# Patient Record
Sex: Female | Born: 1950
Health system: Southern US, Community
[De-identification: ages and names within clinical notes are randomized; demographics above are authoritative.]

## PROBLEM LIST (undated history)

## (undated) DIAGNOSIS — D126 Benign neoplasm of colon, unspecified: Secondary | ICD-10-CM

## (undated) DIAGNOSIS — H6690 Otitis media, unspecified, unspecified ear: Secondary | ICD-10-CM

## (undated) DIAGNOSIS — H3530 Unspecified macular degeneration: Secondary | ICD-10-CM

## (undated) DIAGNOSIS — J45909 Unspecified asthma, uncomplicated: Secondary | ICD-10-CM

## (undated) DIAGNOSIS — G4730 Sleep apnea, unspecified: Secondary | ICD-10-CM

## (undated) DIAGNOSIS — R011 Cardiac murmur, unspecified: Secondary | ICD-10-CM

## (undated) DIAGNOSIS — M1990 Unspecified osteoarthritis, unspecified site: Secondary | ICD-10-CM

## (undated) DIAGNOSIS — K5190 Ulcerative colitis, unspecified, without complications: Secondary | ICD-10-CM

## (undated) DIAGNOSIS — K219 Gastro-esophageal reflux disease without esophagitis: Secondary | ICD-10-CM

## (undated) DIAGNOSIS — D649 Anemia, unspecified: Secondary | ICD-10-CM

## (undated) DIAGNOSIS — I10 Essential (primary) hypertension: Secondary | ICD-10-CM

## (undated) DIAGNOSIS — B9689 Other specified bacterial agents as the cause of diseases classified elsewhere: Secondary | ICD-10-CM

## (undated) DIAGNOSIS — C801 Malignant (primary) neoplasm, unspecified: Secondary | ICD-10-CM

## (undated) DIAGNOSIS — H269 Unspecified cataract: Secondary | ICD-10-CM

## (undated) DIAGNOSIS — R0789 Other chest pain: Secondary | ICD-10-CM

## (undated) DIAGNOSIS — E119 Type 2 diabetes mellitus without complications: Secondary | ICD-10-CM

## (undated) HISTORY — DX: Benign neoplasm of colon, unspecified: D12.6

## (undated) HISTORY — DX: Other chest pain: R07.89

## (undated) HISTORY — PX: TRIGGER FINGER RELEASE: SHX641

## (undated) HISTORY — DX: Unspecified cataract: H26.9

## (undated) HISTORY — DX: Essential (primary) hypertension: I10

## (undated) HISTORY — DX: Sleep apnea, unspecified: G47.30

## (undated) HISTORY — DX: Unspecified asthma, uncomplicated: J45.909

## (undated) HISTORY — PX: TONSILLECTOMY: SUR1361

## (undated) HISTORY — PX: CATARACT EXTRACTION W/ INTRAOCULAR LENS  IMPLANT, BILATERAL: SHX1307

## (undated) HISTORY — DX: Ulcerative colitis, unspecified, without complications: K51.90

## (undated) HISTORY — DX: Gastro-esophageal reflux disease without esophagitis: K21.9

---

## 1998-12-18 HISTORY — PX: CHOLECYSTECTOMY: SHX55

## 1999-04-11 ENCOUNTER — Emergency Department (HOSPITAL_COMMUNITY): Admission: EM | Admit: 1999-04-11 | Discharge: 1999-04-11 | Payer: Self-pay | Admitting: Emergency Medicine

## 1999-04-11 ENCOUNTER — Encounter: Payer: Self-pay | Admitting: Emergency Medicine

## 1999-05-19 ENCOUNTER — Observation Stay (HOSPITAL_COMMUNITY): Admission: RE | Admit: 1999-05-19 | Discharge: 1999-05-20 | Payer: Self-pay | Admitting: General Surgery

## 1999-06-03 ENCOUNTER — Ambulatory Visit (HOSPITAL_COMMUNITY): Admission: RE | Admit: 1999-06-03 | Discharge: 1999-06-03 | Payer: Self-pay | Admitting: General Surgery

## 1999-06-03 ENCOUNTER — Inpatient Hospital Stay (HOSPITAL_COMMUNITY): Admission: EM | Admit: 1999-06-03 | Discharge: 1999-06-07 | Payer: Self-pay

## 1999-06-04 ENCOUNTER — Encounter: Payer: Self-pay | Admitting: *Deleted

## 2004-10-10 ENCOUNTER — Ambulatory Visit (HOSPITAL_COMMUNITY): Admission: RE | Admit: 2004-10-10 | Discharge: 2004-10-10 | Payer: Self-pay | Admitting: Internal Medicine

## 2004-10-26 ENCOUNTER — Ambulatory Visit: Payer: Self-pay | Admitting: Gastroenterology

## 2004-11-08 ENCOUNTER — Ambulatory Visit: Payer: Self-pay | Admitting: Gastroenterology

## 2004-11-15 ENCOUNTER — Ambulatory Visit: Payer: Self-pay | Admitting: Gastroenterology

## 2004-12-07 ENCOUNTER — Ambulatory Visit: Payer: Self-pay | Admitting: Gastroenterology

## 2005-11-30 ENCOUNTER — Ambulatory Visit (HOSPITAL_COMMUNITY): Admission: RE | Admit: 2005-11-30 | Discharge: 2005-11-30 | Payer: Self-pay | Admitting: Orthopedic Surgery

## 2005-11-30 HISTORY — PX: DE QUERVAIN'S RELEASE: SHX1439

## 2007-01-16 ENCOUNTER — Ambulatory Visit: Payer: Self-pay | Admitting: Gastroenterology

## 2007-01-16 LAB — CONVERTED CEMR LAB
Basophils Absolute: 0.1 10*3/uL (ref 0.0–0.1)
Basophils Relative: 1 % (ref 0.0–1.0)
Eosinophils Absolute: 0.2 10*3/uL (ref 0.0–0.6)
Eosinophils Relative: 2.2 % (ref 0.0–5.0)
Folate: 7.9 ng/mL
HCT: 33.9 % — ABNORMAL LOW (ref 36.0–46.0)
Hemoglobin: 11.2 g/dL — ABNORMAL LOW (ref 12.0–15.0)
Iron: 26 ug/dL — ABNORMAL LOW (ref 42–145)
Lymphocytes Relative: 19.1 % (ref 12.0–46.0)
MCHC: 33.1 g/dL (ref 30.0–36.0)
MCV: 69.1 fL — ABNORMAL LOW (ref 78.0–100.0)
Monocytes Absolute: 0.6 10*3/uL (ref 0.2–0.7)
Monocytes Relative: 6.3 % (ref 3.0–11.0)
Neutro Abs: 6.3 10*3/uL (ref 1.4–7.7)
Neutrophils Relative %: 71.4 % (ref 43.0–77.0)
Platelets: 333 10*3/uL (ref 150–400)
RBC: 4.9 M/uL (ref 3.87–5.11)
RDW: 15.8 % — ABNORMAL HIGH (ref 11.5–14.6)
Saturation Ratios: 5.4 % — ABNORMAL LOW (ref 20.0–50.0)
Transferrin: 346.4 mg/dL (ref >=212.0)
Vitamin B-12: 376 pg/mL (ref 211–911)
WBC: 8.9 10*3/uL (ref 4.5–10.5)

## 2007-01-22 ENCOUNTER — Encounter (INDEPENDENT_AMBULATORY_CARE_PROVIDER_SITE_OTHER): Payer: Self-pay | Admitting: Gastroenterology

## 2007-01-28 ENCOUNTER — Ambulatory Visit: Payer: Self-pay | Admitting: Internal Medicine

## 2007-02-07 ENCOUNTER — Ambulatory Visit: Payer: Self-pay | Admitting: Internal Medicine

## 2007-03-18 ENCOUNTER — Ambulatory Visit: Payer: Self-pay | Admitting: Internal Medicine

## 2007-03-18 LAB — CONVERTED CEMR LAB
Basophils Absolute: 0.1 10*3/uL (ref 0.0–0.1)
Basophils Relative: 0.9 % (ref 0.0–1.0)
Eosinophils Absolute: 0.2 10*3/uL (ref 0.0–0.6)
Eosinophils Relative: 2.2 % (ref 0.0–5.0)
HCT: 39.9 % (ref 36.0–46.0)
Hemoglobin: 13.3 g/dL (ref 12.0–15.0)
Lymphocytes Relative: 22.2 % (ref 12.0–46.0)
MCHC: 33.4 g/dL (ref 30.0–36.0)
MCV: 74 fL — ABNORMAL LOW (ref 78.0–100.0)
Monocytes Absolute: 0.6 10*3/uL (ref 0.2–0.7)
Monocytes Relative: 6.9 % (ref 3.0–11.0)
Neutro Abs: 6.1 10*3/uL (ref 1.4–7.7)
Neutrophils Relative %: 67.8 % (ref 43.0–77.0)
Platelets: 291 10*3/uL (ref 150–400)
RBC: 5.38 M/uL — ABNORMAL HIGH (ref 3.87–5.11)
RDW: 20.5 % — ABNORMAL HIGH (ref 11.5–14.6)
WBC: 9 10*3/uL (ref 4.5–10.5)

## 2007-05-15 ENCOUNTER — Ambulatory Visit: Payer: Self-pay | Admitting: Internal Medicine

## 2007-05-15 LAB — CONVERTED CEMR LAB: Tissue Transglutaminase Ab, IgA: <3 units (ref <5)

## 2007-05-16 ENCOUNTER — Encounter: Payer: Self-pay | Admitting: Internal Medicine

## 2007-05-16 ENCOUNTER — Ambulatory Visit: Payer: Self-pay | Admitting: Internal Medicine

## 2007-05-16 DIAGNOSIS — K449 Diaphragmatic hernia without obstruction or gangrene: Secondary | ICD-10-CM | POA: Insufficient documentation

## 2007-05-24 ENCOUNTER — Ambulatory Visit (HOSPITAL_COMMUNITY): Admission: RE | Admit: 2007-05-24 | Discharge: 2007-05-24 | Payer: Self-pay | Admitting: Internal Medicine

## 2007-06-12 ENCOUNTER — Ambulatory Visit: Payer: Self-pay | Admitting: Internal Medicine

## 2007-06-12 LAB — CONVERTED CEMR LAB
Basophils Absolute: 0 10*3/uL (ref 0.0–0.1)
Basophils Relative: 0.5 % (ref 0.0–1.0)
Eosinophils Absolute: 0.1 10*3/uL (ref 0.0–0.6)
Eosinophils Relative: 1 % (ref 0.0–5.0)
Ferritin: 204.2 ng/mL (ref 10.0–291.0)
HCT: 43.1 % (ref 36.0–46.0)
Hemoglobin: 14.3 g/dL (ref 12.0–15.0)
Iron: 49 ug/dL (ref 42–145)
Lymphocytes Relative: 18 % (ref 12.0–46.0)
MCHC: 33.2 g/dL (ref 30.0–36.0)
MCV: 80 fL (ref 78.0–100.0)
Monocytes Absolute: 0.4 10*3/uL (ref 0.2–0.7)
Monocytes Relative: 5.7 % (ref 3.0–11.0)
Neutro Abs: 5.7 10*3/uL (ref 1.4–7.7)
Neutrophils Relative %: 74.8 % (ref 43.0–77.0)
Platelets: 234 10*3/uL (ref 150–400)
RBC: 5.4 M/uL — ABNORMAL HIGH (ref 3.87–5.11)
RDW: 17.1 % — ABNORMAL HIGH (ref 11.5–14.6)
Saturation Ratios: 14.7 % — ABNORMAL LOW (ref 20.0–50.0)
Transferrin: 238.7 mg/dL (ref >=212.0)
WBC: 7.6 10*3/uL (ref 4.5–10.5)

## 2008-04-10 DIAGNOSIS — Z862 Personal history of diseases of the blood and blood-forming organs and certain disorders involving the immune mechanism: Secondary | ICD-10-CM | POA: Insufficient documentation

## 2008-04-10 DIAGNOSIS — G4730 Sleep apnea, unspecified: Secondary | ICD-10-CM | POA: Insufficient documentation

## 2008-04-10 DIAGNOSIS — K5100 Ulcerative (chronic) pancolitis without complications: Secondary | ICD-10-CM | POA: Insufficient documentation

## 2008-12-10 ENCOUNTER — Emergency Department (HOSPITAL_COMMUNITY): Admission: EM | Admit: 2008-12-10 | Discharge: 2008-12-10 | Payer: Self-pay | Admitting: Family Medicine

## 2009-09-22 ENCOUNTER — Encounter (INDEPENDENT_AMBULATORY_CARE_PROVIDER_SITE_OTHER): Payer: Self-pay | Admitting: *Deleted

## 2009-12-16 ENCOUNTER — Ambulatory Visit (HOSPITAL_BASED_OUTPATIENT_CLINIC_OR_DEPARTMENT_OTHER): Admission: RE | Admit: 2009-12-16 | Discharge: 2009-12-16 | Payer: Self-pay | Admitting: Orthopedic Surgery

## 2009-12-16 HISTORY — PX: CARPAL TUNNEL RELEASE: SHX101

## 2010-04-18 ENCOUNTER — Telehealth: Payer: Self-pay | Admitting: Internal Medicine

## 2011-01-17 NOTE — Letter (Signed)
Summary: Colonoscopy Letter  [facility]  [address] [place], [place] [address]   Phone: [phone]  Fax: [phone]      September 22, 2009 MRN: [id number]   Shawna Gonzalez [address] [place], [place]  [address]   Dear Ms. Bourdeau,   According to your medical record, it is time for you to schedule a Colonoscopy. The American Cancer Society recommends this procedure as a method to detect early colon cancer. Patients with a family history of colon cancer, or a personal history of colon polyps or inflammatory bowel disease are at increased risk.  This letter has beeen generated based on the recommendations made at the time of your procedure. If you feel that in your particular situation this may no longer apply, please contact our office.  Please call our office at [phone] to schedule this appointment or to update your records at your earliest convenience.  Thank you for cooperating with [place] to provide you with the very best care possible.   Sincerely,  Hedwig Morton. Juanda Chance, M.D.  [facility] [phone]

## 2011-01-17 NOTE — Procedures (Signed)
Summary: Gastroenterology Egd  Gastroenterology Egd   Imported By: June McMurray CMA 04/16/2008 13:53:27  _____________________________________________________________________  External Attachment:    Type:   Image     Comment:   External Document

## 2011-01-17 NOTE — Procedures (Signed)
Summary: Gastroenterology Col  Gastroenterology Col   Imported By: June McMurray CMA 04/16/2008 13:52:38  _____________________________________________________________________  External Attachment:    Type:   Image     Comment:   External Document

## 2011-01-17 NOTE — Progress Notes (Signed)
Summary: Schedule colonoscopy  Phone Note Outgoing Call Call back at [facility] Phone [phone]   Call placed by: Harlow Mares CMA Duncan Dull),  Apr 18, 2010 3:38 PM Call placed to: Patient Summary of Call: pt due for colonoscopy, lm with a female she will give her a message to call back and schedule a colonoscopy.  Initial call taken by: Harlow Mares CMA Duncan Dull),  Apr 18, 2010 3:39 PM  Follow-up for Phone Call        left message with female to have patient call back. we will send patient another letter.  Follow-up by: Harlow Mares CMA Duncan Dull),  Apr 29, 2010 2:43 PM

## 2011-03-20 LAB — POCT HEMOGLOBIN-HEMACUE: Hemoglobin: 14.1 g/dL (ref 12.0–15.0)

## 2011-03-31 ENCOUNTER — Ambulatory Visit (HOSPITAL_COMMUNITY): Payer: PRIVATE HEALTH INSURANCE | Attending: Internal Medicine

## 2011-03-31 DIAGNOSIS — M810 Age-related osteoporosis without current pathological fracture: Secondary | ICD-10-CM | POA: Insufficient documentation

## 2011-05-02 NOTE — Assessment & Plan Note (Signed)
[facility]                         GASTROENTEROLOGY OFFICE NOTE   MATSUKO, KRETZ                   MRN:          [id number]  DATE:06/12/2007                            DOB:          10-15-1951    Ms. Shawna Gonzalez is a 60 year old white female with iron deficiency  anemia. We have recently done a work up on her which included upper  endoscopy and small bowel biopsy. There is no evidence of malabsorption.  No evidence of sprue. She had a small hiatal hernia. Patient has  received not only oral iron but also iron infusion on May 27, 2007. She  denies any rectal bleeding. There is a questionable history of  ulcerative colitis on colonoscopy by Dr. Corinda Gubler in November 2005.  Interestingly patient had a colonoscopy at that time because of acute  illness resulting in diarrhea. CT scan of the abdomen at that time  showed diffuse thickening of the colon suggestive of either infectious  colitis, or inflammatory bowel disease. On colonoscopy she had inflamed  colonic mucosa which Dr. Corinda Gubler labeled as ulcerative colitis but the  actual biopsies did not show any inflammation. She since then has not  had any problems with her lower GI tract. Her bowel habits are rather  frequent, having 3 to 4 formed bowel movements a day, sometimes loose  stools. She attributes this to cholecystectomy, post cholecystectomy  diarrhea. She denies any rectal bleeding.   PHYSICAL EXAMINATION:  Blood pressure 142/80, pulse 68, and weight 182  pounds. She was alert, oriented, no distress.  LUNGS: Clear to auscultation.  COR: Normal S1, S2.  ABDOMEN: Soft, obese, tender in left lower quadrant. Liver edge at  costal margin.  RECTAL EXAM: Reveals no stool in the vault. Mucus was Hemoccult  negative.   IMPRESSION:  A 60 year old white female with iron deficiency anemia  which was partly caused be being a blood donor. We are looking for other  factors causing her iron  deficiency. So far I have not found any. We  will rule out ulcerative colitis.   PLAN:  Repeat CBC today, as well as iron studies, and if her iron and  blood counts are normal today, no further evaluation is needed. If they  are still low I would probably consider doing colonoscopy and rebiopsy  her to make sure she does not have ulcerative colitis.     Hedwig Morton. Juanda Chance, MD  Electronically Signed    DMB/MedQ  DD: 06/12/2007  DT: 06/12/2007  Job #: [id number]   cc:   Loraine Leriche A. Perini, M.D.

## 2011-05-02 NOTE — Assessment & Plan Note (Signed)
[facility]                         GASTROENTEROLOGY OFFICE NOTE   Shawna Gonzalez, Shawna Gonzalez                   MRN:          [id number]  DATE:05/15/2007                            DOB:          [birth year]    Shawna Gonzalez is a very nice 60 year old patient of Dr. Waynard Edwards who has  iron deficiency.  We saw her in February 2008 and asked her to be a  blood donor to avoid further iron deficiency.  She continued Repliva  iron supplements daily, but her iron saturation rose only from 5% up to  9%.  Her Hemoccult cards were never done as we requested.  Her  hemoglobin has come up from 11 up to 13; it was 11.5 and hematocrit 36.4  with a low MCV of 71.3 earlier, but most recently on Apr 19, 2007  hemoglobin was up to 13.8, hematocrit 41.5 with MCV of up to 75.6.  The  patient has crampy abdominal pain and frequent stools.  Last endoscopic  exam and colonoscopy was done in November 2005 and showed small gastric  polyps and a question of a colitis of the left colon which did not  demonstrate any inflammatory changes on the biopsies.   MEDICATIONS:  1. Fosamax 70 mg weekly.  2. Travatan 1 p.o. daily.  3. Atenolol 1 p.o. daily.  4. Vitamin D 50,000 units 3 times a week.  5. Over the counter iron one a day.  6. Nexium 40 mg a day.  7. Aspirin 81 mg p.o. daily.   PHYSICAL EXAMINATION:  Blood pressure 140/84, pulse 72 and weight 182  pounds.  She has lost about 20 pounds since January of this year.  She  appeared pale, overweight, alert and oriented.  LUNGS:  Clear to auscultation.  COR:  With normal S1, normal S2.  ABDOMEN:  Protuberant but soft, post cholecystectomy scars, no  tenderness, liver edge at costal margin.  Lower abdomen was  unremarkable.  Minimal tenderness in left lower quadrant.  RECTAL:  Today shows soft Hemoccult negative stool.   IMPRESSION:  A 60 year old white female with persistent iron deficiency  but Hemoccult negative stools.  She at  one point was a blood donor and  we have attributed the severe iron deficiency to the phlebotomies, but  she really has not been able to bring up her iron saturation on iron  supplements because either she needs to take her iron for longer period  of time or she possibly has some absorption of the iron.   PLAN:  1. To rule our celiac sprue or some other villous atrophy of her small      intestine we would try to do a small bowel biopsy and also do a      tissue transglutaminase level.  2. Upper endoscopy with small bowel biopsy scheduled.  3. Depending on the small bowel biopsy, she will likely need an iron      infusion.  4. Depending on the upper endoscopy and small bowel biopsy consider      repeat colonoscopy and biopsies from the colon.     Hedwig Morton. Juanda Chance, MD  Electronically Signed    DMB/MedQ  DD: 05/15/2007  DT: 05/15/2007  Job #: [id number]   cc:   Loraine Leriche A. Perini, M.D.

## 2011-05-05 NOTE — Assessment & Plan Note (Signed)
[facility]                         GASTROENTEROLOGY OFFICE NOTE   Shawna Gonzalez, SPAN                   MRN:          [id number]  DATE:02/07/2007                            DOB:          Jan 07, 1951    Shawna Gonzalez is a 60 year old black female seen in the absence of Dr.  Corinda Gubler for evaluation of severe anemia, with microcytic indices.  Hemoglobin on January 28, 2007 was 11.2, hematocrit 33.9, with an MCV  of 69, iron saturation 5.4%, with normal B12 and folate levels.  Upper  abdominal ultrasound on January 28, 2007 showed post cholecystectomy  state, otherwise normal exam.  The patient has been followed by Dr.  Corinda Gubler for several years.  She has a history of acute colitis in 2005  which showed on CT scan of the abdomen on October 10, 2004 as defuse  wall thickening along the left colon.  Subsequent colonoscopy on  November 15, 2004 showed question of left-sided colitis.  Biopsies  however did not show any evidence of colitis.  They showed normal  mucosa.  The patient since then has not had any lower GI problems.  Her  level of energy has been slowing decreasing.  Upper endoscopy on  November 15, 2004 showed small hiatal hernia.  The patient gives blood  frequently.  Over past 5 to 6 years she has given about 9 to 10 units of  blood.  The last time she gave 1 pint of blood was first week of  November 2007, prior to that 6 months ago.  When she went to give blood  again in January she was turned down because of the anemia.  Her last  menstrual period was about 2 years ago.  She has never been anemic and  never took any iron pills until several weeks ago when Dr. Corinda Gubler asked  her to start taking Repliva 1 p.o. daily.  She has had some craving for  ice.  She denies dysphagia, odynophagia, abdominal pain.  Her bowel  habits are regular.  There is no family history of colon cancer.  Stool  for ONP several weeks ago was negative.   MEDICATIONS:  1. Aspirin 81 mg p.o. daily.  2. Nexium 40 mg p.o. daily.  3. Caltrate.   PHYSICAL EXAMINATION:  Blood pressure 102/68, pulse 72, weight 198  pounds.  She was alert and oriented, in no distress.  No kyllosis.  Tongue was papillated.  Sclerae nonicteric.  LUNGS:  Clear to auscultation.  CARDIAC:  Quiet precordium, normal S1 and S2.  ABDOMEN:  Soft, nontender, obese.  Liver edge at costal margin.  Normoactive bowel sounds.  RECTAL EXAM:  With normoactive tone, hemoccult negative stool.   IMPRESSION:  A 60 year old black female with severe microcytic anemia  but hemoccult negative stool.  I think her anemia is due to excessive  blood donation which she has done 9 to 10 times in the last several  years.  She was finally turned down because of low blood count.  She is  postmenopausal but might have had chronic iron deficiency prior to her  menopause.  There was a questionable  history of acute colitis which  however has resolved and was never confirmed on biopsies.   PLAN:  1. I have asked the patient not to be a blood donner  2. Continue Repliva 1 p.o. daily.  3. Repeat H&H in 6 weeks.  4. Hemoccult cards.  If these are positive she will need another      endoscopy and colonoscopy.  If they are negative I would just      continue to follow her H&H.     Hedwig Morton. Juanda Chance, MD  Electronically Signed    DMB/MedQ  DD: 02/07/2007  DT: 02/07/2007  Job #: [id number]   cc:   Kerry Kass, M.D. [facility] A. Perini, M.D.

## 2011-05-05 NOTE — Assessment & Plan Note (Signed)
[facility]                         GASTROENTEROLOGY OFFICE NOTE   Shawna Gonzalez, Shawna Gonzalez                   MRN:          [id number]  DATE:01/16/2007                            DOB:          1951/01/06    This patient of Dr. Laurey Morale was referred because of some recent  findings of anemia with a hemoglobin dropping from 14 to 11 over the  past year. It is interesting that she does not see any evidence of blood  and I worked her up quite extensively several years ago with an upper  endoscopy and a colonoscopic examination. The colonoscopy revealed some  questionable slight mild inflammatory process involving the left colon.  The biopsies were not consistent with any real significant pathology.  The upper endoscopy revealed a small hiatal hernia approximately 2 cm  with a questionable short segment Barrett's. The stomach revealed some  small benign antral polyps but otherwise was negative.   PHYSICAL EXAMINATION:  VITAL SIGNS: Today she weighed 207. Blood  pressure 120/70, pulse 78 and regular.  NECK: Carotid upstrokes are unremarkable.   IMPRESSION:  1. Anemia of questionable etiology.  2. Allergies.  3. History of sleep apnea.  4. Status post cholecystectomy.  5. Gastric polyps.  6. Questionable colitis.  7. GERD.  8. Moderate obesity.   RECOMMENDATIONS:  Get stools for screening. Get CBC, serum iron binding  capacity, B12 and folate levels. She is presently taking iron, we will  discontinue this 5 days prior to the Hemoccults. If there is evidence of  positive stools or significant iron deficiency, then I think we probably  will be expected to evaluate her endoscopically to see if we can find  any significant evidence for her anemia. She also might need a capsule  endoscopy to be complete if the other endoscopic studies are not  definitive.     Ulyess Mort, MD  Electronically Signed    SML/MedQ  DD: 01/16/2007  DT: 01/16/2007   Job #: [id number]   cc:   Loraine Leriche A. Perini, M.D.

## 2011-05-05 NOTE — Op Note (Signed)
NAMEM[facility]land Kitchen  Shawna Gonzalez, Shawna Gonzalez NO.:  [id number]   MEDICAL RECORD NO.:  [id number]          PATIENT TYPE:  AMB   LOCATION:  DAY                          FACILITY:  [facility]   PHYSICIAN:  Vania Rea. Supple, M.D.  DATE OF BIRTH:  Nov 18, 1951   DATE OF PROCEDURE:  11/30/2005  DATE OF DISCHARGE:                                 OPERATIVE REPORT   PREOPERATIVE DIAGNOSIS:  Right wrist De Quervain's stenosing tenosynovitis.   POSTOPERATIVE DIAGNOSIS:  Right wrist De Quervain's stenosing tenosynovitis.   PROCEDURE:  A right wrist dorsal compartment release.   SURGEON:  Vania Rea. Supple, M.D.   Threasa HeadsFrench Ana A. Shuford, P.A.-C.   ANESTHESIA:  An IV regional.   TOURNIQUET TIME:  Less than 30 minutes.   ESTIMATED BLOOD LOSS:  Minimal.   DRAINS:  None.   HISTORY:  [name] is a 59 year old female who has been followed for  right wrist pain secondary to a stenosing tenosynovitis of the first dorsal  compartment.  Her symptoms have been refractory to multiple and prolonged  attempts at conservative management.  Due to her ongoing pain and functional  limitations, she is brought to the operating room at this time for a planned  right wrist surgery, as described below.   I preoperatively counseled [name] on treatment options as well as  risks versus benefits thereof.  Possible surgical complications of bleeding,  infection, neurovascular injury, persistent pain, loss of motion, recurrence  of symptoms, and possible need for additional surgery are reviewed.  She  understands and accepts and agrees with our planned procedure.   PROCEDURE IN DETAIL:  After undergoing routing preoperative evaluation, the  patient received prophylactic antibiotics and was placed supine on the  operating room table where an IV regional anesthetic was established under  tourniquet control into the right upper extremity.  The arm was then  sterilely prepped and draped in a standard fashion.   The transverse 3 cm  incision was then made over the apex of the radial styloid.  Sharp  dissection carried down through the skin, and bipolar electrocautery was  then used for hemostasis.  A combination of sharp and blunt dissection was  then used to divide the subcutaneous tissue with care taken to protect the  neurovascular structures.  Dissection carried deeply down to the apex of the  styloid, where the sheath of the first dorsal compartment was then  identified.  This was then divided longitudinally with  a 15 blade with care  taken to confirm that the sheath had been properly released both proximally  and distally.  The contents of the first dorsal compartment were then  identified, and we confirmed that there were no accessory compartments and  that all of the tendons within the first dorsal compartment had been  appropriately decompressed.  The wound was then irrigated.  Hemostasis was  obtained.  The deep subcu layer was closed with two simple 3-0 Vicryl  sutures.  Intra-articular 3-0 Monocryl was used to close the  skin.  Then 0.25% plain Marcaine was then injected along the skin incision.  Steri-Strips applied.  A bulky dry dressing placed over the right wrist.  The tourniquet was let down, and the patient was returned to the recovery  room in stable condition.      Vania Rea. Supple, M.D.  Electronically Signed     KMS/MEDQ  D:  11/30/2005  T:  11/30/2005  Job:  [id number]

## 2011-06-07 ENCOUNTER — Other Ambulatory Visit: Payer: Self-pay | Admitting: Dermatology

## 2011-12-26 ENCOUNTER — Encounter: Payer: Self-pay | Admitting: Internal Medicine

## 2012-01-09 ENCOUNTER — Ambulatory Visit (AMBULATORY_SURGERY_CENTER): Payer: PRIVATE HEALTH INSURANCE | Admitting: *Deleted

## 2012-01-09 VITALS — Ht 59.0 in | Wt 206.0 lb

## 2012-01-09 DIAGNOSIS — Z1211 Encounter for screening for malignant neoplasm of colon: Secondary | ICD-10-CM

## 2012-01-09 DIAGNOSIS — K5100 Ulcerative (chronic) pancolitis without complications: Secondary | ICD-10-CM

## 2012-01-09 MED ORDER — PEG-KCL-NACL-NASULF-NA ASC-C 100 G PO SOLR: ORAL | Status: DC

## 2012-01-10 ENCOUNTER — Encounter: Payer: Self-pay | Admitting: Internal Medicine

## 2012-01-23 ENCOUNTER — Ambulatory Visit (AMBULATORY_SURGERY_CENTER): Payer: PRIVATE HEALTH INSURANCE | Admitting: Internal Medicine

## 2012-01-23 ENCOUNTER — Encounter: Payer: Self-pay | Admitting: Internal Medicine

## 2012-01-23 DIAGNOSIS — D126 Benign neoplasm of colon, unspecified: Secondary | ICD-10-CM

## 2012-01-23 DIAGNOSIS — K621 Rectal polyp: Secondary | ICD-10-CM

## 2012-01-23 DIAGNOSIS — K5100 Ulcerative (chronic) pancolitis without complications: Secondary | ICD-10-CM

## 2012-01-23 DIAGNOSIS — K620 Anal polyp: Secondary | ICD-10-CM

## 2012-01-23 DIAGNOSIS — Z1211 Encounter for screening for malignant neoplasm of colon: Secondary | ICD-10-CM

## 2012-01-23 HISTORY — PX: COLONOSCOPY WITH PROPOFOL: SHX5780

## 2012-01-23 MED ORDER — SODIUM CHLORIDE 0.9 % IV SOLN: 500.0000 mL | INTRAVENOUS | Status: DC

## 2012-01-23 NOTE — Op Note (Signed)
[facility] [address] [place], [place]  [address]  COLONOSCOPY PROCEDURE REPORT  PATIENT:  Shawna, Gonzalez  MR#:  [id number] BIRTHDATE:  23-Apr-1951, 61 yrs. old  GENDER:  female ENDOSCOPIST:  Hedwig Morton. Juanda Chance, MD REF. BY:  Rodrigo Ran, M.D. PROCEDURE DATE:  01/23/2012 PROCEDURE:  Colonoscopy with biopsy ASA CLASS:  Class II INDICATIONS:  last colon 10/2004 ? U.Colitis- biopsies showed normal mucosa MEDICATIONS:   MAC sedation, administered by CRNA, propofol (Diprivan) 300 mg  DESCRIPTION OF PROCEDURE:   After the risks and benefits and of the procedure were explained, informed consent was obtained. Digital rectal exam was performed and revealed no rectal masses. The LB 180AL [id number] endoscope was introduced through the anus and advanced to the cecum, which was identified by both the appendix and ileocecal valve.  The quality of the prep was good, using MoviPrep.  The instrument was then slowly withdrawn as the colon was fully examined. <<PROCEDUREIMAGES>>  FINDINGS:  Two polyps were found. at 35 cm and in the rectum 3-6 mm polyps The polyp was removed using cold biopsy forceps. Polyp was snared without cautery. Retrieval was successful (see image1, image6, image8, image7, and image9). snare polyp  Mild diverticulosis was found in the sigmoid colon (see image5).  This was otherwise a normal examination of the colon (see image2, image3, image4, and image10).   Retroflexed views in the rectum revealed no abnormalities.    The scope was then withdrawn from the patient and the procedure completed.  COMPLICATIONS:  None ENDOSCOPIC IMPRESSION: 1) Two polyps 2) Mild diverticulosis in the sigmoid colon 3) Otherwise normal examination no evidence of ulcerative colitis RECOMMENDATIONS: 1) Await pathology results 2) High fiber diet.  REPEAT EXAM:  In 5 - 7 year(s) for.  ______________________________ Hedwig Morton. Juanda Chance, MD  CC:  n. eSIGNED:   Hedwig Morton. Merrianne Mccumbers at  01/23/2012 12:24 PM  Stark Klein, [id number]

## 2012-01-23 NOTE — Patient Instructions (Signed)
FOLLOW DISCHARGE INSTRUCTIONS (BLUE & GREEN SHEETS).   Information on polyps, diverticulosis, & high fiber diet given to you.

## 2012-01-23 NOTE — Progress Notes (Signed)
Patient did not experience any of the following events: a burn prior to discharge; a fall within the facility; wrong site/side/patient/procedure/implant event; or a hospital transfer or hospital admission upon discharge from the facility. (G8907) Patient did not have preoperative order for IV antibiotic SSI prophylaxis. (G8918)  

## 2012-01-24 ENCOUNTER — Telehealth: Payer: Self-pay | Admitting: *Deleted

## 2012-01-24 NOTE — Telephone Encounter (Signed)
  Follow up Call-  Call back number 01/23/2012  Post procedure Call Back phone  # [phone]---  Permission to leave phone message Yes     Patient questions:  Do you have a fever, pain , or abdominal swelling? no Pain Score  0 *  Have you tolerated food without any problems? yes  Have you been able to return to your normal activities? yes  Do you have any questions about your discharge instructions: Diet   no Medications  no Follow up visit  no  Do you have questions or concerns about your Care? no  Actions: * If pain score is 4 or above: No action needed, pain <4.

## 2012-01-29 ENCOUNTER — Encounter: Payer: Self-pay | Admitting: Internal Medicine

## 2012-05-03 ENCOUNTER — Encounter (HOSPITAL_COMMUNITY): Admission: RE | Admit: 2012-05-03 | Payer: PRIVATE HEALTH INSURANCE | Source: Ambulatory Visit

## 2012-05-22 ENCOUNTER — Encounter: Payer: Self-pay | Admitting: Internal Medicine

## 2012-05-22 ENCOUNTER — Ambulatory Visit (INDEPENDENT_AMBULATORY_CARE_PROVIDER_SITE_OTHER): Payer: PRIVATE HEALTH INSURANCE | Admitting: Internal Medicine

## 2012-05-22 VITALS — BP 138/88 | HR 87 | Temp 98.0°F | Ht <= 58 in | Wt 222.0 lb

## 2012-05-22 DIAGNOSIS — R05 Cough: Secondary | ICD-10-CM | POA: Insufficient documentation

## 2012-05-22 DIAGNOSIS — R053 Chronic cough: Secondary | ICD-10-CM | POA: Insufficient documentation

## 2012-05-22 DIAGNOSIS — R059 Cough, unspecified: Secondary | ICD-10-CM

## 2012-05-22 MED ORDER — FLUTICASONE PROPIONATE 50 MCG/ACT NA SUSP: 2.0000 | Freq: Every day | NASAL | Status: DC

## 2012-05-22 NOTE — Assessment & Plan Note (Signed)
Cough is from sinus drainage, acid reflux, possible  asthma,  All of this is working together to cause cyclical cough  Otherwise called LPR cough - talking a lot makes this worse Follow all instructions 100% of the time  #Sinus drainage  - start/continue netti pot daily - continue allegra as you have always done - start nasal steroid generic fluticasone inhaler 2 squirts each nostril daily as advised (nurse will do script)  #Possible Acid Reflux  - continue nexium like you have always done   - take diet sheet from [place] - avoid colas, spices, cheeses, spirits, red meats, beer, chocolates, fried foods etc.,   - sleep with head end of bed elevated  - eat small frequent meals  - do not go to bed for 3 hours after last meal  #Possible Asthma   - stop symbicort for 2 weeks and then do methacholine challenge test (you cannot be pregnant or have heart disease for this test)  -  I will call you with results  #Cyclical cough  - please choose 2-3 days and observe complete voice rest - no talking or whispering  - at all times there  there is urge to cough, drink water or swallow or sip on throat lozenge  #Followup - I will see you in 4-6 weeks.  - any problems call or come sooner - Cough score at followup

## 2012-05-22 NOTE — Progress Notes (Signed)
Subjective:    Patient ID: Shawna Gonzalez, female    DOB: 10-Jul-1951, 61 y.o.   MRN: [id number]  HPI  61 year female. Body mass index is 46.40 kg/(m^2).  reports that she has never smoked. She has never used smokeless tobacco.  PCP is Ezequiel Kayser, MD   IOV 05/22/2012  Reports chronic cough. Says that every nov 2012 that she gets sinusitis and this turns to asthma and cough but always resolve in 6-8 weeks but since Nov 2012 episode cough not resolved.  Cough present 24/7 day and night per report. Constantly cough. Rates as moderate-sever cough. Cough triggered by nothing and can happen any time randomly. Cough improved by hydrocodone cough syrup and provides only partial relief during sleep.. Cough is of BARKING quality in office.   There is associated post nasal sinus drainage that is also new since nov 2012 but there is background hx of intermitent sinus issues. On chronic claritin.  Used to see someone in [facility] and at one point was recommended surgery for DNS.   There is associated hx of Ulcerative colitis (inactive x 11 years) hx of hiatal hernia (Dr Diamond Nickel) and GERD but she says it is a problem only when she eats late at night, or greasy food or spicy food. However, there was chest pains in Jan 2013 and this improved with PPI; recently dose cut down and chest pain is improved but still present a 2 times per week. She is on anti-hypertensive losartan currently but she was on lisinopril that was stopped feb 2013 but cough did not improve.   No definite asthma diagnosis but says clinically suspected x 6-7 years which she takes 50% of the time but does not help cough.   Hx of allergy skin test x 20 years ago and reportedly postiive for dust and mold. Curently denies spring allergy symptoms.   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[employer]  Hx of negative cardiac stress test feb 2013    Dr Gretta Cool Reflux Symptom Index (> 13-15 suggestive of LPR cough) 0 -> 5  =  none ->severe  problem  Hoarseness of problem with voice 3  Clearing  Of Throat 2  Excess throat mucus or feeling of post nasal drip 3  Difficulty swallowing food, liquid or tablets 1  Cough after eating or lying down 1  Breathing difficulties or choking episodes 5  Troublesome or annoying cough 0  Sensation of something sticking in throat or lump in throat 0  Heartburn, chest pain, indigestion, or stomach acid coming up   TOTAL 16     Kouffman Reflux v Neurogenic Cough Differentiator Reflux Neurogenic Comments  Do you awaken from a sound sleep coughing violently?                            With trouble breathing? Yes    Do you have choking episodes when you cannot  Get enough air, gasping for air ?                  Do you usually cough when you lie down into  The bed, or when you just lie down to rest ?                          Yes    Do you usually cough after meals or eating?  Do you cough when (or after) you bend over?        Do you more-or-less cough all day long?  Yes   Does change of temperature make you cough?  Yes   Does laughing or chuckling cause you to cough?  YEs   Do fumes (perfume, automobile fumes, burned  Toast, etc.,) cause you to cough ?       Yes   Does speaking, singing, or talking on the phone cause you to cough   ?                Yes   Total 2 5      Past Medical History  Diagnosis Date  . Osteoporosis   . Glaucoma   . GERD (gastroesophageal reflux disease)   . Ulcerative colitis, chronic   . Adenomatous colon polyp   . Sleep apnea   . Asthma      Family History  Problem Relation Age of Onset  . Colon polyps Mother   . Colon polyps Father   . Colon polyps Brother   . Heart disease Mother      History   Social History  . Marital Status: Single    Spouse Name: N/A    Number of Children: N/A  . Years of Education: N/A   Occupational History  . customer service [employer]   Social History Main Topics  . Smoking status: Never Smoker   .  Smokeless tobacco: Never Used  . Alcohol Use: No  . Drug Use: No  . Sexually Active: Not on file   Other Topics Concern  . Not on file   Social History Narrative  . No narrative on file     Allergies  Allergen Reactions  . Sulfa Antibiotics Rash     Outpatient Prescriptions Prior to Visit  Medication Sig Dispense Refill  . aspirin 81 MG chewable tablet Chew 81 mg by mouth daily.      . Pediatric Multivit-Minerals-C (MULTIVITAMIN/IRON) 60 MG CHEW Chew 1 tablet by mouth daily.      . timolol (BETIMOL) 0.5 % ophthalmic solution Place 1 drop into both eyes daily.      . Vitamin D, Ergocalciferol, (DRISDOL) 50000 UNITS CAPS Take 50,000 Units by mouth 2 (two) times a week.       . zoledronic acid (RECLAST) 5 MG/100ML SOLN Inject 5 mg into the vein once. Once a year            Review of Systems  Constitutional: Negative for fever and unexpected weight change.  HENT: Positive for sore throat and sneezing. Negative for ear pain, nosebleeds, congestion, rhinorrhea, trouble swallowing, dental problem, postnasal drip and sinus pressure.   Eyes: Negative for redness and itching.  Respiratory: Positive for cough and shortness of breath. Negative for chest tightness and wheezing.   Cardiovascular: Positive for leg swelling. Negative for palpitations.  Gastrointestinal: Negative for nausea and vomiting.  Genitourinary: Negative for dysuria.  Musculoskeletal: Negative for joint swelling.  Skin: Negative for rash.  Neurological: Negative for headaches.  Hematological: Does not bruise/bleed easily.  Psychiatric/Behavioral: Negative for dysphoric mood. The patient is not nervous/anxious.        Objective:   Physical Exam  Vitals reviewed. Constitutional: She is oriented to person, place, and time. She appears well-developed and well-nourished. No distress.       Body mass index is 46.40 kg/(m^2).   HENT:  Head: Normocephalic and atraumatic.  Right Ear: External ear normal.  Left  Ear: External ear normal.  Mouth/Throat: Oropharynx is clear and moist. No oropharyngeal exudate.       Some post nasal drip ?  Eyes: Conjunctivae and EOM are normal. Pupils are equal, round, and reactive to light. Right eye exhibits no discharge. Left eye exhibits no discharge. No scleral icterus.  Neck: Normal range of motion. Neck supple. No JVD present. No tracheal deviation present. No thyromegaly present.  Cardiovascular: Normal rate, regular rhythm, normal heart sounds and intact distal pulses.  Exam reveals no gallop and no friction rub.   No murmur heard. Pulmonary/Chest: Effort normal and breath sounds normal. No respiratory distress. She has no wheezes. She has no rales. She exhibits no tenderness.  Abdominal: Soft. Bowel sounds are normal. She exhibits no distension and no mass. There is no tenderness. There is no rebound and no guarding.  Musculoskeletal: Normal range of motion. She exhibits no edema and no tenderness.  Lymphadenopathy:    She has no cervical adenopathy.  Neurological: She is alert and oriented to person, place, and time. She has normal reflexes. No cranial nerve deficit. She exhibits normal muscle tone. Coordination normal.  Skin: Skin is warm and dry. No rash noted. She is not diaphoretic. No erythema. No pallor.  Psychiatric: She has a normal mood and affect. Her behavior is normal. Judgment and thought content normal.          Assessment & Plan:

## 2012-05-22 NOTE — Patient Instructions (Addendum)
Cough is from sinus drainage, acid reflux, possible  asthma,  All of this is working together to cause cyclical cough  Otherwise called LPR cough Follow all instructions 100% of the time  #Sinus drainage  - start/continue netti pot daily - continue allegra as you have always done - start nasal steroid generic fluticasone inhaler 2 squirts each nostril daily as advised (nurse will do script)  #Possible Acid Reflux  - continue nexium like you have always done   - take diet sheet from [place] - avoid colas, spices, cheeses, spirits, red meats, beer, chocolates, fried foods etc.,   - sleep with head end of bed elevated  - eat small frequent meals  - do not go to bed for 3 hours after last meal  #Possible Asthma   - stop symbicort for 2 weeks and then do methacholine challenge test (you cannot be pregnant or have heart disease for this test)  -  I will call you with results  #Cyclical cough  - please choose 2-3 days and observe complete voice rest - no talking or whispering  - at all times there  there is urge to cough, drink water or swallow or sip on throat lozenge  #Followup - I will see you in 4-6 weeks.  - any problems call or come sooner - Cough score at followup

## 2012-06-06 ENCOUNTER — Ambulatory Visit (HOSPITAL_COMMUNITY)
Admission: RE | Admit: 2012-06-06 | Discharge: 2012-06-06 | Disposition: A | Discharge status: Home / Self Care | Payer: PRIVATE HEALTH INSURANCE | Source: Ambulatory Visit | Attending: Internal Medicine | Admitting: Internal Medicine

## 2012-06-06 ENCOUNTER — Encounter (HOSPITAL_COMMUNITY): Payer: PRIVATE HEALTH INSURANCE

## 2012-06-06 DIAGNOSIS — R059 Cough, unspecified: Secondary | ICD-10-CM | POA: Insufficient documentation

## 2012-06-06 DIAGNOSIS — R05 Cough: Secondary | ICD-10-CM

## 2012-06-06 DIAGNOSIS — R053 Chronic cough: Secondary | ICD-10-CM

## 2012-06-06 LAB — PULMONARY FUNCTION TEST

## 2012-06-06 MED ORDER — METHACHOLINE 0.0625 MG/ML NEB SOLN
2.0000 mL | Freq: Once | RESPIRATORY_TRACT | Status: AC
Start: 2012-06-06 — End: 2012-06-06
  Administered 2012-06-06: 0.125 mg via RESPIRATORY_TRACT

## 2012-06-06 MED ORDER — METHACHOLINE 4 MG/ML NEB SOLN
2.0000 mL | Freq: Once | RESPIRATORY_TRACT | Status: AC
  Administered 2012-06-06: 8 mg via RESPIRATORY_TRACT

## 2012-06-06 MED ORDER — METHACHOLINE 1 MG/ML NEB SOLN
2.0000 mL | Freq: Once | RESPIRATORY_TRACT | Status: AC
Start: 2012-06-06 — End: 2012-06-06
  Administered 2012-06-06: 2 mg via RESPIRATORY_TRACT

## 2012-06-06 MED ORDER — SODIUM CHLORIDE 0.9 % IN NEBU
3.0000 mL | INHALATION_SOLUTION | Freq: Once | RESPIRATORY_TRACT | Status: AC
  Administered 2012-06-06: 3 mL via RESPIRATORY_TRACT

## 2012-06-06 MED ORDER — METHACHOLINE 0.25 MG/ML NEB SOLN
2.0000 mL | Freq: Once | RESPIRATORY_TRACT | Status: AC
Start: 2012-06-06 — End: 2012-06-06
  Administered 2012-06-06: 0.5 mg via RESPIRATORY_TRACT

## 2012-06-06 MED ORDER — METHACHOLINE 16 MG/ML NEB SOLN
2.0000 mL | Freq: Once | RESPIRATORY_TRACT | Status: AC
  Administered 2012-06-06: 32 mg via RESPIRATORY_TRACT

## 2012-06-06 MED ORDER — ALBUTEROL SULFATE (5 MG/ML) 0.5% IN NEBU
2.5000 mg | INHALATION_SOLUTION | Freq: Once | RESPIRATORY_TRACT | Status: AC
  Administered 2012-06-06: 2.5 mg via RESPIRATORY_TRACT

## 2012-06-24 ENCOUNTER — Ambulatory Visit (INDEPENDENT_AMBULATORY_CARE_PROVIDER_SITE_OTHER): Payer: PRIVATE HEALTH INSURANCE | Admitting: Internal Medicine

## 2012-06-24 VITALS — BP 110/60 | HR 84 | Temp 98.4°F | Ht <= 58 in | Wt 215.4 lb

## 2012-06-24 DIAGNOSIS — R053 Chronic cough: Secondary | ICD-10-CM

## 2012-06-24 DIAGNOSIS — R05 Cough: Secondary | ICD-10-CM

## 2012-06-24 DIAGNOSIS — R059 Cough, unspecified: Secondary | ICD-10-CM

## 2012-06-24 NOTE — Patient Instructions (Addendum)
Cough is from sinus drainage, acid reflux, and  asthma,  All of this is working together to cause cyclical cough  Otherwise called LPR cough Follow all instructions 100% of the time Unclear if cough unchnaged because you were off symbicort but followed sinus and gerd instructions  #Sinus drainage  - continue netti atleast 3 to 7 times per weeky - continue allegra as you have always done - continue nasal steroid generic fluticasone inhaler 2 squirts each nostril daily as advised - you need to improve your compliance with this  #Possible Acid Reflux  - continue nexium like you have always done   -continue to avoid colas, spices, cheeses, spirits, red meats, beer, chocolates, fried foods etc., \ - you can follow the low glycemic diet plan in the sheet given to you  - sleep with head end of bed elevated  - eat small frequent meals  - do not go to bed for 3 hours after last meal  # Asthma  - confirmed on methacholine challenge test  - restart symbicort for 2 weeks; you need to take this2 puff twice daily scheduled no matter how you feel - use albuterol as needed   #Cyclical cough  - at all times there  there is urge to cough, drink water or swallow or sip on throat lozenge  #Followup - I will see you in 8 weeks.  - any problems call or come sooner - Cough score at followup

## 2012-06-24 NOTE — Progress Notes (Signed)
Subjective:     Patient ID: Shawna Gonzalez, female   DOB: 1951/04/10, 61 y.o.   MRN: [id number]  HPI  IOV 05/22/2012  Reports chronic cough. Says that every nov 2012 that she gets sinusitis and this turns to asthma and cough but always resolve in 6-8 weeks but since Nov 2012 episode cough not resolved.  Cough present 24/7 day and night per report. Constantly cough. Rates as moderate-sever cough. Cough triggered by nothing and can happen any time randomly. Cough improved by hydrocodone cough syrup and provides only partial relief during sleep.. Cough is of BARKING quality in office.   There is associated post nasal sinus drainage that is also new since nov 2012 but there is background hx of intermitent sinus issues. On chronic claritin.  Used to see someone in [facility] and at one point was recommended surgery for DNS.   There is associated hx of Ulcerative colitis (inactive x 11 years) hx of hiatal hernia (Dr Diamond Nickel) and GERD but she says it is a problem only when she eats late at night, or greasy food or spicy food. However, there was chest pains in Jan 2013 and this improved with PPI; recently dose cut down and chest pain is improved but still present a 2 times per week. She is on anti-hypertensive losartan currently but she was on lisinopril that was stopped feb 2013 but cough did not improve.   No definite asthma diagnosis but says clinically suspected x 6-7 years which she takes 50% of the time but does not help cough.   Hx of allergy skin test x 20 years ago and reportedly postiive for dust and mold. Curently denies spring allergy symptoms.   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[employer]  Hx of negative cardiac stress test feb 2013   REC  Cough is from sinus drainage, acid reflux, possible  asthma,  All of this is working together to cause cyclical cough  Otherwise called LPR cough Follow all instructions 100% of the time  #Sinus drainage  - start/continue netti pot daily -  continue allegra as you have always done - start nasal steroid generic fluticasone inhaler 2 squirts each nostril daily as advised (nurse will do script)  #Possible Acid Reflux  - continue nexium like you have always done   - take diet sheet from [place] - avoid colas, spices, cheeses, spirits, red meats, beer, chocolates, fried foods etc.,   - sleep with head end of bed elevated  - eat small frequent meals  - do not go to bed for 3 hours after last meal  #Possible Asthma   - stop symbicort for 2 weeks and then do methacholine challenge test (you cannot be pregnant or have heart disease for this test)  -  I will call you with results  #Cyclical cough  - please choose 2-3 days and observe complete voice rest - no talking or whispering  - at all times there  there is urge to cough, drink water or swallow or sip on throat lozenge  #Followup - I will see you in 4-6 weeks.  - any problems call or come sooner - Cough score at followup  OV 06/24/2012   No better in terms of cough. RSI cough score is 19 and basically unchanged.  Using netti pot 5t per week. Taking allegra daily as before but taking nasal steroid 3-4 times per week. In terms of gerd Rx, taking nexium daily on empty stomach. In terms of  gerd diet, says she is compliant and has avoided soda. Did have methacholine test 06/06/12 off symbicort and result is positive for asthma (resul availabel only today) Currently taking symbicort only prn (3 tims per week as before). In terms of cyclical cough - did 2 days voice rest.   Dr Gretta Cool Reflux Symptom Index (> 13-15 suggestive of LPR cough) 0 -> 5  =  none ->severe problem 05/22/12 06/24/2012   Hoarseness or problem with voice 3 2  Clearing  Of Throat 2 3  Excess throat mucus or feeling of post nasal drip 3 3.5  Difficulty swallowing food, liquid or tablets 1 0  Cough after eating or lying down 1 4  Breathing difficulties or choking episodes 5 1  Troublesome or annoying cough 0 4    Sensation of something sticking in throat or lump in throat 0 0  Heartburn, chest pain, indigestion, or stomach acid coming up  1.5  TOTAL 16 19     Current outpatient prescriptions:aspirin 81 MG chewable tablet, Chew 81 mg by mouth daily., Disp: , Rfl: ;  esomeprazole (NEXIUM) 40 MG capsule, Take 40 mg by mouth daily before breakfast., Disp: , Rfl: ;  fluticasone (FLONASE) 50 MCG/ACT nasal spray, Place 2 sprays into the nose daily., Disp: 16 g, Rfl: 2;  furosemide (LASIX) 20 MG tablet, Take 20 mg by mouth daily as needed., Disp: , Rfl:  HYDROcodone-homatropine (HYCODAN) 5-1.5 MG/5ML syrup, Take by mouth every 6 (six) hours as needed., Disp: , Rfl: ;  losartan (COZAAR) 50 MG tablet, Take 50 mg by mouth daily., Disp: , Rfl: ;  Pediatric Multivit-Minerals-C (MULTIVITAMIN/IRON) 60 MG CHEW, Chew 1 tablet by mouth daily., Disp: , Rfl: ;  potassium chloride SA (K-DUR,KLOR-CON) 20 MEQ tablet, Take 20 mEq by mouth 3 (three) times a week., Disp: , Rfl:  timolol (BETIMOL) 0.5 % ophthalmic solution, Place 1 drop into both eyes daily., Disp: , Rfl: ;  Vitamin D, Ergocalciferol, (DRISDOL) 50000 UNITS CAPS, Take 50,000 Units by mouth 2 (two) times a week. , Disp: , Rfl: ;  zoledronic acid (RECLAST) 5 MG/100ML SOLN, Inject 5 mg into the vein once. Once a year, Disp: , Rfl:    Review of Systems  Constitutional: Negative for fever, chills, diaphoresis, activity change, appetite change, fatigue and unexpected weight change.  HENT: Positive for rhinorrhea and postnasal drip. Negative for hearing loss, ear pain, nosebleeds, congestion, sore throat, facial swelling, sneezing, mouth sores, trouble swallowing, neck pain, neck stiffness, dental problem, voice change, sinus pressure, tinnitus and ear discharge.   Eyes: Negative for photophobia, discharge, itching and visual disturbance.  Respiratory: Positive for cough. Negative for apnea, choking, chest tightness, shortness of breath, wheezing and stridor.    Cardiovascular: Positive for leg swelling. Negative for chest pain and palpitations.  Gastrointestinal: Negative for nausea, vomiting, abdominal pain, constipation, blood in stool and abdominal distention.  Genitourinary: Negative for dysuria, urgency, frequency, hematuria, flank pain, decreased urine volume and difficulty urinating.  Musculoskeletal: Negative for myalgias, back pain, joint swelling, arthralgias and gait problem.  Skin: Negative for color change, pallor and rash.  Neurological: Negative for dizziness, tremors, seizures, syncope, speech difficulty, weakness, light-headedness, numbness and headaches.  Hematological: Negative for adenopathy. Does not bruise/bleed easily.  Psychiatric/Behavioral: Negative for confusion, disturbed wake/sleep cycle and agitation. The patient is not nervous/anxious.        Objective:   Physical Exam  Vitals reviewed. Constitutional: She is oriented to person, place, and time. She appears well-developed and well-nourished. No distress.  Body mass index is 45.02 kg/(m^2).   HENT:  Head: Normocephalic and atraumatic.  Right Ear: External ear normal.  Left Ear: External ear normal.  Mouth/Throat: Oropharynx is clear and moist. No oropharyngeal exudate.  Eyes: Conjunctivae and EOM are normal. Pupils are equal, round, and reactive to light. Right eye exhibits no discharge. Left eye exhibits no discharge. No scleral icterus.  Neck: Normal range of motion. Neck supple. No JVD present. No tracheal deviation present. No thyromegaly present.  Cardiovascular: Normal rate, regular rhythm, normal heart sounds and intact distal pulses.  Exam reveals no gallop and no friction rub.   No murmur heard. Pulmonary/Chest: Effort normal and breath sounds normal. No respiratory distress. She has no wheezes. She has no rales. She exhibits no tenderness.  Abdominal: Soft. Bowel sounds are normal. She exhibits no distension and no mass. There is no tenderness. There  is no rebound and no guarding.  Musculoskeletal: Normal range of motion. She exhibits no edema and no tenderness.  Lymphadenopathy:    She has no cervical adenopathy.  Neurological: She is alert and oriented to person, place, and time. She has normal reflexes. No cranial nerve deficit. She exhibits normal muscle tone. Coordination normal.  Skin: Skin is warm and dry. No rash noted. She is not diaphoretic. No erythema. No pallor.  Psychiatric: She has a normal mood and affect. Her behavior is normal. Judgment and thought content normal.       Assessment:         Plan:

## 2012-06-28 ENCOUNTER — Encounter: Payer: Self-pay | Admitting: Internal Medicine

## 2012-06-28 NOTE — Assessment & Plan Note (Signed)
Cough is from sinus drainage, acid reflux, and  asthma,  All of this is working together to cause cyclical cough  Otherwise called LPR cough Follow all instructions 100% of the time  #Sinus drainage  - continue netti atleast 3 to 7 times per weeky - continue allegra as you have always done - continue nasal steroid generic fluticasone inhaler 2 squirts each nostril daily as advised - you need to improve your compliance with this  #Possible Acid Reflux  - continue nexium like you have always done   -continue to avoid colas, spices, cheeses, spirits, red meats, beer, chocolates, fried foods etc., \ - you can follow the low glycemic diet plan in the sheet given to you  - sleep with head end of bed elevated  - eat small frequent meals  - do not go to bed for 3 hours after last meal  # Asthma  - confirmed on methacholine challenge test  - restart symbicort for 2 weeks; you need to take this2 puff twice daily scheduled no matter how you feel - use albuterol as needed   #Cyclical cough  - at all times there  there is urge to cough, drink water or swallow or sip on throat lozenge  #Followup - I will see you in 8 weeks.  - any problems call or come sooner - Cough score at followup

## 2012-07-04 ENCOUNTER — Encounter (HOSPITAL_COMMUNITY)
Admission: RE | Admit: 2012-07-04 | Discharge: 2012-07-04 | Disposition: A | Discharge status: Home / Self Care | Payer: PRIVATE HEALTH INSURANCE | Source: Ambulatory Visit | Attending: Internal Medicine | Admitting: Internal Medicine

## 2012-07-04 ENCOUNTER — Encounter (HOSPITAL_COMMUNITY): Payer: Self-pay

## 2012-07-04 DIAGNOSIS — M810 Age-related osteoporosis without current pathological fracture: Secondary | ICD-10-CM | POA: Insufficient documentation

## 2012-07-04 MED ORDER — SODIUM CHLORIDE 0.9 % IV SOLN
INTRAVENOUS | Status: AC
  Administered 2012-07-04: 16:00:00 via INTRAVENOUS

## 2012-07-04 MED ORDER — ZOLEDRONIC ACID 5 MG/100ML IV SOLN
5.0000 mg | INTRAVENOUS | Status: AC
  Administered 2012-07-04: 5 mg via INTRAVENOUS
  Filled 2012-07-04: qty 100

## 2012-09-16 ENCOUNTER — Encounter: Payer: Self-pay | Admitting: Internal Medicine

## 2012-09-16 ENCOUNTER — Ambulatory Visit (INDEPENDENT_AMBULATORY_CARE_PROVIDER_SITE_OTHER): Payer: PRIVATE HEALTH INSURANCE | Admitting: Internal Medicine

## 2012-09-16 VITALS — BP 130/80 | HR 81 | Temp 98.5°F | Ht <= 58 in | Wt 199.6 lb

## 2012-09-16 DIAGNOSIS — R059 Cough, unspecified: Secondary | ICD-10-CM

## 2012-09-16 DIAGNOSIS — R05 Cough: Secondary | ICD-10-CM

## 2012-09-16 DIAGNOSIS — R053 Chronic cough: Secondary | ICD-10-CM

## 2012-09-16 DIAGNOSIS — K5190 Ulcerative colitis, unspecified, without complications: Secondary | ICD-10-CM

## 2012-09-16 DIAGNOSIS — J8410 Pulmonary fibrosis, unspecified: Secondary | ICD-10-CM

## 2012-09-16 DIAGNOSIS — J849 Interstitial pulmonary disease, unspecified: Secondary | ICD-10-CM

## 2012-09-16 NOTE — Patient Instructions (Addendum)
Glad you are better in terms of cough and weight Continue efforts at weight loss Have CT chest without contrast to look for ILD causes of cough I will call you with results to plan next step which might involve  Gabapentin and/or neuro-rehab speech therapy for irritable larynx syndrome

## 2012-09-16 NOTE — Progress Notes (Signed)
Subjective:    Patient ID: Shawna Gonzalez, female    DOB: 06-06-1951, 61 y.o.   MRN: [id number]  HPI IOV 05/22/2012  Reports chronic cough. Says that every nov 2012 that she gets sinusitis and this turns to asthma and cough but always resolve in 6-8 weeks but since Nov 2012 episode cough not resolved.  Cough present 24/7 day and night per report. Constantly cough. Rates as moderate-sever cough. Cough triggered by nothing and can happen any time randomly. Cough improved by hydrocodone cough syrup and provides only partial relief during sleep.. Cough is of BARKING quality in office.   There is associated post nasal sinus drainage that is also new since nov 2012 but there is background hx of intermitent sinus issues. On chronic claritin.  Used to see someone in [facility] and at one point was recommended surgery for DNS.   There is associated hx of Ulcerative colitis (inactive x 11 years) hx of hiatal hernia (Dr Diamond Nickel) and GERD but she says it is a problem only when she eats late at night, or greasy food or spicy food. However, there was chest pains in Jan 2013 and this improved with PPI; recently dose cut down and chest pain is improved but still present a 2 times per week. She is on anti-hypertensive losartan currently but she was on lisinopril that was stopped feb 2013 but cough did not improve.   No definite asthma diagnosis but says clinically suspected x 6-7 years which she takes 50% of the time but does not help cough.   Hx of allergy skin test x 20 years ago and reportedly postiive for dust and mold. Curently denies spring allergy symptoms.   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[employer]  Hx of negative cardiac stress test feb 2013   REC  Cough is from sinus drainage, acid reflux, possible  asthma,  All of this is working together to cause cyclical cough  Otherwise called LPR cough Follow all instructions 100% of the time  #Sinus drainage  - start/continue netti pot daily -  continue allegra as you have always done - start nasal steroid generic fluticasone inhaler 2 squirts each nostril daily as advised (nurse will do script)  #Possible Acid Reflux  - continue nexium like you have always done   - take diet sheet from [place] - avoid colas, spices, cheeses, spirits, red meats, beer, chocolates, fried foods etc.,   - sleep with head end of bed elevated  - eat small frequent meals  - do not go to bed for 3 hours after last meal  #Possible Asthma   - stop symbicort for 2 weeks and then do methacholine challenge test (you cannot be pregnant or have heart disease for this test)  -  I will call you with results  #Cyclical cough  - please choose 2-3 days and observe complete voice rest - no talking or whispering  - at all times there  there is urge to cough, drink water or swallow or sip on throat lozenge  #Followup - I will see you in 4-6 weeks.  - any problems call or come sooner - Cough score at followup  OV 06/24/2012 Followup cough  Using netti pot 5t per week. Taking allegra daily as before but taking nasal steroid 3-4 times per week. In terms of gerd Rx, taking nexium daily on empty stomach. In terms of gerd diet, says she is compliant and has avoided soda. Did have methacholine test 06/06/12  off symbicort and result is positive for asthma (resul availabel only today) Currently taking symbicort only prn (3 tims per week as before). In terms of cyclical cough - did 2 days voice rest.   With above: No better in terms of cough. RSI cough score is 19 and basically unchanged to mildly worse  REC Cough is from sinus drainage, acid reflux, and asthma,  All of this is working together to cause cyclical cough Otherwise called LPR cough  Follow all instructions 100% of the time  Unclear if cough unchnaged because you were off symbicort but followed sinus and gerd instructions  #Sinus drainage  - continue netti atleast 3 to 7 times per weeky  - continue allegra as you  have always done  - continue nasal steroid generic fluticasone inhaler 2 squirts each nostril daily as advised - you need to improve your compliance with this  #Possible Acid Reflux  - continue nexium like you have always done  -continue to avoid colas, spices, cheeses, spirits, red meats, beer, chocolates, fried foods etc., \  - you can follow the low glycemic diet plan in the sheet given to you  - sleep with head end of bed elevated  - eat small frequent meals  - do not go to bed for 3 hours after last meal  # Asthma  - confirmed on methacholine challenge test  - restart symbicort for 2 weeks; you need to take this2 puff twice daily scheduled no matter how you feel  - use albuterol as needed  #Cyclical cough  - at all times there there is urge to cough, drink water or swallow or sip on throat lozenge  #Followup  - I will see you in 8 weeks.  - any problems call or come sooner  - Cough score at followup   OV 09/16/2012  Followup cough. Cough is significantly better. RSI cough is down to 13 (from 19). In terms of sinus: doing netti pot 5 times per week, daily allegra, and daily nasal steroid. Despite this mucus drainage is around the same.Thre is no associatd anosmia. Denies blocked nose. In terms of GERD: doing low glycemic diet, weight watchers and avioding triggers. She has lost 20# since July 2013. Continues with nexium. Overall GERD better but still with occasional heart burn (cardiac stress test negative per hx feb 2013, has had GI eval DR Juanda Chance). In terms of asthma: not using symbicort regularly as advised. Just using it 3-4 times per week as needed becaues of feeling better (this is how she normally uses symbicort).  Exhaled FeNO today 09/16/2012: is 1 and LOW PROB for asthma. In terms of Cyclical cough: ocassionally tries to break cycle only. Never done speech therapy or neurontin.    Overall feels satisfied 100% better but feels there is some room to improve with residual  cough  Dr Gretta Cool Reflux Symptom Index (> 13-15 suggestive of LPR cough)  05/22/12 06/24/2012  09/16/2012   Hoarseness or problem with voice 3 2 2   Clearing  Of Throat 2 3 2   Excess throat mucus or feeling of post nasal drip 3 3.5 3  Difficulty swallowing food, liquid or tablets 1 0 0  Cough after eating or lying down 1 4 1   Breathing difficulties or choking episodes 5 1 1   Troublesome or annoying cough 0 4 1  Sensation of something sticking in throat or lump in throat 0 0 0  Heartburn, chest pain, indigestion, or stomach acid coming up  1.5 3  TOTAL 16 19 13       Kouffman Reflux v Neurogenic Cough Differentiator Reflux 09/16/2012   Do you awaken from a sound sleep coughing violently?                            With trouble breathing? Yes - not often  Do you have choking episodes when you cannot  Get enough air, gasping for air ?              n  Do you usually cough when you lie down into  The bed, or when you just lie down to rest ?                          n  Do you usually cough after meals or eating?         n  Do you cough when (or after) you bend over?    n  GERD SCORE  1  Kouffman Reflux v Neurogenic Cough Differentiator Neurogenic  Do you more-or-less cough all day long?   Does change of temperature make you cough? y  Does laughing or chuckling cause you to cough?   Do fumes (perfume, automobile fumes, burned  Toast, etc.,) cause you to cough ?      y  Does speaking, singing, or talking on the phone cause you to cough   ?               No  - not often  Neurogenic/Airway score 2.5      Review of Systems  Constitutional: Negative for fever and unexpected weight change.  HENT: Negative for ear pain, nosebleeds, congestion, sore throat, rhinorrhea, sneezing, trouble swallowing, dental problem, postnasal drip and sinus pressure.   Eyes: Negative for redness and itching.  Respiratory: Negative for cough, chest tightness, shortness of breath and wheezing.    Cardiovascular: Negative for palpitations and leg swelling.  Gastrointestinal: Negative for nausea and vomiting.  Genitourinary: Negative for dysuria.  Musculoskeletal: Negative for joint swelling.  Skin: Negative for rash.  Neurological: Negative for headaches.  Hematological: Does not bruise/bleed easily.  Psychiatric/Behavioral: Negative for dysphoric mood. The patient is not nervous/anxious.        Objective:   Physical Exam Vitals reviewed. Constitutional: She is oriented to person, place, and time. She appears well-developed and well-nourished. No distress.       Body mass index is 45.02 kg/(m^2).   HENT:  Head: Normocephalic and atraumatic.  Right Ear: External ear normal.  Left Ear: External ear normal.  Mouth/Throat: Oropharynx is clear and moist. No oropharyngeal exudate.  Eyes: Conjunctivae and EOM are normal. Pupils are equal, round, and reactive to light. Right eye exhibits no discharge. Left eye exhibits no discharge. No scleral icterus.  Neck: Normal range of motion. Neck supple. No JVD present. No tracheal deviation present. No thyromegaly present.  Cardiovascular: Normal rate, regular rhythm, normal heart sounds and intact distal pulses.  Exam reveals no gallop and no friction rub.   No murmur heard. Pulmonary/Chest: Effort normal and breath sounds normal. No respiratory distress. She has no wheezes. She has no rales. She exhibits no tenderness.  Abdominal: Soft. Bowel sounds are normal. She exhibits no distension and no mass. There is no tenderness. There is no rebound and no guarding.  Musculoskeletal: Normal range of motion. She exhibits no edema and no tenderness.  Lymphadenopathy:    She has no  cervical adenopathy.  Neurological: She is alert and oriented to person, place, and time. She has normal reflexes. No cranial nerve deficit. She exhibits normal muscle tone. Coordination normal.  Skin: Skin is warm and dry. No rash noted. She is not diaphoretic. No  erythema. No pallor.  Psychiatric: She has a normal mood and affect. Her behavior is normal. Judgment and thought content normal.          Assessment & Plan:

## 2012-09-17 NOTE — Assessment & Plan Note (Signed)
Cough significantly better but there is still residual cough Will get CT chest to ensure no fibrosis or bronchiectasis esp in setting of ulcerative colitis If CT isnegative, will refer to seepch +/- Rx gabapentin Encouraged to continue weight loss which should ultimately help GERD mechanim for cough  Of note, FeNO test < 19 and is VEry LOW PROB for asthma. This test has been done in setting of taking sporadic symbicort. Depending on CT results, I might have her dc symbicort

## 2012-09-18 ENCOUNTER — Ambulatory Visit (INDEPENDENT_AMBULATORY_CARE_PROVIDER_SITE_OTHER)
Admission: RE | Admit: 2012-09-18 | Discharge: 2012-09-18 | Disposition: A | Discharge status: Home / Self Care | Payer: PRIVATE HEALTH INSURANCE | Source: Ambulatory Visit | Attending: Internal Medicine | Admitting: Internal Medicine

## 2012-09-18 DIAGNOSIS — R059 Cough, unspecified: Secondary | ICD-10-CM

## 2012-09-18 DIAGNOSIS — J849 Interstitial pulmonary disease, unspecified: Secondary | ICD-10-CM

## 2012-09-18 DIAGNOSIS — J8410 Pulmonary fibrosis, unspecified: Secondary | ICD-10-CM

## 2012-09-18 DIAGNOSIS — R053 Chronic cough: Secondary | ICD-10-CM

## 2012-09-18 DIAGNOSIS — K5190 Ulcerative colitis, unspecified, without complications: Secondary | ICD-10-CM

## 2012-09-18 DIAGNOSIS — R05 Cough: Secondary | ICD-10-CM

## 2012-10-01 ENCOUNTER — Encounter: Payer: Self-pay | Admitting: Internal Medicine

## 2012-10-02 ENCOUNTER — Telehealth: Payer: Self-pay | Admitting: Internal Medicine

## 2012-10-02 DIAGNOSIS — R05 Cough: Secondary | ICD-10-CM

## 2012-10-02 DIAGNOSIS — J387 Other diseases of larynx: Secondary | ICD-10-CM

## 2012-10-02 DIAGNOSIS — R053 Chronic cough: Secondary | ICD-10-CM

## 2012-10-02 NOTE — Telephone Encounter (Signed)
CT chest 09/18/12 is normal. No findings to explain chronic cough  PLAN  - a) dc symbicort B) Take gabapentin 300mg  once daily x 3 days, then 300mg  twice daily x 3 days, then 300mg  three times daily to continue. If this makes her too sleepy or drowsy call us and we will cut her medication dosing down. Give 30 day with 1 refill C) refer speech therapy for Irritable larynx (done) D) ROV 4-6 weeks, Exhaled NO at fu

## 2012-10-04 MED ORDER — GABAPENTIN 300 MG PO CAPS: ORAL_CAPSULE | ORAL | Status: DC

## 2012-10-04 NOTE — Telephone Encounter (Signed)
Pt returned call. Please call pt back at [phone]. Shawna Gonzalez

## 2012-10-04 NOTE — Telephone Encounter (Signed)
[id number]. Med sent to pharmacy.

## 2012-10-04 NOTE — Telephone Encounter (Signed)
I spoke with pt and is aware of results. Pt also aware of plan. Referral was also placed. Appt scheduled for 11/01/12 at 4:30.  Nothing further was needed.

## 2012-10-04 NOTE — Telephone Encounter (Signed)
rx was printed, will send electronically.

## 2012-10-07 ENCOUNTER — Telehealth: Payer: Self-pay | Admitting: Internal Medicine

## 2012-10-07 NOTE — Telephone Encounter (Signed)
Pt wanted to double check that gabapentin was what she was supposed to get because her pharmacists told her they had never heard of it for cough. I advised it is for her cough. Pt states understanding.Carron Curie, CMA

## 2012-10-16 ENCOUNTER — Telehealth: Payer: Self-pay | Admitting: Internal Medicine

## 2012-10-16 ENCOUNTER — Ambulatory Visit: Payer: PRIVATE HEALTH INSURANCE | Attending: Internal Medicine

## 2012-10-16 DIAGNOSIS — R498 Other voice and resonance disorders: Secondary | ICD-10-CM | POA: Insufficient documentation

## 2012-10-16 DIAGNOSIS — IMO0001 Reserved for inherently not codable concepts without codable children: Secondary | ICD-10-CM | POA: Insufficient documentation

## 2012-10-16 NOTE — Telephone Encounter (Signed)
No need for message. °

## 2012-10-25 ENCOUNTER — Ambulatory Visit: Payer: PRIVATE HEALTH INSURANCE | Attending: Internal Medicine | Admitting: Speech Pathology

## 2012-10-25 DIAGNOSIS — R498 Other voice and resonance disorders: Secondary | ICD-10-CM | POA: Insufficient documentation

## 2012-10-25 DIAGNOSIS — IMO0001 Reserved for inherently not codable concepts without codable children: Secondary | ICD-10-CM | POA: Insufficient documentation

## 2012-10-30 ENCOUNTER — Ambulatory Visit: Payer: PRIVATE HEALTH INSURANCE | Admitting: Speech Pathology

## 2012-11-01 ENCOUNTER — Encounter: Payer: Self-pay | Admitting: Internal Medicine

## 2012-11-01 ENCOUNTER — Ambulatory Visit (INDEPENDENT_AMBULATORY_CARE_PROVIDER_SITE_OTHER): Payer: PRIVATE HEALTH INSURANCE | Admitting: Internal Medicine

## 2012-11-01 VITALS — BP 110/74 | HR 65 | Temp 97.6°F | Ht <= 58 in | Wt 192.0 lb

## 2012-11-01 DIAGNOSIS — R059 Cough, unspecified: Secondary | ICD-10-CM

## 2012-11-01 DIAGNOSIS — R05 Cough: Secondary | ICD-10-CM

## 2012-11-01 DIAGNOSIS — R053 Chronic cough: Secondary | ICD-10-CM

## 2012-11-01 NOTE — Patient Instructions (Addendum)
Glad cough is almost gone Continue sinus and acid reflux measures Continue neurontin through mid-December 2013 at current dose and then from dec 15th to dec 31st gradually taper off as we discussed Finish up speech therapy Return in 3 monthis with cough score at followup

## 2012-11-01 NOTE — Progress Notes (Signed)
Subjective:    Patient ID: Shawna Gonzalez, female    DOB: 06-28-1951, 61 y.o.   MRN: [id number]  HPI  OV 05/22/2012  Reports chronic cough. Says that every nov 2012 that she gets sinusitis and this turns to asthma and cough but always resolve in 6-8 weeks but since Nov 2012 episode cough not resolved.  Cough present 24/7 day and night per report. Constantly cough. Rates as moderate-sever cough. Cough triggered by nothing and can happen any time randomly. Cough improved by hydrocodone cough syrup and provides only partial relief during sleep.. Cough is of BARKING quality in office.   There is associated post nasal sinus drainage that is also new since nov 2012 but there is background hx of intermitent sinus issues. On chronic claritin.  Used to see someone in [place] [facility] and at one point was recommended surgery for DNS.   There is associated hx of Ulcerative colitis (inactive x 11 years) hx of hiatal hernia (Dr Diamond Nickel) and GERD but she says it is a problem only when she eats late at night, or greasy food or spicy food. However, there was chest pains in Jan 2013 and this improved with PPI; recently dose cut down and chest pain is improved but still present a 2 times per week. She is on anti-hypertensive losartan currently but she was on lisinopril that was stopped feb 2013 but cough did not improve.   No definite asthma diagnosis but says clinically suspected x 6-7 years. On symbicort which she takes 50% of the time but does not help cough.   Hx of allergy skin test x 20 years ago and reportedly postiive for dust and mold. Curently denies spring allergy symptoms.   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[employer]  Hx of negative cardiac stress test feb 2013   REC  Cough is from sinus drainage, acid reflux, possible  asthma,  All of this is working together to cause cyclical cough  Otherwise called LPR cough Follow all instructions 100% of the time  #Sinus drainage  - start/continue netti pot  daily - continue allegra as you have always done - start nasal steroid generic fluticasone inhaler 2 squirts each nostril daily as advised (nurse will do script)  #Possible Acid Reflux  - continue nexium like you have always done   - take diet sheet from [place] - avoid colas, spices, cheeses, spirits, red meats, beer, chocolates, fried foods etc.,   - sleep with head end of bed elevated  - eat small frequent meals  - do not go to bed for 3 hours after last meal  #Possible Asthma   - stop symbicort for 2 weeks and then do methacholine challenge test (you cannot be pregnant or have heart disease for this test)  -  I will call you with results  #Cyclical cough  - please choose 2-3 days and observe complete voice rest - no talking or whispering  - at all times there  there is urge to cough, drink water or swallow or sip on throat lozenge  #Followup - I will see you in 4-6 weeks.  - any problems call or come sooner - Cough score at followup  OV 06/24/2012 Followup cough  Using netti pot 5t per week. Taking allegra daily as before but taking nasal steroid 3-4 times per week. In terms of gerd Rx, taking nexium daily on empty stomach. In terms of gerd diet, says she is compliant and has avoided soda. Did have  methacholine test 06/06/12 off symbicort and result is positive for asthma (resul availabel only today) Currently taking symbicort only prn (3 tims per week as before). In terms of cyclical cough - did 2 days voice rest.   With above: No better in terms of cough. RSI cough score is 19 and basically unchanged to mildly worse  REC Cough is from sinus drainage, acid reflux, and asthma,  All of this is working together to cause cyclical cough Otherwise called LPR cough  Follow all instructions 100% of the time  Unclear if cough unchnaged because you were off symbicort but followed sinus and gerd instructions  #Sinus drainage  - continue netti atleast 3 to 7 times per weeky  - continue allegra  as you have always done  - continue nasal steroid generic fluticasone inhaler 2 squirts each nostril daily as advised - you need to improve your compliance with this  #Possible Acid Reflux  - continue nexium like you have always done  -continue to avoid colas, spices, cheeses, spirits, red meats, beer, chocolates, fried foods etc., \  - you can follow the low glycemic diet plan in the sheet given to you  - sleep with head end of bed elevated  - eat small frequent meals  - do not go to bed for 3 hours after last meal  # Asthma  - confirmed on methacholine challenge test  - restart symbicort for 2 weeks; you need to take this2 puff twice daily scheduled no matter how you feel  - use albuterol as needed  #Cyclical cough  - at all times there there is urge to cough, drink water or swallow or sip on throat lozenge  #Followup  - I will see you in 8 weeks.  - any problems call or come sooner  - Cough score at followup   OV 09/16/2012  Followup cough. Cough is significantly better. RSI cough is down to 13 (from 19). In terms of sinus: doing netti pot 5 times per week, daily allegra, and daily nasal steroid. Despite this mucus drainage is around the same.Thre is no associatd anosmia. Denies blocked nose. In terms of GERD: doing low glycemic diet, weight watchers and avioding triggers. She has lost 20# since July 2013. Continues with nexium. Overall GERD better but still with occasional heart burn (cardiac stress test negative per hx feb 2013, has had GI eval DR Juanda Chance). In terms of asthma: not using symbicort regularly as advised. Just using it 3-4 times per week as needed becaues of feeling better (this is how she normally uses symbicort).  Exhaled FeNO today 09/16/2012: is 1 and LOW PROB for asthma. In terms of Cyclical cough: ocassionally tries to break cycle only. Never done speech therapy or neurontin.    Overall feels satisfied 100% better but feels there is some room to improve with residual  cough    REC Glad you are better in terms of cough and weight Continue efforts at weight loss Have CT chest without contrast to look for ILD causes of cough I will call you with results to plan next step which might involve  Gabapentin and/or neuro-rehab speech therapy for irritable larynx syndrome  TELEPHONHE CALL 10/02/12  CT chest 09/18/12 is normal. No findings to explain chronic cough  PLAN  - a) dc symbicort B) Take gabapentin 300mg  once daily x 3 days, then 300mg  twice daily x 3 days, then 300mg  three times daily to continue. If this makes her too sleepy or drowsy call us  and we will cut her medication dosing down. Give 30 day with 1 refill C) refer speech therapy for Irritable larynx (done) D) ROV 4-6 weeks, Exhaled NO at fu   OV 11/01/2012  Cough now resolved. RSI score is a 3 and shows marked improvement (See below)  For irritable layrynx: he is taking neurontin since mid oct 2013 but 2- 3times a day and attending speech therapy - these have helped a lot. Not making her drowsy. For sinuses - not doing netti pot but taking allegra and nasal steroid only occ. For gerd: taking ppi regularaly. Efforts at weight loss  - plateau now due to vacation but now back on track. STill taking some foods that precipitate gerd. In terms of asthma: off symbicort. I was going to check Exhaled NO (off symbicort) in office but no kit today.    Dr Gretta Cool Reflux Symptom Index (> 13-15 suggestive of LPR cough)  05/22/12 06/24/2012  09/16/2012  11/01/2012   Hoarseness or problem with voice 3 2 2 1   Clearing  Of Throat 2 3 2 1   Excess throat mucus or feeling of post nasal drip 3 3.5 3 1   Difficulty swallowing food, liquid or tablets 1 0 0 0  Cough after eating or lying down 1 4 1  0  Breathing difficulties or choking episodes 5 1 1  0  Troublesome or annoying cough 0 4 1 0  Sensation of something sticking in throat or lump in throat 0 0 0 0  Heartburn, chest pain, indigestion, or stomach acid  coming up  1.5 3 0  TOTAL 16 19 13 3       Kouffman Reflux v Neurogenic Cough Differentiator Reflux 09/16/2012  11/01/2012   Do you awaken from a sound sleep coughing violently?                            With trouble breathing? Yes - not often n  Do you have choking episodes when you cannot  Get enough air, gasping for air ?              n n  Do you usually cough when you lie down into  The bed, or when you just lie down to rest ?                          n n  Do you usually cough after meals or eating?         n n  Do you cough when (or after) you bend over?    n n  GERD SCORE  1 0  Kouffman Reflux v Neurogenic Cough Differentiator Neurogenic   Do you more-or-less cough all day long?  n  Does change of temperature make you cough? y y  Does laughing or chuckling cause you to cough?  n  Do fumes (perfume, automobile fumes, burned  Toast, etc.,) cause you to cough ?      y y  Does speaking, singing, or talking on the phone cause you to cough   ?               No  - not often n  Neurogenic/Airway score 2.5 2     Review of Systems  Constitutional: Negative for fever and unexpected weight change.  HENT: Positive for postnasal drip and sinus pressure. Negative for ear pain, nosebleeds, congestion, sore throat, rhinorrhea, sneezing, trouble swallowing and dental  problem.   Eyes: Negative for redness and itching.  Respiratory: Negative for cough, chest tightness, shortness of breath and wheezing.   Cardiovascular: Negative for palpitations and leg swelling.  Gastrointestinal: Negative for nausea and vomiting.  Genitourinary: Negative for dysuria.  Musculoskeletal: Negative for joint swelling.  Skin: Negative for rash.  Neurological: Negative for headaches.  Hematological: Does not bruise/bleed easily.  Psychiatric/Behavioral: Negative for dysphoric mood. The patient is not nervous/anxious.    Past, Family, Social reviewed: no change since last visit     Objective:   Physical  Exam   Vitals reviewed. Constitutional: She is oriented to person, place, and time. She appears well-developed and well-nourished. No distress.       Body mass index is 45.02 kg/(m^2).   HENT:  Head: Normocephalic and atraumatic.  Right Ear: External ear normal.  Left Ear: External ear normal.  Mouth/Throat: Oropharynx is clear and moist. No oropharyngeal exudate.  Eyes: Conjunctivae and EOM are normal. Pupils are equal, round, and reactive to light. Right eye exhibits no discharge. Left eye exhibits no discharge. No scleral icterus.  Neck: Normal range of motion. Neck supple. No JVD present. No tracheal deviation present. No thyromegaly present.  Cardiovascular: Normal rate, regular rhythm, normal heart sounds and intact distal pulses.  Exam reveals no gallop and no friction rub.   No murmur heard. Pulmonary/Chest: Effort normal and breath sounds normal. No respiratory distress. She has no wheezes. She has no rales. She exhibits no tenderness.  Abdominal: Soft. Bowel sounds are normal. She exhibits no distension and no mass. There is no tenderness. There is no rebound and no guarding.  Musculoskeletal: Normal range of motion. She exhibits no edema and no tenderness.  Lymphadenopathy:    She has no cervical adenopathy.  Neurological: She is alert and oriented to person, place, and time. She has normal reflexes. No cranial nerve deficit. She exhibits normal muscle tone. Coordination normal.  Skin: Skin is warm and dry. No rash noted. She is not diaphoretic. No erythema. No pallor.  Psychiatric: She has a normal mood and affect. Her behavior is normal. Judgment and thought content normal.          Assessment & Plan:  I

## 2012-11-03 NOTE — Assessment & Plan Note (Signed)
Glad cough is almost gone Continue sinus and acid reflux measures Continue neurontin through mid-December 2013 at current dose and then from dec 15th to dec 31st gradually taper off as we discussed Finish up speech therapy Return in 3 monthis with cough score at followup 

## 2012-11-08 ENCOUNTER — Ambulatory Visit: Payer: PRIVATE HEALTH INSURANCE

## 2013-10-23 ENCOUNTER — Other Ambulatory Visit: Payer: Self-pay

## 2013-10-31 ENCOUNTER — Other Ambulatory Visit (HOSPITAL_COMMUNITY): Payer: Self-pay | Admitting: Internal Medicine

## 2013-11-07 ENCOUNTER — Encounter (HOSPITAL_COMMUNITY): Payer: Self-pay

## 2013-11-07 ENCOUNTER — Ambulatory Visit (HOSPITAL_COMMUNITY)
Admission: RE | Admit: 2013-11-07 | Discharge: 2013-11-07 | Disposition: A | Discharge status: Home / Self Care | Payer: PRIVATE HEALTH INSURANCE | Source: Ambulatory Visit | Attending: Internal Medicine | Admitting: Internal Medicine

## 2013-11-07 ENCOUNTER — Emergency Department (HOSPITAL_COMMUNITY)
Admission: EM | Admit: 2013-11-07 | Discharge: 2013-11-07 | Discharge status: Against Medical Advice | Payer: PRIVATE HEALTH INSURANCE

## 2013-11-07 DIAGNOSIS — M810 Age-related osteoporosis without current pathological fracture: Secondary | ICD-10-CM | POA: Insufficient documentation

## 2013-11-07 MED ORDER — ZOLEDRONIC ACID 5 MG/100ML IV SOLN
5.0000 mg | Freq: Once | INTRAVENOUS | Status: AC
  Administered 2013-11-07: 5 mg via INTRAVENOUS
  Filled 2013-11-07: qty 100

## 2013-11-07 MED ORDER — SODIUM CHLORIDE 0.9 % IV SOLN
INTRAVENOUS | Status: DC
  Administered 2013-11-07: 15:00:00 via INTRAVENOUS

## 2014-09-22 ENCOUNTER — Encounter: Payer: Self-pay | Admitting: Internal Medicine

## 2015-08-04 ENCOUNTER — Ambulatory Visit (INDEPENDENT_AMBULATORY_CARE_PROVIDER_SITE_OTHER): Payer: 59 | Admitting: Neurology

## 2015-08-04 ENCOUNTER — Encounter: Payer: Self-pay | Admitting: Neurology

## 2015-08-04 VITALS — BP 142/70 | HR 94 | Resp 16 | Ht <= 58 in | Wt 237.5 lb

## 2015-08-04 DIAGNOSIS — J4541 Moderate persistent asthma with (acute) exacerbation: Secondary | ICD-10-CM | POA: Diagnosis not present

## 2015-08-04 DIAGNOSIS — J45909 Unspecified asthma, uncomplicated: Secondary | ICD-10-CM | POA: Insufficient documentation

## 2015-08-04 DIAGNOSIS — G4733 Obstructive sleep apnea (adult) (pediatric): Secondary | ICD-10-CM | POA: Diagnosis not present

## 2015-08-04 DIAGNOSIS — J9600 Acute respiratory failure, unspecified whether with hypoxia or hypercapnia: Secondary | ICD-10-CM

## 2015-08-04 DIAGNOSIS — J9691 Respiratory failure, unspecified with hypoxia: Secondary | ICD-10-CM

## 2015-08-04 DIAGNOSIS — J9692 Respiratory failure, unspecified with hypercapnia: Secondary | ICD-10-CM

## 2015-08-04 NOTE — Patient Instructions (Signed)
I have scheduled a split-night polysomnography that is supposed to help [place] differentiate between obstructive sleep apnea, central sleep apnea or a mix of both. In addition this allows [place] to measure you CO2 levels and oxygen. Following the sleep study we will meet to see which one is the best therapy you have currently a muted response to oxygen supplementation at 2 L per nasal cannula it is possible that the oxygen does not gain sufficient access because you may be a shallow breather. I doubt that you were reluctant to like CPAP at all or consider it but there are options to desensitize and help to tolerate a smaller interface. A lot of things on CPAP therapy have changed since 7 years ago when you were last exposed .

## 2015-08-04 NOTE — Progress Notes (Signed)
[facility]   Provider:  Larey Seat, M D  Referring Provider: Crist Infante, MD Primary Care Physician:  Shawna Ly, MD  Chief Complaint  Patient presents with  . New Evaluation    HPI:  Shawna Gonzalez is a 64 y.o. female , seen here as a referral  from Dr. Joylene Gonzalez for sleep hypoxemia, question if sleep apnea is presented.   Chief complaint according to patient : I had a CPAP in the past but it didn't work well for me "I'm using oxygen at night but I have to wake up 4-5 times to go to the bathroom interrupting my sleep."  Sleep habits are as follows: Shawna Gonzalez reports that she often falls asleep in a chair in the living room rather than in her own bed, and that she feels drowsy or excessively daytime sleepy several times a day. She also reports a very fragmented sleep wake pattern. She often wakes up from sleep because of nocturia and her bathroom breaks continue in daytime. She may go to sleep inadvertently in the recliner at about 8 or 9 PM and then transferred hour later to the bedroom proper. She may there fall asleep for an hour to 2 hours , her sleep is fragmented and these little portions. Sometimes she is too tired to sleepy to transfer from the recliner to the bedroom when she will remain all night in the recliner. In the morning she tries to rise at about 6:30 AM she prepares breakfast for family, and by 7:15 she has to leave for her work. 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 She does not have a shift work history.  Shawna Gonzalez's medical history is as follows. She has some morbid obesity, diabetes mellitus type 2, osteoporosis, sleep related hypoxemia with partially treated obstructive sleep apnea or untreated obstructive sleep apnea. 7 years ago she was diagnosed with a melanoma. She had a vitamin D deficiency, allergic rhinitis, and congenital nystagmus. Her family history is positive for arthritis, diabetes,  hypertension stroke and CVA.   Sleep medical history and family sleep history: she was diagnosed with OSA in 2009, but could not tolerate CPAP.    Social history:  The patient lives with her mother she is single and has no children, she does not drink alcohol she does not smoke she does not use recreational drugs.  Review of Systems: Out of a complete 14 system review, the patient complains of only the following symptoms, and all other reviewed systems are negative. Weight gain, nocturia , urinary frequency on lasix.   Epworth score  13 , Fatigue severity score 45 , geriatric depression score 1 point    Social History   Social History  . Marital Status: Single    Spouse Name: N/A  . Number of Children: 0  . Years of Education: 16   Occupational History  . customer service [employer]   Social History Main Topics  . Smoking status: Never Smoker   . Smokeless tobacco: Never Used  . Alcohol Use: No  . Drug Use: No  . Sexual Activity: Not on file   Other Topics Concern  . Not on file   Social History Narrative   Lives at home with mother.   Caffeine use: Diet and caffeine free soda (8 drinks per day)    Family History  Problem Relation Age of Onset  . Colon polyps Mother   . Colon polyps Father   . Colon  polyps Brother   . Heart disease Mother     Past Medical History  Diagnosis Date  . Osteoporosis   . Glaucoma   . GERD (gastroesophageal reflux disease)   . Ulcerative colitis, chronic   . Adenomatous colon polyp   . Sleep apnea   . Asthma     Past Surgical History  Procedure Laterality Date  . Cataract extraction w/ intraocular lens  implant, bilateral  2008  . De quervain's release  2008    right wrist  . Carpal tunnel release  2010    right  . Cholecystectomy  2000  . Tonsillectomy      Current Outpatient Prescriptions  Medication Sig Dispense Refill  . Albiglutide (TANZEUM) 30 MG PEN Inject 30 mg into the skin once a week.    Marland Kitchen albuterol  (PROVENTIL HFA;VENTOLIN HFA) 108 (90 BASE) MCG/ACT inhaler Inhale 2 puffs into the lungs every 6 (six) hours as needed for wheezing or shortness of breath.    Marland Kitchen aspirin 81 MG chewable tablet Chew 81 mg by mouth daily.    . dorzolamide (TRUSOPT) 2 % ophthalmic solution Apply to eye.    . esomeprazole (NEXIUM) 40 MG capsule Take 40 mg by mouth daily before breakfast.    . fexofenadine (ALLEGRA) 180 MG tablet Take 180 mg by mouth daily.    . fluocinonide cream (LIDEX) 8.46 % Apply 1 application topically as needed.  2  . furosemide (LASIX) 40 MG tablet Take 40 mg by mouth daily.  4  . gabapentin (NEURONTIN) 300 MG capsule Take 1 tab a day x3 days, then increase to 1 tab twice daily x3 days, then increase to 1 tab three times daily and continue. 100 capsule 2  . losartan (COZAAR) 50 MG tablet Take 50 mg by mouth daily.    . NON FORMULARY Apply to eye. Trusopt eye drops    . Pediatric Multivit-Minerals-C (MULTIVITAMIN/IRON) 60 MG CHEW Chew 1 tablet by mouth daily.    . potassium chloride SA (K-DUR,KLOR-CON) 20 MEQ tablet Take 20 mEq by mouth 3 (three) times a week.    . timolol (BETIMOL) 0.5 % ophthalmic solution Place 1 drop into both eyes daily.    . Vitamin D, Ergocalciferol, (DRISDOL) 50000 UNITS CAPS Take 50,000 Units by mouth 2 (two) times a week.     . zoledronic acid (RECLAST) 5 MG/100ML SOLN Inject 5 mg into the vein once. Once a year     No current facility-administered medications for this visit.    Allergies as of 08/04/2015 - Review Complete 08/04/2015  Allergen Reaction Noted  . Amlodipine  08/03/2015  . Bydureon [exenatide]  08/03/2015  . Lisinopril  08/03/2015  . Lunesta [eszopiclone]  08/03/2015  . Sulfa antibiotics Rash 01/09/2012    Vitals: BP 142/70 mmHg  Pulse 94  Resp 16  Ht 4\' 10"  (1.473 m)  Wt 237 lb 8 oz (107.729 kg)  BMI 49.65 kg/m2  SpO2 96% Last Weight:  Wt Readings from Last 1 Encounters:  08/04/15 237 lb 8 oz (107.729 kg)   NGE:XBMW mass index is 49.65  kg/(m^2).     Last Height:   Ht Readings from Last 1 Encounters:  08/04/15 4\' 10"  (1.473 m)    Physical exam:  General: The patient is awake, alert and appears not in acute distress. The patient is well groomed. Head: Normocephalic, atraumatic. Neck is supple. Mallampati 4 ,  neck circumference: 18.5 . Nasal airflow restricted , TMJ is not  evident . Retrognathia is  seen.  Cardiovascular:  Regular rate and rhythm , without  murmurs or carotid bruit, and without distended neck veins. Respiratory: Lungs are clear to auscultation. Skin:  Without evidence of edema, or rash Trunk: BMI is 50 . The patient's abdomen and torso is most affected   Neurologic exam : The patient is awake and alert, oriented to place and time.   Memory subjective described as intact.  Attention span & concentration ability appears normal.  Speech is fluent,  without   dysarthria, dysphonia or aphasia. She is short of breath while speaking.  Mood and affect are appropriate.  Cranial nerves: Pupils are equal and briskly reactive to light. Funduscopic exam without evidence of pallor or edema. Extraocular movements : primary  Nystagmus !   Visual fields by finger perimetry are intact. Hearing to finger rub intact.   Facial sensation intact to fine touch.  Facial motor strength is symmetric and tongue and uvula move midline. Shoulder shrug was symmetrical.   Motor exam:  Normal tone, muscle bulk and symmetric strength in all extremities.  Sensory:  Fine touch, pinprick and vibration were tested in all extremities. Proprioception tested in the upper extremities was normal.  Coordination: Rapid alternating movements in the fingers/hands was normal. Finger-to-nose maneuver  normal without evidence of ataxia, dysmetria or tremor.  Gait and station: Patient walks without assistive device and is able unassisted to climb up to the exam table. Strength within normal limits.  Stance is stable and normal.  Toe and hell stand  were tested . Turns with 4  Steps. Romberg testing is  negative.  Deep tendon reflexes: in the  upper and lower extremities are attenuated ,  symmetric . Babinski maneuver response is  downgoing.  The patient was advised of the nature of the diagnosed sleep disorder , the treatment options and risks for general a health and wellness arising from not treating the condition.  I spent more than 40  minutes of face to face time with the patient. Greater than 50% of time was spent in counseling and coordination of care. We have discussed the diagnosis and differential and I answered the patient's questions.  Mrs. Karnes has undergone several pulse oximetries at home with and without oxygen but I don't have the original records here to review. Per her report the oxygen does make a difference is currently at 2 L nasal cannula. Our goal is today to verify how much hypoxemia is there and is the hypoxemia could be treated with a positive airway pressure or not versus if the current level of oxygen sufficient to treat her.   Assessment:  After physical and neurologic examination, review of laboratory studies,  Personal review of imaging studies, reports of other /same  Imaging studies ,  Results of polysomnography/ neurophysiology testing and pre-existing records as far as provided in visit., my assessment is    1) reported chronic sleep related hypoxemia, and then asthmatic and morbidly obese patient. Her obesity is truncal affecting her diaphragmatic strength and her chest wall movements. 2) It is very likely that the patient still has obstructive sleep apnea but I think she may have a complex form of sleep apnea.  3) her main risk factor is her body mass index, thus far she has not been on a medically supervised diet for weight loss program but she could definitely be a candidate for such. Given her current body mass index and the sacroiliac of the high BMI she could be a candidate for bariatric  surgery.   Plan:  Treatment plan and additional workup :  SPLIT study, AHI 15 and score at 4 %, reflecting  asthma comorbidity, morbid obesity,  Hypoxemia documented at night - the patient will need capnography/  I will follow in 3-5 weeks post sleep study. knowning her claustrophobic tendency, i will need a desensitization through DME for her to be a possible CPAP user.    Asencion Partridge Nowell Sites MD  08/04/2015   CC: Shawna Gonzalez, [place], [place] [id number]

## 2015-08-27 ENCOUNTER — Ambulatory Visit (INDEPENDENT_AMBULATORY_CARE_PROVIDER_SITE_OTHER): Payer: 59 | Admitting: Neurology

## 2015-08-27 DIAGNOSIS — J4541 Moderate persistent asthma with (acute) exacerbation: Secondary | ICD-10-CM | POA: Diagnosis not present

## 2015-08-27 DIAGNOSIS — J9691 Respiratory failure, unspecified with hypoxia: Secondary | ICD-10-CM

## 2015-08-27 DIAGNOSIS — G4733 Obstructive sleep apnea (adult) (pediatric): Secondary | ICD-10-CM

## 2015-08-27 DIAGNOSIS — J9692 Respiratory failure, unspecified with hypercapnia: Secondary | ICD-10-CM

## 2015-08-27 NOTE — Sleep Study (Signed)
Please see the scanned sleep study interpretation located in the procedure tab in the chart view section.  

## 2015-09-06 ENCOUNTER — Telehealth: Payer: Self-pay | Admitting: Neurology

## 2015-09-06 DIAGNOSIS — G4733 Obstructive sleep apnea (adult) (pediatric): Secondary | ICD-10-CM

## 2015-09-06 NOTE — Telephone Encounter (Signed)
Pt called and would like to know her MRI results. Please call and advise [phone]

## 2015-09-07 NOTE — Telephone Encounter (Signed)
Spoke to Shawna Gonzalez and advised her that her sleep study showed severe osa and that cpap is the only advised form of treatment. Dr. Brett Fairy recommended Shawna Gonzalez to proceed urgently with a titration study with an effective cpap desensitization therapy with an ENT evaluation. I advised Shawna Gonzalez that Dr. Brett Fairy recommended that it was essential that Shawna Gonzalez return for a cpap titration after ENT evaluation and therapy. Shawna Gonzalez wishes for both a ENT referral and for a cpap titration study. I advised her that I would have our sleep lab manager call her to discuss the cpap densensitization. Shawna Gonzalez verbalized understanding.

## 2015-09-09 DIAGNOSIS — H35352 Cystoid macular degeneration, left eye: Secondary | ICD-10-CM | POA: Insufficient documentation

## 2015-09-09 DIAGNOSIS — Q148 Other congenital malformations of posterior segment of eye: Secondary | ICD-10-CM | POA: Insufficient documentation

## 2015-09-10 ENCOUNTER — Other Ambulatory Visit: Payer: 59

## 2015-09-10 ENCOUNTER — Telehealth: Payer: Self-pay

## 2015-09-10 NOTE — Progress Notes (Unsigned)
Patient came this morning for a mask fitting and desensitizing. Showed patient different masks. Respironics Wisp Small was the best fit. Had 0 leak. Pt liked this mask. Spent time explaining the importance of wearing CPAP. Also gave her suggestions that could help her get used to cpap. Like wearing during day while she sits in her recliner, Putting mask on at different times, wearing while she takes nap. She would like to have maximum ramp time on machine.Gave her my business card and told her to call me during the first week of use. Maybe I can help her through this process. She tolerated 5cm pressure while on her back and both sides with 0 leak.

## 2015-09-10 NOTE — Progress Notes (Unsigned)
Subjective:    Patient ID: Shawna Gonzalez is a 64 y.o. female.  HPI {Common ambulatory SmartLinks:[id number]}  Review of Systems  Objective:  Neurologic Exam  Physical Exam  Assessment:   ***  Plan:   ***

## 2015-09-14 NOTE — Progress Notes (Unsigned)
Subjective:    Patient ID: Shawna Gonzalez is a 64 y.o. female.  HPI   Review of Systems  Objective:  Neurologic Exam  Physical Exam  Assessment:     Plan:   Robin Hyler, RPSG

## 2015-09-14 NOTE — Telephone Encounter (Signed)
This encounter was a mistake

## 2015-10-11 DIAGNOSIS — H353221 Exudative age-related macular degeneration, left eye, with active choroidal neovascularization: Secondary | ICD-10-CM | POA: Insufficient documentation

## 2015-10-18 ENCOUNTER — Ambulatory Visit (INDEPENDENT_AMBULATORY_CARE_PROVIDER_SITE_OTHER): Payer: 59 | Admitting: Neurology

## 2015-10-18 DIAGNOSIS — G4733 Obstructive sleep apnea (adult) (pediatric): Secondary | ICD-10-CM | POA: Diagnosis not present

## 2015-10-18 NOTE — Sleep Study (Signed)
Please see the scanned sleep study interpretation located in the Procedure tab within the Chart Review section. 

## 2015-11-02 ENCOUNTER — Telehealth: Payer: Self-pay

## 2015-11-02 DIAGNOSIS — G4733 Obstructive sleep apnea (adult) (pediatric): Secondary | ICD-10-CM

## 2015-11-02 NOTE — Telephone Encounter (Signed)
Spoke with pt regarding her sleep study results. I advised her that Dr. Brett Fairy reviewed her sleep study and advises her to start an auto cpap at 10-15 cm H2O with a prolonged RAMP period. Pt wishes her RAMP period to be at least 45 minutes. She is agreeable to starting cpap. I advised her that I would send her order to [facility]. Pt verbalized understanding. Appt made for insurance purposes on 01/17/15 at 3:45.

## 2015-12-21 ENCOUNTER — Telehealth: Payer: Self-pay

## 2015-12-21 NOTE — Telephone Encounter (Signed)
Spoke to pt and advised her that her ONO on CPAP came back normal and Dr. Brett Fairy does not advise any supplemental O2 while on CPAP. Pt verbalized understanding.

## 2015-12-28 ENCOUNTER — Ambulatory Visit (HOSPITAL_COMMUNITY): Admission: RE | Admit: 2015-12-28 | Payer: PRIVATE HEALTH INSURANCE | Source: Ambulatory Visit

## 2015-12-28 ENCOUNTER — Other Ambulatory Visit (HOSPITAL_COMMUNITY): Payer: Self-pay | Admitting: Internal Medicine

## 2015-12-29 ENCOUNTER — Ambulatory Visit (HOSPITAL_COMMUNITY): Admission: RE | Admit: 2015-12-29 | Payer: PRIVATE HEALTH INSURANCE | Source: Ambulatory Visit

## 2016-01-07 ENCOUNTER — Other Ambulatory Visit (HOSPITAL_COMMUNITY): Payer: Self-pay

## 2016-01-10 ENCOUNTER — Ambulatory Visit (HOSPITAL_COMMUNITY)
Admission: RE | Admit: 2016-01-10 | Discharge: 2016-01-10 | Disposition: A | Discharge status: Home / Self Care | Payer: 59 | Source: Ambulatory Visit | Attending: Internal Medicine | Admitting: Internal Medicine

## 2016-01-10 DIAGNOSIS — M810 Age-related osteoporosis without current pathological fracture: Secondary | ICD-10-CM | POA: Diagnosis present

## 2016-01-10 MED ORDER — SODIUM CHLORIDE 0.9 % IV SOLN: Freq: Once | INTRAVENOUS | Status: DC

## 2016-01-10 MED ORDER — ZOLEDRONIC ACID 5 MG/100ML IV SOLN
INTRAVENOUS | Status: AC
  Administered 2016-01-10: 5 mg via INTRAVENOUS
  Filled 2016-01-10: qty 100

## 2016-01-10 MED ORDER — ZOLEDRONIC ACID 5 MG/100ML IV SOLN
5.0000 mg | Freq: Once | INTRAVENOUS | Status: AC
  Administered 2016-01-10: 5 mg via INTRAVENOUS

## 2016-01-18 ENCOUNTER — Ambulatory Visit (INDEPENDENT_AMBULATORY_CARE_PROVIDER_SITE_OTHER): Payer: 59 | Admitting: Neurology

## 2016-01-18 ENCOUNTER — Encounter: Payer: Self-pay | Admitting: Neurology

## 2016-01-18 VITALS — BP 132/88 | HR 82 | Resp 20 | Ht <= 58 in | Wt 228.0 lb

## 2016-01-18 DIAGNOSIS — G4733 Obstructive sleep apnea (adult) (pediatric): Secondary | ICD-10-CM

## 2016-01-18 DIAGNOSIS — J4541 Moderate persistent asthma with (acute) exacerbation: Secondary | ICD-10-CM | POA: Diagnosis not present

## 2016-01-18 DIAGNOSIS — R0902 Hypoxemia: Secondary | ICD-10-CM | POA: Diagnosis not present

## 2016-01-18 DIAGNOSIS — J45901 Unspecified asthma with (acute) exacerbation: Secondary | ICD-10-CM | POA: Insufficient documentation

## 2016-01-18 DIAGNOSIS — Z9989 Dependence on other enabling machines and devices: Secondary | ICD-10-CM

## 2016-01-18 MED ORDER — DORZOLAMIDE HCL 2 % OP SOLN: 1.0000 [drp] | Freq: Three times a day (TID) | OPHTHALMIC | Status: DC

## 2016-01-18 NOTE — Progress Notes (Signed)
S[facility]EP MED[facility]INE CL[facility]IC   Provider:  Larey Seat, M D  Referring Provider: Crist Infante, MD Primary Care Physician:  Jerlyn Ly, MD  Chief Complaint  Patient presents with  . Follow-up    cpap is going well, rm 11, alone    HPI:  Shawna Gonzalez is a 65 y.o. female , seen here as a referral  from Dr. Joylene Draft for sleep hypoxemia, question if sleep apnea is presented.   Chief complaint according to patient : I had a CPAP in the past but it didn't work well for me "I'm using oxygen at night but I have to wake up 4-5 times to go to the bathroom interrupting my sleep."  Sleep habits are as follows: [name] reports that she often falls asleep in a chair in the living room rather than in her own bed, and that she feels drowsy or excessively daytime sleepy several times a day. She also reports a very fragmented sleep wake pattern. She often wakes up from sleep because of nocturia and her bathroom breaks continue in daytime. She may go to sleep inadvertently in the recliner at about 8 or 9 PM and then transferred hour later to the bedroom proper. She may there fall asleep for an hour to 2 hours , her sleep is fragmented and these little portions. Sometimes she is too tired to sleepy to transfer from the recliner to the bedroom when she will remain all night in the recliner. In the morning she tries to rise at about 6:30 AM she prepares breakfast for family, and by 7:15 she has to leave for her work. 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 She does not have a shift work history.  [name]'s medical history is as follows. She has some morbid obesity, diabetes mellitus type 2, osteoporosis, sleep related hypoxemia with partially treated obstructive sleep apnea or untreated obstructive sleep apnea. 7 years ago she was diagnosed with a melanoma. She had a vitamin D deficiency, allergic rhinitis, and congenital nystagmus. Her family history is  positive for arthritis, diabetes, hypertension stroke and CVA. Sleep medical history and family sleep history: she was diagnosed with OSA in 2009, but could not tolerate CPAP. Social history:   The patient lives with her mother she is single and has no children, she does not drink alcohol she does not smoke she does not use recreational drugs.   01-18-16 Mrs. Muthig was just last week treated with an IV infusion for a severe asthma attack. She has asthma related sleep hypoxemia she has obstructive sleep apnea and underwent CPAP titration on 10/18/2015. She has continued to use on CPAP between 10 and 15 cm water with 37 m EPR and was a prolonged ramp time. Her download data obtained in the office look excellent. 97% compliance 6 hours and 5 minutes of daily use and a residual AHI of only 1.5. She does not have oxygen at home. She is using multiple medications and rescue inhalers for her underlying condition of asthma and hypoventilation. Her asthma exacerbated with allergies.  She is often sleeping in her recliner watching TV, and goes from there to the bed, continuing to sleep.   Review of Systems: Out of a complete 14 system review, the patient complains of only the following symptoms, and all other reviewed systems are negative. Weight gain, nocturia , urinary frequency on lasix.  Epworth score  6 from 13 pre CPAP , Fatigue severity score 37 from 45 ,  geriatric depression score 2 points.    Social History   Social History  . Marital Status: Single    Spouse Name: N/A  . Number of Children: 0  . Years of Education: 16   Occupational History  . customer service [employer]   Social History Main Topics  . Smoking status: Never Smoker   . Smokeless tobacco: Never Used  . Alcohol Use: No  . Drug Use: No  . Sexual Activity: Not on file   Other Topics Concern  . Not on file   Social History Narrative   Lives at home with mother.   Caffeine use: Diet and caffeine free soda (8 drinks  per day)    Family History  Problem Relation Age of Onset  . Colon polyps Mother   . Colon polyps Father   . Colon polyps Brother   . Heart disease Mother     Past Medical History  Diagnosis Date  . Osteoporosis   . Glaucoma   . GERD (gastroesophageal reflux disease)   . Ulcerative colitis, chronic ([place])   . Adenomatous colon polyp   . Sleep apnea   . Asthma     Past Surgical History  Procedure Laterality Date  . Cataract extraction w/ intraocular lens  implant, bilateral  2008  . De quervain's release  2008    right wrist  . Carpal tunnel release  2010    right  . Cholecystectomy  2000  . Tonsillectomy      Current Outpatient Prescriptions  Medication Sig Dispense Refill  . Albiglutide (TANZEUM) 30 MG PEN Inject 30 mg into the skin once a week.    Marland Kitchen albuterol (PROVENTIL HFA;VENTOLIN HFA) 108 (90 BASE) MCG/ACT inhaler Inhale 2 puffs into the lungs every 6 (six) hours as needed for wheezing or shortness of breath.    Marland Kitchen aspirin 81 MG chewable tablet Chew 81 mg by mouth daily.    . dorzolamide-timolol (COSOPT) 22.3-6.8 MG/ML ophthalmic solution Apply to eye.    . esomeprazole (NEXIUM) 40 MG capsule Take 40 mg by mouth daily before breakfast.    . fexofenadine (ALLEGRA) 180 MG tablet Take 180 mg by mouth daily.    . furosemide (LASIX) 40 MG tablet Take 40 mg by mouth daily.  4  . gabapentin (NEURONTIN) 300 MG capsule Take 1 tab a day x3 days, then increase to 1 tab twice daily x3 days, then increase to 1 tab three times daily and continue. 100 capsule 2  . losartan (COZAAR) 50 MG tablet Take 50 mg by mouth daily.    . Pediatric Multivit-Minerals-C (MULTIVITAMIN/IRON) 60 MG CHEW Chew 1 tablet by mouth daily.    . potassium chloride SA (K-DUR,KLOR-CON) 20 MEQ tablet Take 20 mEq by mouth 3 (three) times a week.    . Travoprost, BAK Free, (TRAVATAN Z) 0.004 % SOLN ophthalmic solution PLACE 1 DROP INTO BOTH EYES NIGHTLY    . Vitamin D, Ergocalciferol, (DRISDOL) 50000 UNITS CAPS  Take 50,000 Units by mouth 2 (two) times a week.     . zoledronic acid (RECLAST) 5 MG/100ML SOLN Inject 5 mg into the vein once. Once a year     No current facility-administered medications for this visit.    Allergies as of 01/18/2016 - Review Complete 01/18/2016  Allergen Reaction Noted  . Amlodipine  08/03/2015  . Bydureon [exenatide]  08/03/2015  . Lisinopril  08/03/2015  . Lunesta [eszopiclone] Other (See Comments) 08/03/2015  . Sulfa antibiotics Rash 01/09/2012    Vitals: BP  132/88 mmHg  Pulse 82  Resp 20  Ht 4\' 10"  (1.473 m)  Wt 228 lb (103.42 kg)  BMI 47.66 kg/m2 Last Weight:  Wt Readings from Last 1 Encounters:  01/18/16 228 lb (103.42 kg)   [id number] mass index is 47.66 kg/(m^2).     Last Height:   Ht Readings from Last 1 Encounters:  01/18/16 4\' 10"  (1.473 m)    Physical exam:  General: The patient is awake, alert and appears not in acute distress. The patient is well groomed. Head: Normocephalic, atraumatic. Neck is supple. Mallampati 4 ,  neck circumference: 18.5 . Nasal airflow restricted , TMJ is not  evident . Retrognathia is seen.  Cardiovascular:  Regular rate and rhythm , without  murmurs or carotid bruit, and without distended neck veins. Respiratory: Lungs are clear to auscultation. Skin:  Without evidence of edema, or rash Trunk: BMI is 50 . The patient's abdomen and torso is most affected   Neurologic exam : The patient is awake and alert, oriented to place and time.   Memory subjective described as intact.  Attention span & concentration ability appears normal.  Speech is fluent,  without   dysarthria, dysphonia or aphasia. She is short of breath while speaking.  Mood and affect are appropriate.  Cranial nerves: Pupils are equal and briskly reactive to light. Funduscopic exam without evidence of pallor or edema. Extraocular movements : primary  Nystagmus !   Visual fields by finger perimetry are intact. Hearing to finger rub intact.   Facial  sensation intact to fine touch.  Facial motor strength is symmetric and tongue and uvula move midline. Shoulder shrug was symmetrical.   Motor exam:  Normal tone, muscle bulk and symmetric strength in all extremities.  Sensory:  Fine touch, pinprick and vibration were tested in all extremities. Proprioception tested in the upper extremities was normal.   The patient was advised of the nature of the diagnosed sleep disorder , the treatment options and risks for general a health and wellness arising from not treating the condition.  I spent more than 15  minutes of face to face time with the patient. Greater than 50% of time was spent in counseling and coordination of care. We have discussed the diagnosis and differential and I answered the patient's questions.  Mrs. Mcbrayer has undergone several pulse oximetries at home with and without oxygen but I don't have the original records here to review. Per her report the oxygen does make a difference is currently at 2 L nasal cannula. Our goal is today to verify how much hypoxemia is there and is the hypoxemia could be treated with a positive airway pressure or not versus if the current level of oxygen sufficient to treat her.   Assessment:  After physical and neurologic examination, review of laboratory studies,  Personal review of imaging studies, reports of other /same  Imaging studies ,  Results of polysomnography/ neurophysiology testing and pre-existing records as far as provided in visit., my assessment is    1) Asthma and OSA overlapping syndrome. reported chronic sleep related hypoxemia, and then asthmatic and morbidly obese patient.  Her obesity is truncal affecting her diaphragmatic strength and her chest wall movements.   2) The patient still has obstructive sleep apnea but I think she may have a complex form of sleep apnea. Barrel chested . Hypoventilating with low oxygen and likely high Co2.   3) Her main risk factor is her body mass  index, thus far she has  not been on a medically supervised diet for weight loss program but she could definitely be a candidate for such. Given her current body mass index and the  high BMI she could be a candidate for bariatric surgery.  Plan:  Treatment plan and additional workup :  Continuing use of CPAP- with high compliance.  Needs no additional oxygen according to ONO obtained on CPAP. 12-03-15. Treatment of asthma through Dr. Joylene Draft.     Asencion Partridge Arian Murley MD  01/18/2016   CC: Crist Infante, [address], [place] [address]

## 2016-01-20 ENCOUNTER — Telehealth: Payer: Self-pay | Admitting: Cardiovascular Disease

## 2016-01-20 NOTE — Telephone Encounter (Signed)
Received records from [facility] for appointment on 01/26/16 with Dr Gwenlyn Found.  Records given to [facility] (medical records) for Dr Kennon Holter schedule on 01/26/16. lp

## 2016-01-26 ENCOUNTER — Encounter: Payer: Self-pay | Admitting: Cardiovascular Disease

## 2016-01-26 ENCOUNTER — Ambulatory Visit (INDEPENDENT_AMBULATORY_CARE_PROVIDER_SITE_OTHER): Payer: 59 | Admitting: Cardiovascular Disease

## 2016-01-26 VITALS — BP 160/90 | HR 80 | Ht <= 58 in | Wt 227.0 lb

## 2016-01-26 DIAGNOSIS — I10 Essential (primary) hypertension: Secondary | ICD-10-CM | POA: Diagnosis not present

## 2016-01-26 DIAGNOSIS — R0789 Other chest pain: Secondary | ICD-10-CM | POA: Insufficient documentation

## 2016-01-26 DIAGNOSIS — R079 Chest pain, unspecified: Secondary | ICD-10-CM

## 2016-01-26 NOTE — Assessment & Plan Note (Signed)
History of hypertension on losartan blood pressure measured at 160/90. Usually her blood pressures lower than this. TE current medications

## 2016-01-26 NOTE — Assessment & Plan Note (Signed)
Shawna Gonzalez was referred by Dr. Joylene Draft for evaluation of atypical chest pain. She had been seen by Dr. Rex Kras in the past and had a Myoview stress test performed 02/01/12 which was low risk. Her pain lasts for hours at time and is nonanginal. She is on PPI indications. I reassured her that the likelihood that her pain is ischemic as small. I do not see the utility of repeating a Myoview stress test. She'll follow back up with Dr. Joylene Draft . I will see her back in 6 months

## 2016-01-26 NOTE — Patient Instructions (Signed)
Dr Gwenlyn Found recommends that you schedule a follow-up appointment in 6 months. You will receive a reminder letter in the mail two months in advance. If you don't receive a letter, please call our office to schedule the follow-up appointment.  If you need a refill on your cardiac medications before your next appointment, please call your pharmacy.

## 2016-01-26 NOTE — Progress Notes (Signed)
01/26/2016 Shawna Gonzalez   1951/02/27  [id number]  Primary Physician Shawna Ly, MD Primary Cardiologist: Shawna Harp MD Shawna Gonzalez   HPI:  Shawna Gonzalez is a 65 year old severely overweight single Caucasian female children who works at [employer] eferred by Dr. Joylene Gonzalez for cardiovascular evaluation because of atypical chest pain. Her risk factors include treated hypertension. She saw Dr. Rex Gonzalez 4 years ago and had a Myoview 02/01/12 which was low risk. Her pain is constant. She is on PPI medications. There are no anginal components. She has never had a heart attack or stroke. She has no other risk factors.   Current Outpatient Prescriptions  Medication Sig Dispense Refill  . Albiglutide (TANZEUM) 30 MG PEN Inject 30 mg into the skin once a week.    Marland Kitchen albuterol (PROVENTIL HFA;VENTOLIN HFA) 108 (90 BASE) MCG/ACT inhaler Inhale 2 puffs into the lungs every 6 (six) hours as needed for wheezing or shortness of breath.    Marland Kitchen aspirin 81 MG chewable tablet Chew 81 mg by mouth daily.    . dorzolamide (TRUSOPT) 2 % ophthalmic solution Place 1 drop into both eyes 3 (three) times daily. 10 mL 12  . esomeprazole (NEXIUM) 40 MG capsule Take 40 mg by mouth daily before breakfast.    . fexofenadine (ALLEGRA) 180 MG tablet Take 180 mg by mouth daily.    . furosemide (LASIX) 40 MG tablet Take 40 mg by mouth daily.  4  . losartan (COZAAR) 50 MG tablet Take 50 mg by mouth daily.    . ondansetron (ZOFRAN) 4 MG tablet Take 4 mg by mouth every 8 (eight) hours as needed for nausea or vomiting.    . Pediatric Multivit-Minerals-C (MULTIVITAMIN/IRON) 60 MG CHEW Chew 1 tablet by mouth daily.    . potassium chloride SA (K-DUR,KLOR-CON) 20 MEQ tablet Take 20 mEq by mouth 3 (three) times a week.    . Travoprost, BAK Free, (TRAVATAN Z) 0.004 % SOLN ophthalmic solution PLACE 1 DROP INTO BOTH EYES NIGHTLY    . Vitamin D, Ergocalciferol, (DRISDOL) 50000 UNITS CAPS Take 50,000 Units by mouth 2 (two)  times a week.     . zoledronic acid (RECLAST) 5 MG/100ML SOLN Inject 5 mg into the vein once. Once a year     No current facility-administered medications for this visit.    Allergies  Allergen Reactions  . Amlodipine   . Bydureon [Exenatide]   . Lisinopril   . Lunesta [Eszopiclone] Other (See Comments)  . Sulfa Antibiotics Rash    Social History   Social History  . Marital Status: Single    Spouse Name: N/A  . Number of Children: 0  . Years of Education: 16   Occupational History  . customer service [employer]   Social History Main Topics  . Smoking status: Never Smoker   . Smokeless tobacco: Never Used  . Alcohol Use: No  . Drug Use: No  . Sexual Activity: Not on file   Other Topics Concern  . Not on file   Social History Narrative   Lives at home with mother.   Caffeine use: Diet and caffeine free soda (8 drinks per day)     Review of Systems: General: negative for chills, fever, night sweats or weight changes.  Cardiovascular: negative for chest pain, dyspnea on exertion, edema, orthopnea, palpitations, paroxysmal nocturnal dyspnea or shortness of breath Dermatological: negative for rash Respiratory: negative for cough or wheezing Urologic: negative for hematuria Abdominal: negative for nausea, vomiting, diarrhea, bright  red blood per rectum, melena, or hematemesis Neurologic: negative for visual changes, syncope, or dizziness All other systems reviewed and are otherwise negative except as noted above.    Blood pressure 160/90, pulse 80, height 4\' 10"  (1.473 m), weight 227 lb (102.967 kg).  General appearance: alert and no distress Neck: no adenopathy, no carotid bruit, no JVD, supple, symmetrical, trachea midline and thyroid not enlarged, symmetric, no tenderness/mass/nodules Lungs: clear to auscultation bilaterally Heart: regular rate and rhythm, S1, S2 normal, no murmur, click, rub or gallop Extremities: extremities normal, atraumatic, no cyanosis or  edema  EKG sinus rhythm at 83 without ST or T-wave changes. I personally reviewed this EKG  ASSESSMENT AND PLAN:   Atypical chest pain Shawna Gonzalez was referred by Dr. Joylene Gonzalez for evaluation of atypical chest pain. She had been seen by Dr. Rex Gonzalez in the past and had a Myoview stress test performed 02/01/12 which was low risk. Her pain lasts for hours at time and is nonanginal. She is on PPI indications. I reassured her that the likelihood that her pain is ischemic as small. I do not see the utility of repeating a Myoview stress test. She'll follow back up with Dr. Joylene Gonzalez . I will see her back in 6 months  Essential hypertension History of hypertension on losartan blood pressure measured at 160/90. Usually her blood pressures lower than this. TE current medications      Shawna Harp MD [facility] 01/26/2016 12:05 PM

## 2016-10-12 ENCOUNTER — Encounter: Payer: Self-pay | Admitting: Gastroenterology

## 2016-10-12 ENCOUNTER — Ambulatory Visit (INDEPENDENT_AMBULATORY_CARE_PROVIDER_SITE_OTHER): Payer: 59 | Admitting: Gastroenterology

## 2016-10-12 ENCOUNTER — Other Ambulatory Visit (INDEPENDENT_AMBULATORY_CARE_PROVIDER_SITE_OTHER): Payer: 59

## 2016-10-12 VITALS — BP 112/70 | HR 78 | Ht <= 58 in | Wt 192.8 lb

## 2016-10-12 DIAGNOSIS — R140 Abdominal distension (gaseous): Secondary | ICD-10-CM

## 2016-10-12 DIAGNOSIS — R1084 Generalized abdominal pain: Secondary | ICD-10-CM

## 2016-10-12 DIAGNOSIS — R112 Nausea with vomiting, unspecified: Secondary | ICD-10-CM

## 2016-10-12 DIAGNOSIS — R110 Nausea: Secondary | ICD-10-CM | POA: Insufficient documentation

## 2016-10-12 LAB — BASIC METABOLIC PANEL
BUN: 11 mg/dL (ref 6–23)
CO2: 30 mEq/L (ref 19–32)
Calcium: 9.1 mg/dL (ref 8.4–10.5)
Chloride: 103 mEq/L (ref 96–112)
Creatinine, Ser: 0.83 mg/dL (ref 0.40–1.20)
GFR: 73.39 mL/min (ref >60.00)
Glucose, Bld: 106 mg/dL — ABNORMAL HIGH (ref 70–99)
Potassium: 3.7 mEq/L (ref 3.5–5.1)
Sodium: 141 mEq/L (ref 135–145)

## 2016-10-12 NOTE — Progress Notes (Signed)
10/12/2016 Shawna Gonzalez [id number] [birth year]   HISTORY OF PRESENT ILLNESS:  This is a 65 year old female who is previously known to Dr. Olevia Perches. Her care will be assumed by Dr. Silverio Decamp. Her last colonoscopy was in February 2013 at which time she is found have 2 polyps that were removed and were hyperplastic on pathology. Also found to have diverticulosis. Was put in for 10 year colonoscopy recall. Her last EGD was in May 2008 which time she is found have a hiatal hernia. Small bowel biopsies and esophageal biopsies were normal. Gastric biopsy showed benign gastric-type mucosa with mild chronic gastritis.  She presents to our office today at the request of her PCP, Dr. Joylene Draft, for evaluation of some somewhat vague abdominal complaints. She complains of constant nausea, bloating, and diffuse/generalized abdominal discomfort for the past several months.  She tells me that overall her symptoms are constant, present every day, unaffected by eating any specific foods, do not keep her from sleep.  She denies any vomiting. Says that Zofran does not help with her symptoms. She had been on Nexium 40 mg daily and that has been increased to twice a day as well as Zantac 150 mg added initially once, then twice a day over the past several months by her PCP without improvement. She denies any constipation.  Occasionally has some loose stools. Denies any dark or bloody stools. She says that she's not tried anything else over-the-counter for her symptoms. She describes her abdominal discomfort as burning/cramping.  Some days symptoms are worse than others.   Past Medical History:  Diagnosis Date  . Adenomatous colon polyp   . Asthma   . Atypical chest pain   . GERD (gastroesophageal reflux disease)   . Glaucoma   . Hypertension   . Osteoporosis   . Sleep apnea   . Ulcerative colitis, chronic ([place])    Past Surgical History:  Procedure Laterality Date  . CARPAL TUNNEL RELEASE  2010   right  .  CATARACT EXTRACTION W/ INTRAOCULAR LENS  IMPLANT, BILATERAL  2008  . CHOLECYSTECTOMY  2000  . DE QUERVAIN'S RELEASE  2008   right wrist  . TONSILLECTOMY      reports that she has never smoked. She has never used smokeless tobacco. She reports that she does not drink alcohol or use drugs. family history includes Colon polyps in her brother, father, and mother; Heart disease in her mother. Allergies  Allergen Reactions  . Amlodipine   . Bydureon [Exenatide]   . Lisinopril   . Lunesta [Eszopiclone] Other (See Comments)  . Sulfa Antibiotics Rash      Outpatient Encounter Prescriptions as of 10/12/2016  Medication Sig  . Albiglutide (TANZEUM) 30 MG PEN Inject 30 mg into the skin once a week.  Marland Kitchen albuterol (PROVENTIL HFA;VENTOLIN HFA) 108 (90 BASE) MCG/ACT inhaler Inhale 2 puffs into the lungs every 6 (six) hours as needed for wheezing or shortness of breath.  Marland Kitchen aspirin 81 MG chewable tablet Chew 81 mg by mouth daily.  . dorzolamide (TRUSOPT) 2 % ophthalmic solution Place 1 drop into both eyes 3 (three) times daily.  Marland Kitchen esomeprazole (NEXIUM) 40 MG capsule Take 40 mg by mouth 2 (two) times daily before a meal.   . fexofenadine (ALLEGRA) 180 MG tablet Take 180 mg by mouth daily.  . furosemide (LASIX) 40 MG tablet Take 40 mg by mouth daily.  Marland Kitchen losartan (COZAAR) 50 MG tablet Take 50 mg by mouth daily.  . ondansetron (ZOFRAN) 4  MG tablet Take 4 mg by mouth every 8 (eight) hours as needed for nausea or vomiting.  . Pediatric Multivit-Minerals-C (MULTIVITAMIN/IRON) 60 MG CHEW Chew 1 tablet by mouth daily.  . potassium chloride SA (K-DUR,KLOR-CON) 20 MEQ tablet Take 20 mEq by mouth 3 (three) times a week.  . ranitidine (ZANTAC) 150 MG capsule Take 150 mg by mouth 2 (two) times daily.  . Travoprost, BAK Free, (TRAVATAN Z) 0.004 % SOLN ophthalmic solution PLACE 1 DROP INTO BOTH EYES NIGHTLY  . Vitamin D, Ergocalciferol, (DRISDOL) 50000 UNITS CAPS Take 50,000 Units by mouth 2 (two) times a week.   .  zoledronic acid (RECLAST) 5 MG/100ML SOLN Inject 5 mg into the vein once. Once a year   No facility-administered encounter medications on file as of 10/12/2016.      REVIEW OF SYSTEMS  : All other systems reviewed and negative except where noted in the History of Present Illness.   PHYSICAL EXAM: BP 112/70   Pulse 78   Ht 4\' 10"  (1.473 m)   Wt 192 lb 12.8 oz (87.5 kg)   BMI 40.30 kg/m  General: Well developed white female in no acute distress Head: Normocephalic and atraumatic Eyes:  Sclerae anicteric, conjunctiva pink. Ears: Normal auditory acuity Lungs: Clear throughout to auscultation Heart: Regular rate and rhythm Abdomen: Soft, non-distended.  Normal bowel sounds.  Mild diffuse TTP. Musculoskeletal: Symmetrical with no gross deformities  Skin: No lesions on visible extremities Extremities: No edema  Neurological: Alert oriented x 4, grossly non-focal Psychological:  Alert and cooperative. Normal mood and affect  ASSESSMENT AND PLAN: -65 year old female with somewhat vague, nonspecific complaints of diffuse abdominal pain, bloating, nausea. I'm unsure currently as to the cause of her symptoms. I willstart by ordering a CT scan of the abdomen and pelvis with contrast. Will need a BMP. Pending results of that she may need endoscopy to evaluate specifically the nausea. Question if this is IBS. I have asked her to just try some over-the-counter IBgard as symptomatic treatment in the interim.     CC:  Crist Infante, MD

## 2016-10-12 NOTE — Patient Instructions (Addendum)
You have been scheduled for a CT scan of the abdomen and pelvis at [facility] ([address]---this is in the same building as Press [facility]).   You are scheduled on 10/18/16 at 1130 am. You should arrive 15 minutes prior to your appointment time for registration. Please follow the written instructions below on the day of your exam:  WARNING: IF YOU ARE ALLERGIC TO IODINE/X-RAY DYE, PLEASE NOTIFY RADIOLOGY IMMEDIATELY AT [phone]! YOU WILL BE GIVEN A 13 HOUR PREMEDICATION PREP.  1) Do not eat or drink anything after 730 am (4 hours prior to your test) 2) You have been given 2 bottles of oral contrast to drink. The solution may taste better if refrigerated, but do NOT add ice or any other liquid to this solution. Shake well before drinking.    Drink 1 bottle of contrast @ 930 am (2 hours prior to your exam)  Drink 1 bottle of contrast @ 1030 am (1 hour prior to your exam)  You may take any medications as prescribed with a small amount of water except for the following: Metformin, Glucophage, Glucovance, Avandamet, Riomet, Fortamet, Actoplus Met, Janumet, Glumetza or Metaglip. The above medications must be held the day of the exam AND 48 hours after the exam.  The purpose of you drinking the oral contrast is to aid in the visualization of your intestinal tract. The contrast solution may cause some diarrhea. Before your exam is started, you will be given a small amount of fluid to drink. Depending on your individual set of symptoms, you may also receive an intravenous injection of x-ray contrast/dye. Plan on being at [facility] for 30 minutes or longer, depending on the type of exam you are having performed.  This test typically takes 30-45 minutes to complete.  If you have any questions regarding your exam or if you need to reschedule, you may call the CT department at [phone] between the hours of 8:00 am and 5:00 pm,  Monday-Friday.  ________________________________________________________________________ Your physician has requested that you go to the basement for the following lab work before leaving today:  BMET Please begin IB guard over the counter, coupons given to you today.

## 2016-10-13 NOTE — Progress Notes (Signed)
Reviewed and agree with documentation and assessment and plan. K. Veena Brent Noto , MD   

## 2016-10-18 ENCOUNTER — Ambulatory Visit (INDEPENDENT_AMBULATORY_CARE_PROVIDER_SITE_OTHER)
Admission: RE | Admit: 2016-10-18 | Discharge: 2016-10-18 | Disposition: A | Discharge status: Home / Self Care | Payer: 59 | Source: Ambulatory Visit | Attending: Gastroenterology | Admitting: Gastroenterology

## 2016-10-18 ENCOUNTER — Telehealth: Payer: Self-pay

## 2016-10-18 DIAGNOSIS — R112 Nausea with vomiting, unspecified: Secondary | ICD-10-CM

## 2016-10-18 DIAGNOSIS — R1084 Generalized abdominal pain: Secondary | ICD-10-CM | POA: Diagnosis not present

## 2016-10-18 DIAGNOSIS — R140 Abdominal distension (gaseous): Secondary | ICD-10-CM

## 2016-10-18 MED ORDER — IOPAMIDOL (ISOVUE-300) INJECTION 61%
100.0000 mL | Freq: Once | INTRAVENOUS | Status: AC | PRN
  Administered 2016-10-18: 100 mL via INTRAVENOUS

## 2016-10-18 NOTE — Telephone Encounter (Signed)
Jess we received a call report on the CT from today, please review and advise.

## 2016-10-19 NOTE — Telephone Encounter (Signed)
See alternate note  

## 2016-12-18 DIAGNOSIS — H6690 Otitis media, unspecified, unspecified ear: Secondary | ICD-10-CM

## 2016-12-18 DIAGNOSIS — B9689 Other specified bacterial agents as the cause of diseases classified elsewhere: Secondary | ICD-10-CM

## 2016-12-18 HISTORY — DX: Other specified bacterial agents as the cause of diseases classified elsewhere: B96.89

## 2016-12-18 HISTORY — DX: Otitis media, unspecified, unspecified ear: H66.90

## 2016-12-26 NOTE — H&P (Signed)
66 year old G 0 PMP with lower abdominal pain, nausea and vomiting. CT scan in November showed 10 cm left adnexal fibroid.  Past Medical History:  Diagnosis Date  . Adenomatous colon polyp   . Asthma   . Atypical chest pain   . GERD (gastroesophageal reflux disease)   . Glaucoma   . Hypertension   . Osteoporosis   . Sleep apnea   . Ulcerative colitis, chronic ([place])    Past Surgical History:  Procedure Laterality Date  . CARPAL TUNNEL RELEASE  2010   right  . CATARACT EXTRACTION W/ INTRAOCULAR LENS  IMPLANT, BILATERAL  2008  . CHOLECYSTECTOMY  2000  . DE QUERVAIN'S RELEASE  2008   right wrist  . TONSILLECTOMY     Prior to Admission medications   Medication Sig Start Date End Date Taking? Authorizing Provider  albuterol (PROVENTIL HFA;VENTOLIN HFA) 108 (90 BASE) MCG/ACT inhaler Inhale 2 puffs into the lungs every 6 (six) hours as needed for wheezing or shortness of breath.   Yes Historical Provider, MD  aspirin 81 MG chewable tablet Chew 81 mg by mouth daily.   Yes Historical Provider, MD  budesonide-formoterol (SYMBICORT) 160-4.5 MCG/ACT inhaler Inhale 2 puffs into the lungs daily.   Yes Historical Provider, MD  dorzolamide (TRUSOPT) 2 % ophthalmic solution Place 1 drop into both eyes 3 (three) times daily. Patient taking differently: Place 1 drop into both eyes 2 (two) times daily.  01/18/16  Yes Carmen Dohmeier, MD  esomeprazole (NEXIUM) 40 MG capsule Take 40 mg by mouth every evening.    Yes Historical Provider, MD  fexofenadine (ALLEGRA) 180 MG tablet Take 180 mg by mouth every evening.    Yes Historical Provider, MD  furosemide (LASIX) 40 MG tablet Take 40 mg by mouth every evening.  07/24/15  Yes Historical Provider, MD  losartan (COZAAR) 50 MG tablet Take 50 mg by mouth every evening.    Yes Historical Provider, MD  Multiple Vitamins-Minerals (MULTIVITAMIN GUMMIES ADULT) CHEW Chew 2 tablets by mouth every evening.   Yes Historical Provider, MD  naproxen sodium (ANAPROX) 220 MG  tablet Take 220 mg by mouth 2 (two) times daily as needed (for pain.).   Yes Historical Provider, MD  potassium chloride SA (K-DUR,KLOR-CON) 20 MEQ tablet Take 20 mEq by mouth every Tuesday, Thursday, and Saturday at 6 PM.    Yes Historical Provider, MD  Travoprost, BAK Free, (TRAVATAN Z) 0.004 % SOLN ophthalmic solution PLACE 1 DROP INTO BOTH EYES NIGHTLY 12/14/15  Yes Historical Provider, MD  Vitamin D, Ergocalciferol, (DRISDOL) 50000 UNITS CAPS Take 50,000 Units by mouth 2 (two) times a week. Sunday & Thursday   Yes Historical Provider, MD   Allergies. Amlodipine; Bydureon [exenatide]; Lisinopril; Lunesta [eszopiclone]; and Sulfa antibiotics  Family History  Problem Relation Age of Onset  . Colon polyps Mother   . Heart disease Mother   . Colon polyps Father   . Colon polyps Brother    Social History   Social History  . Marital status: Single    Spouse name: N/A  . Number of children: 0  . Years of education: 64   Occupational History  . customer service [employer]   Social History Main Topics  . Smoking status: Never Smoker  . Smokeless tobacco: Never Used  . Alcohol use No  . Drug use: No  . Sexual activity: Not on file   Other Topics Concern  . Not on file   Social History Narrative   Lives at home  with mother.   Caffeine use: Diet and caffeine free soda (8 drinks per day)         Epworth Sleepiness Scale = 7 (as of 01/26/2016)   ROS unremarkable  There were no vitals taken for this visit. General alert and oriented Lung CTA B  Abdomen weight 190 She does have a pannus Pelvic exam - nulliparous very narrow vaginal  IMPRESSION: Symptomatic fibroids TAH and BSO Risks of surgery reviewed with patient Patient agrees to proceed

## 2016-12-26 NOTE — Patient Instructions (Addendum)
Your procedure is scheduled on:  Tuesday, Jan. 23, 2018  Enter through the [employer] [facility] at:  6:00 AM  Pick up the phone at the desk and dial [phone].  Call this number if you have problems the morning of surgery: [phone].  Remember: Do NOT eat food or drink after:  Midnight Monday, Jan. 22, 2018  Take these medicines the morning of surgery with a SIP OF WATER: None  Bring Asthma Inhaler day of surgery  Bring CPAP machine day of surgery  Stop ALL herbal medications at this time  Do NOT smoke the day of surgery.  Do NOT wear jewelry (body piercing), metal hair clips/bobby pins, make-up, or nail polish. Do NOT wear lotions, powders, or perfumes.  You may wear deodorant. Do NOT shave for 48 hours prior to surgery. Do NOT bring valuables to the hospital. Contacts, dentures, or bridgework may not be worn into surgery.  Leave suitcase in car.  After surgery it may be brought to your room.  For patients admitted to the hospital, checkout time is 11:00 AM the day of discharge.  Bring a copy of your healthcare power of attorney and living will documents.

## 2016-12-27 ENCOUNTER — Encounter (HOSPITAL_COMMUNITY)
Admission: RE | Admit: 2016-12-27 | Discharge: 2016-12-27 | Disposition: A | Discharge status: Home / Self Care | Payer: 59 | Source: Ambulatory Visit | Attending: Obstetrics and Gynecology | Admitting: Obstetrics and Gynecology

## 2016-12-27 ENCOUNTER — Encounter (HOSPITAL_COMMUNITY): Payer: Self-pay

## 2016-12-27 DIAGNOSIS — D259 Leiomyoma of uterus, unspecified: Secondary | ICD-10-CM | POA: Diagnosis not present

## 2016-12-27 DIAGNOSIS — Z01812 Encounter for preprocedural laboratory examination: Secondary | ICD-10-CM | POA: Insufficient documentation

## 2016-12-27 HISTORY — DX: Anemia, unspecified: D64.9

## 2016-12-27 HISTORY — DX: Unspecified macular degeneration: H35.30

## 2016-12-27 HISTORY — DX: Type 2 diabetes mellitus without complications: E11.9

## 2016-12-27 HISTORY — DX: Other specified bacterial agents as the cause of diseases classified elsewhere: B96.89

## 2016-12-27 HISTORY — DX: Cardiac murmur, unspecified: R01.1

## 2016-12-27 HISTORY — DX: Otitis media, unspecified, unspecified ear: H66.90

## 2016-12-27 HISTORY — DX: Malignant (primary) neoplasm, unspecified: C80.1

## 2016-12-27 HISTORY — DX: Unspecified osteoarthritis, unspecified site: M19.90

## 2016-12-27 LAB — CBC
HCT: 44.8 % (ref 36.0–46.0)
Hemoglobin: 15.4 g/dL — ABNORMAL HIGH (ref 12.0–15.0)
MCH: 28.3 pg (ref 26.0–34.0)
MCHC: 34.4 g/dL (ref 30.0–36.0)
MCV: 82.2 fL (ref 78.0–100.0)
Platelets: 243 10*3/uL (ref 150–400)
RBC: 5.45 MIL/uL — ABNORMAL HIGH (ref 3.87–5.11)
RDW: 14.3 % (ref 11.5–15.5)
WBC: 15.4 10*3/uL — ABNORMAL HIGH (ref 4.0–10.5)

## 2016-12-27 LAB — BASIC METABOLIC PANEL
Anion gap: 10 (ref 5–15)
BUN: 18 mg/dL (ref 6–20)
CO2: 27 mmol/L (ref 22–32)
Calcium: 8.7 mg/dL — ABNORMAL LOW (ref 8.9–10.3)
Chloride: 99 mmol/L — ABNORMAL LOW (ref 101–111)
Creatinine, Ser: 0.64 mg/dL (ref 0.44–1.00)
GFR calc Af Amer: >60 mL/min (ref >60)
GFR calc non Af Amer: >60 mL/min (ref >60)
Glucose, Bld: 100 mg/dL — ABNORMAL HIGH (ref 65–99)
Potassium: 3.6 mmol/L (ref 3.5–5.1)
Sodium: 136 mmol/L (ref 135–145)

## 2016-12-27 LAB — TYPE AND SCREEN
ABO/RH(D): O POS
Antibody Screen: NEGATIVE

## 2016-12-27 LAB — ABO/RH: ABO/RH(D): O POS

## 2017-01-08 NOTE — Anesthesia Preprocedure Evaluation (Addendum)
Anesthesia Evaluation  Patient identified by MRN, date of birth, ID band Patient awake    Reviewed: Allergy & Precautions, H&P , Patient's Chart, lab work & pertinent test results, reviewed documented beta blocker date and time   Airway Mallampati: III  TM Distance: >3 FB Neck ROM: full    Dental no notable dental hx.    Pulmonary asthma , sleep apnea ,    Pulmonary exam normal breath sounds clear to auscultation       Cardiovascular hypertension, On Medications  Rhythm:regular Rate:Normal     Neuro/Psych    GI/Hepatic   Endo/Other  diabetesMorbid obesity  Renal/GU      Musculoskeletal   Abdominal   Peds  Hematology   Anesthesia Other Findings GERD Sleep apnea   Asthma; had URI 2 weeks ago. Feels well now and not using inhaler much. Was treated with steroids; currently not taking prednisone. No wheezes noted on exam. Offered regional anesthesia to avoid intubation. Pt opts for general despite small but unquantifiable increased risk of pulmonary complications. I don't think cancelling her case is warranted.     Hypertension   MO Diabetes mellitus without complication ([place])     Reproductive/Obstetrics                            Anesthesia Physical Anesthesia Plan  ASA: III  Anesthesia Plan: General   Post-op Pain Management:    Induction: Intravenous  Airway Management Planned: Oral ETT  Additional Equipment:   Intra-op Plan:   Post-operative Plan: Extubation in OR  Informed Consent: I have reviewed the patients History and Physical, chart, labs and discussed the procedure including the risks, benefits and alternatives for the proposed anesthesia with the patient or authorized representative who has indicated his/her understanding and acceptance.   Dental Advisory Given and Dental advisory given  Plan Discussed with: CRNA and Surgeon  Anesthesia Plan Comments: (  Discussed  general anesthesia, including possible nausea, instrumentation of airway, sore throat,pulmonary aspiration, etc. I asked if the were any outstanding questions, or  concerns before we proceeded.)        Anesthesia Quick Evaluation

## 2017-01-09 ENCOUNTER — Inpatient Hospital Stay (HOSPITAL_COMMUNITY): Payer: 59 | Admitting: Anesthesiology

## 2017-01-09 ENCOUNTER — Inpatient Hospital Stay (HOSPITAL_COMMUNITY)
Admission: RE | Admit: 2017-01-09 | Discharge: 2017-01-11 | DRG: 743 | Disposition: A | Discharge status: Home / Self Care | Payer: 59 | Source: Ambulatory Visit | Attending: Obstetrics and Gynecology | Admitting: Obstetrics and Gynecology

## 2017-01-09 ENCOUNTER — Encounter (HOSPITAL_COMMUNITY): Payer: Self-pay

## 2017-01-09 ENCOUNTER — Encounter (HOSPITAL_COMMUNITY)
Admission: RE | Disposition: A | Discharge status: Home / Self Care | Payer: Self-pay | Source: Ambulatory Visit | Attending: Obstetrics and Gynecology

## 2017-01-09 DIAGNOSIS — Z6839 Body mass index (BMI) 39.0-39.9, adult: Secondary | ICD-10-CM

## 2017-01-09 DIAGNOSIS — Z90722 Acquired absence of ovaries, bilateral: Secondary | ICD-10-CM

## 2017-01-09 DIAGNOSIS — I10 Essential (primary) hypertension: Secondary | ICD-10-CM | POA: Diagnosis present

## 2017-01-09 DIAGNOSIS — G4730 Sleep apnea, unspecified: Secondary | ICD-10-CM | POA: Diagnosis present

## 2017-01-09 DIAGNOSIS — Z90710 Acquired absence of both cervix and uterus: Secondary | ICD-10-CM

## 2017-01-09 DIAGNOSIS — R1030 Lower abdominal pain, unspecified: Secondary | ICD-10-CM | POA: Diagnosis present

## 2017-01-09 DIAGNOSIS — J45909 Unspecified asthma, uncomplicated: Secondary | ICD-10-CM | POA: Diagnosis present

## 2017-01-09 DIAGNOSIS — Z9079 Acquired absence of other genital organ(s): Secondary | ICD-10-CM

## 2017-01-09 DIAGNOSIS — K219 Gastro-esophageal reflux disease without esophagitis: Secondary | ICD-10-CM | POA: Diagnosis present

## 2017-01-09 DIAGNOSIS — D282 Benign neoplasm of uterine tubes and ligaments: Principal | ICD-10-CM | POA: Diagnosis present

## 2017-01-09 HISTORY — PX: HYSTERECTOMY ABDOMINAL WITH SALPINGECTOMY: SHX6725

## 2017-01-09 LAB — BASIC METABOLIC PANEL
Anion gap: 5 (ref 5–15)
BUN: 13 mg/dL (ref 6–20)
CO2: 26 mmol/L (ref 22–32)
Calcium: 8 mg/dL — ABNORMAL LOW (ref 8.9–10.3)
Chloride: 103 mmol/L (ref 101–111)
Creatinine, Ser: 0.62 mg/dL (ref 0.44–1.00)
GFR calc Af Amer: >60 mL/min (ref >60)
GFR calc non Af Amer: >60 mL/min (ref >60)
Glucose, Bld: 154 mg/dL — ABNORMAL HIGH (ref 65–99)
Potassium: 3.3 mmol/L — ABNORMAL LOW (ref 3.5–5.1)
Sodium: 134 mmol/L — ABNORMAL LOW (ref 135–145)

## 2017-01-09 SURGERY — HYSTERECTOMY, TOTAL, ABDOMINAL, WITH SALPINGECTOMY
Anesthesia: General | Site: Abdomen | Laterality: Bilateral

## 2017-01-09 MED ORDER — MENTHOL 3 MG MT LOZG: 1.0000 | LOZENGE | OROMUCOSAL | Status: DC | PRN

## 2017-01-09 MED ORDER — ROCURONIUM BROMIDE 100 MG/10ML IV SOLN
INTRAVENOUS | Status: AC
  Filled 2017-01-09: qty 1

## 2017-01-09 MED ORDER — DEXAMETHASONE SODIUM PHOSPHATE 4 MG/ML IJ SOLN
INTRAMUSCULAR | Status: AC
  Filled 2017-01-09: qty 1

## 2017-01-09 MED ORDER — ALBUTEROL SULFATE (2.5 MG/3ML) 0.083% IN NEBU: 3.0000 mL | INHALATION_SOLUTION | Freq: Four times a day (QID) | RESPIRATORY_TRACT | Status: DC | PRN

## 2017-01-09 MED ORDER — IBUPROFEN 600 MG PO TABS
600.0000 mg | ORAL_TABLET | Freq: Four times a day (QID) | ORAL | Status: DC | PRN
Start: 2017-01-09 — End: 2017-01-11
  Administered 2017-01-10 – 2017-01-11 (×2): 600 mg via ORAL
  Filled 2017-01-09 (×2): qty 1

## 2017-01-09 MED ORDER — LIDOCAINE HCL (CARDIAC) 20 MG/ML IV SOLN
INTRAVENOUS | Status: DC | PRN
  Administered 2017-01-09: 100 mg via INTRAVENOUS

## 2017-01-09 MED ORDER — FENTANYL CITRATE (PF) 100 MCG/2ML IJ SOLN
INTRAMUSCULAR | Status: DC | PRN
  Administered 2017-01-09: 100 ug via INTRAVENOUS
  Administered 2017-01-09 (×2): 50 ug via INTRAVENOUS

## 2017-01-09 MED ORDER — MIDAZOLAM HCL 2 MG/2ML IJ SOLN
INTRAMUSCULAR | Status: AC
  Filled 2017-01-09: qty 2

## 2017-01-09 MED ORDER — LOSARTAN POTASSIUM 50 MG PO TABS
50.0000 mg | ORAL_TABLET | Freq: Every evening | ORAL | Status: DC
  Administered 2017-01-09 – 2017-01-10 (×2): 50 mg via ORAL
  Filled 2017-01-09 (×2): qty 1

## 2017-01-09 MED ORDER — LIDOCAINE HCL (CARDIAC) 20 MG/ML IV SOLN
INTRAVENOUS | Status: AC
  Filled 2017-01-09: qty 5

## 2017-01-09 MED ORDER — ACETAMINOPHEN 160 MG/5ML PO SOLN
ORAL | Status: AC
  Administered 2017-01-09: 975 mg via ORAL
  Filled 2017-01-09: qty 40.6

## 2017-01-09 MED ORDER — LACTATED RINGERS IV SOLN: INTRAVENOUS | Status: DC

## 2017-01-09 MED ORDER — ACETAMINOPHEN 160 MG/5ML PO SOLN
975.0000 mg | Freq: Once | ORAL | Status: AC
  Administered 2017-01-09: 975 mg via ORAL

## 2017-01-09 MED ORDER — SODIUM CHLORIDE 0.9 % IR SOLN
Status: DC | PRN
  Administered 2017-01-09: 2000 mL

## 2017-01-09 MED ORDER — FENTANYL CITRATE (PF) 100 MCG/2ML IJ SOLN
INTRAMUSCULAR | Status: AC
  Administered 2017-01-09: 25 ug via INTRAVENOUS
  Filled 2017-01-09: qty 2

## 2017-01-09 MED ORDER — ONDANSETRON HCL 4 MG/2ML IJ SOLN
INTRAMUSCULAR | Status: AC
  Filled 2017-01-09: qty 2

## 2017-01-09 MED ORDER — CEFAZOLIN SODIUM-DEXTROSE 2-4 GM/100ML-% IV SOLN
2.0000 g | INTRAVENOUS | Status: AC
  Administered 2017-01-09: 2 g via INTRAVENOUS

## 2017-01-09 MED ORDER — MIDAZOLAM HCL 5 MG/5ML IJ SOLN
INTRAMUSCULAR | Status: DC | PRN
  Administered 2017-01-09: 2 mg via INTRAVENOUS

## 2017-01-09 MED ORDER — ONDANSETRON HCL 4 MG/2ML IJ SOLN
INTRAMUSCULAR | Status: DC | PRN
  Administered 2017-01-09: 4 mg via INTRAVENOUS

## 2017-01-09 MED ORDER — FENTANYL CITRATE (PF) 250 MCG/5ML IJ SOLN
INTRAMUSCULAR | Status: AC
  Filled 2017-01-09: qty 5

## 2017-01-09 MED ORDER — LACTATED RINGERS IV SOLN
INTRAVENOUS | Status: DC
  Administered 2017-01-09 (×3): via INTRAVENOUS

## 2017-01-09 MED ORDER — POTASSIUM CHLORIDE CRYS ER 20 MEQ PO TBCR
20.0000 meq | EXTENDED_RELEASE_TABLET | ORAL | Status: DC
  Administered 2017-01-09: 20 meq via ORAL
  Filled 2017-01-09: qty 1

## 2017-01-09 MED ORDER — ROCURONIUM BROMIDE 100 MG/10ML IV SOLN
INTRAVENOUS | Status: DC | PRN
Start: 2017-01-09 — End: 2017-01-09
  Administered 2017-01-09: 50 mg via INTRAVENOUS

## 2017-01-09 MED ORDER — PANTOPRAZOLE SODIUM 40 MG PO TBEC
40.0000 mg | DELAYED_RELEASE_TABLET | Freq: Every day | ORAL | Status: DC
  Administered 2017-01-10: 40 mg via ORAL
  Filled 2017-01-09: qty 1

## 2017-01-09 MED ORDER — HYDROMORPHONE HCL 1 MG/ML IJ SOLN
1.0000 mg | INTRAMUSCULAR | Status: DC | PRN
  Administered 2017-01-09: 1 mg via INTRAVENOUS
  Filled 2017-01-09: qty 1

## 2017-01-09 MED ORDER — PROPOFOL 10 MG/ML IV BOLUS
INTRAVENOUS | Status: AC
  Filled 2017-01-09: qty 20

## 2017-01-09 MED ORDER — TRAMADOL HCL 50 MG PO TABS
50.0000 mg | ORAL_TABLET | Freq: Four times a day (QID) | ORAL | Status: DC | PRN
  Administered 2017-01-10: 50 mg via ORAL
  Filled 2017-01-09: qty 1

## 2017-01-09 MED ORDER — LACTATED RINGERS IV SOLN
INTRAVENOUS | Status: DC
  Administered 2017-01-09 – 2017-01-10 (×3): via INTRAVENOUS

## 2017-01-09 MED ORDER — MOMETASONE FURO-FORMOTEROL FUM 200-5 MCG/ACT IN AERO
2.0000 | INHALATION_SPRAY | Freq: Two times a day (BID) | RESPIRATORY_TRACT | Status: DC
  Administered 2017-01-10 (×3): 2 via RESPIRATORY_TRACT
  Filled 2017-01-09: qty 8.8

## 2017-01-09 MED ORDER — FENTANYL CITRATE (PF) 100 MCG/2ML IJ SOLN
25.0000 ug | INTRAMUSCULAR | Status: DC | PRN
  Administered 2017-01-09 (×2): 25 ug via INTRAVENOUS

## 2017-01-09 MED ORDER — SUGAMMADEX SODIUM 200 MG/2ML IV SOLN
INTRAVENOUS | Status: AC
  Filled 2017-01-09: qty 2

## 2017-01-09 MED ORDER — SUGAMMADEX SODIUM 200 MG/2ML IV SOLN
INTRAVENOUS | Status: DC | PRN
  Administered 2017-01-09: 170 mg via INTRAVENOUS

## 2017-01-09 MED ORDER — PHENYLEPHRINE HCL 10 MG/ML IJ SOLN
INTRAMUSCULAR | Status: DC | PRN
  Administered 2017-01-09 (×3): 80 ug via INTRAVENOUS

## 2017-01-09 MED ORDER — DEXAMETHASONE SODIUM PHOSPHATE 10 MG/ML IJ SOLN
INTRAMUSCULAR | Status: DC | PRN
  Administered 2017-01-09: 4 mg via INTRAVENOUS

## 2017-01-09 MED ORDER — OXYCODONE-ACETAMINOPHEN 7.5-325 MG PO TABS: 2.0000 | ORAL_TABLET | ORAL | Status: DC | PRN

## 2017-01-09 MED ORDER — PROPOFOL 10 MG/ML IV BOLUS
INTRAVENOUS | Status: DC | PRN
  Administered 2017-01-09: 150 mg via INTRAVENOUS

## 2017-01-09 MED ORDER — PHENYLEPHRINE 40 MCG/ML (10ML) SYRINGE FOR IV PUSH (FOR BLOOD PRESSURE SUPPORT)
PREFILLED_SYRINGE | INTRAVENOUS | Status: AC
  Filled 2017-01-09: qty 10

## 2017-01-09 MED ORDER — KETOROLAC TROMETHAMINE 30 MG/ML IJ SOLN
INTRAMUSCULAR | Status: AC
  Filled 2017-01-09: qty 1

## 2017-01-09 SURGICAL SUPPLY — 43 items
CANISTER SUCT 3000ML (MISCELLANEOUS) ×2 IMPLANT
CLOTH BEACON ORANGE TIMEOUT ST (SAFETY) ×2 IMPLANT
DECANTER SPIKE VIAL GLASS SM (MISCELLANEOUS) IMPLANT
DERMABOND ADVANCED (GAUZE/BANDAGES/DRESSINGS) ×2
DERMABOND ADVANCED .7 DNX12 (GAUZE/BANDAGES/DRESSINGS) ×2 IMPLANT
DRAPE CESAREAN BIRTH W POUCH (DRAPES) ×2 IMPLANT
DRAPE WARM FLUID 44X44 (DRAPE) ×2 IMPLANT
DRSG OPSITE POSTOP 4X10 (GAUZE/BANDAGES/DRESSINGS) ×2 IMPLANT
DURAPREP 26ML APPLICATOR (WOUND CARE) ×2 IMPLANT
ELECT BLADE 6.5 EXT (BLADE) ×2 IMPLANT
GAUZE SPONGE 4X4 16PLY XRAY LF (GAUZE/BANDAGES/DRESSINGS) ×2 IMPLANT
GLOVE BIO SURGEON STRL SZ 6.5 (GLOVE) ×2 IMPLANT
GLOVE BIOGEL PI IND STRL 7.0 (GLOVE) ×4 IMPLANT
GLOVE BIOGEL PI INDICATOR 7.0 (GLOVE) ×4
GOWN STRL REUS W/TWL LRG LVL3 (GOWN DISPOSABLE) ×6 IMPLANT
HEMOSTAT ARISTA ABSORB 3G PWDR (MISCELLANEOUS) ×2 IMPLANT
NEEDLE HYPO 22GX1.5 SAFETY (NEEDLE) ×2 IMPLANT
NS IRRIG 1000ML POUR BTL (IV SOLUTION) ×4 IMPLANT
PACK ABDOMINAL GYN (CUSTOM PROCEDURE TRAY) ×2 IMPLANT
PAD OB MATERNITY 4.3X12.25 (PERSONAL CARE ITEMS) ×2 IMPLANT
PENCIL SMOKE EVAC W/HOLSTER (ELECTROSURGICAL) ×2 IMPLANT
PROTECTOR NERVE ULNAR (MISCELLANEOUS) ×4 IMPLANT
RETRACTOR TRAXI PANNICULUS (MISCELLANEOUS) ×1 IMPLANT
SEPRAFILM MEMBRANE 5X6 (MISCELLANEOUS) IMPLANT
SPONGE LAP 18X18 X RAY DECT (DISPOSABLE) IMPLANT
STAPLER VISISTAT 35W (STAPLE) IMPLANT
SUT PDS AB 0 CT 36 (SUTURE) IMPLANT
SUT PDS AB 0 CTX 60 (SUTURE) IMPLANT
SUT PLAIN 2 0 XLH (SUTURE) ×2 IMPLANT
SUT VIC AB 0 CT1 18XCR BRD8 (SUTURE) ×2 IMPLANT
SUT VIC AB 0 CT1 27 (SUTURE) ×4
SUT VIC AB 0 CT1 27XBRD ANBCTR (SUTURE) ×4 IMPLANT
SUT VIC AB 0 CT1 8-18 (SUTURE) ×2
SUT VIC AB 3-0 PS1 18 (SUTURE)
SUT VIC AB 3-0 PS1 18X BRD (SUTURE) IMPLANT
SUT VIC AB 4-0 KS 27 (SUTURE) ×2 IMPLANT
SUT VICRYL 0 TIES 12 18 (SUTURE) ×2 IMPLANT
SYR CONTROL 10ML LL (SYRINGE) IMPLANT
SYRINGE 10CC LL (SYRINGE) ×2 IMPLANT
TOWEL OR 17X24 6PK STRL BLUE (TOWEL DISPOSABLE) ×4 IMPLANT
TRAXI PANNICULUS RETRACTOR (MISCELLANEOUS) ×1
TRAY FOLEY CATH SILVER 14FR (SET/KITS/TRAYS/PACK) ×2 IMPLANT
WATER STERILE IRR 1000ML POUR (IV SOLUTION) IMPLANT

## 2017-01-09 NOTE — Progress Notes (Signed)
Patient doing well. Pain 3/10.  BP (!) 101/44 (BP Location: Left Arm)   Pulse (!) 59   Temp 97.5 F (36.4 C) (Oral)   Resp 17   SpO2 98%  Results for orders placed or performed during the hospital encounter of 01/09/17 (from the past 24 hour(s))  Basic metabolic panel     Status: Abnormal   Collection Time: 01/09/17 11:37 AM  Result Value Ref Range   Sodium 134 (L) 135 - 145 mmol/L   Potassium 3.3 (L) 3.5 - 5.1 mmol/L   Chloride 103 101 - 111 mmol/L   CO2 26 22 - 32 mmol/L   Glucose, Bld 154 (H) 65 - 99 mg/dL   BUN 13 6 - 20 mg/dL   Creatinine, Ser 0.62 0.44 - 1.00 mg/dL   Calcium 8.0 (L) 8.9 - 10.3 mg/dL   GFR calc non Af Amer >60 >60 mL/min   GFR calc Af Amer >60 >60 mL/min   Anion gap 5 5 - 15   Abdomen is soft and non tender Bandage is clean and dry Urine output good and clear  Discussed surgery with patient Routine care

## 2017-01-09 NOTE — Transfer of Care (Signed)
Immediate Anesthesia Transfer of Care Note  Patient: Shawna Gonzalez  Procedure(s) Performed: Procedure(s): HYSTERECTOMY ABDOMINAL WITH SALPINGOOPHORECTOMY (Bilateral)  Patient Location: PACU  Anesthesia Type:General  Level of Consciousness: awake, alert  and oriented  Airway & Oxygen Therapy: Patient Spontanous Breathing and Patient connected to nasal cannula oxygen  Post-op Assessment: Report given to RN and Post -op Vital signs reviewed and stable  Post vital signs: Reviewed and stable  Last Vitals:  Vitals:   01/09/17 0613  BP: (!) 143/65  Pulse: 82  Resp: 18  Temp: 36.8 C    Last Pain:  Vitals:   01/09/17 0613  TempSrc: Oral      Patients Stated Pain Goal: 4 ([id number])  Complications: No apparent anesthesia complications

## 2017-01-09 NOTE — Progress Notes (Signed)
Patient without complaints.  H and P on the chart  Will proceed with TAH and BSO  Consent on chart

## 2017-01-09 NOTE — Anesthesia Postprocedure Evaluation (Signed)
Anesthesia Post Note  Patient: Shawna Gonzalez  Procedure(s) Performed: Procedure(s) (LRB): HYSTERECTOMY ABDOMINAL WITH SALPINGOOPHORECTOMY (Bilateral)  Patient location during evaluation: PACU Anesthesia Type: General Level of consciousness: sedated Pain management: satisfactory to patient Vital Signs Assessment: post-procedure vital signs reviewed and stable Respiratory status: spontaneous breathing Cardiovascular status: stable Anesthetic complications: no        Last Vitals:  Vitals:   01/09/17 0945 01/09/17 1000  BP: 114/61 116/63  Pulse: 65 62  Resp: (!) 23 (!) 22  Temp:      Last Pain:  Vitals:   01/09/17 0930  TempSrc:   PainSc: 5    Pain Goal: Patients Stated Pain Goal: 4 (01/09/17 [id number])               Riccardo Dubin

## 2017-01-09 NOTE — Anesthesia Procedure Notes (Signed)
Procedure Name: Intubation Date/Time: 01/09/2017 7:24 AM Performed by: Riki Sheer Pre-anesthesia Checklist: Patient identified, Emergency Drugs available, Suction available, Patient being monitored and Timeout performed Patient Re-evaluated:Patient Re-evaluated prior to inductionOxygen Delivery Method: Circle system utilized Preoxygenation: Pre-oxygenation with 100% oxygen Intubation Type: IV induction Ventilation: Mask ventilation without difficulty and Oral airway inserted - appropriate to patient size Laryngoscope Size: Sabra Heck and 2 Grade View: Grade III Tube type: Oral Number of attempts: 1 Airway Equipment and Method: Bougie stylet Placement Confirmation: ETT inserted through vocal cords under direct vision,  positive ETCO2,  CO2 detector and breath sounds checked- equal and bilateral Secured at: 22 cm Tube secured with: Tape Dental Injury: Teeth and Oropharynx as per pre-operative assessment  Difficulty Due To: Difficulty was anticipated, Difficult Airway- due to anterior larynx and Difficult Airway- due to reduced neck mobility

## 2017-01-09 NOTE — Anesthesia Postprocedure Evaluation (Signed)
Anesthesia Post Note  Patient: Shawna Gonzalez  Procedure(s) Performed: Procedure(s) (LRB): HYSTERECTOMY ABDOMINAL WITH SALPINGOOPHORECTOMY (Bilateral)  Patient location during evaluation: [facility] Anesthesia Type: General Level of consciousness: awake, awake and alert, oriented and patient cooperative Pain management: pain level controlled Vital Signs Assessment: post-procedure vital signs reviewed and stable Respiratory status: spontaneous breathing, nonlabored ventilation, respiratory function stable and patient connected to nasal cannula oxygen Cardiovascular status: stable Postop Assessment: no headache and no signs of nausea or vomiting Anesthetic complications: no        Last Vitals:  Vitals:   01/09/17 1052 01/09/17 1156  BP: 112/63 (!) 101/44  Pulse: 65 (!) 59  Resp: 18 17  Temp: 36.7 C 36.4 C    Last Pain:  Vitals:   01/09/17 1200  TempSrc:   PainSc: 6    Pain Goal: Patients Stated Pain Goal: 3 (01/09/17 1100)               Delrico Minehart L

## 2017-01-09 NOTE — Addendum Note (Signed)
Addendum  created 01/09/17 1532 by Raenette Rover, CRNA   Sign clinical note

## 2017-01-09 NOTE — Brief Op Note (Signed)
01/09/2017  8:53 AM  PATIENT:  Shawna Gonzalez  66 y.o. female  PRE-OPERATIVE DIAGNOSIS:  Symptomatic Fibroid  POST-OPERATIVE DIAGNOSIS:  Degenerating fibroid in left broad ligament  PROCEDURE:  Procedure(s): HYSTERECTOMY ABDOMINAL WITH SALPINGOOPHORECTOMY (Bilateral)  Lysis of adhesions  SURGEON:  Surgeon(s) and Role:    * Dian Queen, MD - Primary  PHYSICIAN ASSISTANT:   ASSISTANTS: Dr. Evette Cristal   ANESTHESIA:   general  EBL:  Total I/O In: 2500 [I.V.:2500] Out: 275 [Urine:25; Blood:250]  BLOOD ADMINISTERED:none  DRAINS: Urinary Catheter (Foley)   LOCAL MEDICATIONS USED:  NONE  SPECIMEN:  Source of Specimen:  uterus, cervix, tubes, ovaries and fibroid  DISPOSITION OF SPECIMEN:  PATHOLOGY  COUNTS:  YES  TOURNIQUET:  * No tourniquets in log *  DICTATION: .Other Dictation: Dictation Number [id number]  PLAN OF CARE: Admit to inpatient   PATIENT DISPOSITION:  PACU - hemodynamically stable.   Delay start of Pharmacological VTE agent (>24hrs) due to surgical blood loss or risk of bleeding: not applicable

## 2017-01-10 ENCOUNTER — Encounter: Payer: Self-pay | Admitting: Neurology

## 2017-01-10 ENCOUNTER — Encounter (HOSPITAL_COMMUNITY): Payer: Self-pay

## 2017-01-10 LAB — CBC
HCT: 34.3 % — ABNORMAL LOW (ref 36.0–46.0)
Hemoglobin: 11.4 g/dL — ABNORMAL LOW (ref 12.0–15.0)
MCH: 27.9 pg (ref 26.0–34.0)
MCHC: 33.2 g/dL (ref 30.0–36.0)
MCV: 84.1 fL (ref 78.0–100.0)
Platelets: 187 10*3/uL (ref 150–400)
RBC: 4.08 MIL/uL (ref 3.87–5.11)
RDW: 14 % (ref 11.5–15.5)
WBC: 10.6 10*3/uL — ABNORMAL HIGH (ref 4.0–10.5)

## 2017-01-10 MED ORDER — OXYCODONE HCL 5 MG PO TABS: 5.0000 mg | ORAL_TABLET | ORAL | Status: DC | PRN

## 2017-01-10 MED ORDER — OXYCODONE-ACETAMINOPHEN 5-325 MG PO TABS: 2.0000 | ORAL_TABLET | ORAL | Status: DC | PRN

## 2017-01-10 NOTE — Op Note (Signed)
NAMEMarland Gonzalez  RASHMI, STURDEVANT NO.:  [id number]  MEDICAL RECORD NO.:  [id number]  LOCATION:                                 FACILITY:  PHYSICIAN:  Anniya Whiters L. Helane [name], M.D.    DATE OF BIRTH:  DATE OF PROCEDURE:  01/09/2017 DATE OF DISCHARGE:                              OPERATIVE REPORT   PREOPERATIVE DIAGNOSIS:  Symptomatic fibroid.  POSTOPERATIVE DIAGNOSES:  Degenerating fibroid in the left broad ligament and pelvic adhesions.  PROCEDURE:  Lysis of adhesions, total abdominal hysterectomy, bilateral salpingo-oophorectomy, and removal of left fibroid in the broad ligament.  SURGEON:  Arsh Feutz L. Helane [name], M.D.  ASSISTANT:  Dr. Nori Riis.  ANESTHESIA:  General.  EBL:  250 mL.  PROCEDURE:  The patient was taken to the operating room.  She had been consented about the risk associated with the procedure.  Risk of anesthesia, risk of bleeding, risk of injury to surrounding organs, risk of infection.  She was intubated.  She was then prepped and draped.  A pannus retractor was used to lift her pannus.  A Foley catheter was inserted and she was prepped in a standard fashion.  Time-out was performed.  A sterile drape was applied.  A low transverse incision was made 2 fingerbreadths above the pubic symphysis.  It was carried down to the fascia.  She did have significant subcu approximately 6 cm depth. The fascia scored in the midline and extended laterally.  The rectus muscles were separated in the midline.  The peritoneum was entered bluntly.  The peritoneal incision was then stretched.  The self retaining retractor was then placed in the abdomen and the largest small bowel were placed in the upper abdomen carefully.  Inspection of the pelvis revealed the following her uterus was very small and it was shifted to her right.  Her right tube and ovary were normal.  On the left, we could see a large bulge in the left broad ligament, which was representing the fibroid and the  broad ligament.  It appeared to be situated in the pelvis.  There were some adhesions anteriorly and laterally on this, but they appeared to be filmy.  The ovary appeared to be normal.  We decided to proceed with resection of the broad ligament fibroid first so it did feel like it was completely separate from the uterus.  The round ligament on the left was identified, it was suture ligated.  We then incised that and opened up the broad ligament and we could see to have moderate amount of serous fluid, which was indicative of its degeneration.  We then grasped the fibroid with the clamp and elevated it and then we were able to resect some adhesions away from it with sharp and blunt dissection.  The broad ligament fibroid extended all the way down to the broad ligament down into the cul-de-sac.  We then elevated it and we were able to remove it.  The fibroid was completely separated and it was approximately 10 cm in diameter.  It was set aside and sent to pathology.  At this point, we then proceeded with the remainder of the hysterectomy.  We then identified the left infundibular pelvic ligament, grasped  the ovary, and created avascular window just beneath that and placed a curved Heaney clamp just beneath the ovary with careful attention to avoid the ureter.  The pedicle was secured using a suture ligature of 0 Vicryl suture in a free tie of 0 Vicryl suture.  We then developed the bladder flap anteriorly and then went to the right side of the pelvis where we suture ligated the round ligament in standard fashion with 0 Vicryl.  We opened up the broad ligament anteriorly and posteriorly.  We developed an avascular window just beneath the left tube and ovary and placed a curved Heaney clamp carefully across the infundibulopelvic ligament on the right.  The pedicle was clamped, cut, and suture ligated using 0 Vicryl suture and free tied using 0 Vicryl suture.  We then elevated the uterus  further and placed a curved Heaney clamp at the level of the internal os to clamp the uterine artery staying snug beside the cervix.  Each pedicle was clamped, cut, and suture ligated using 0 Vicryl suture.  There had been some bleeding originally from where the space for the fibroid would have been occupied.  I think there was some general oozing from the surfaces.  Once we did, we mobilized the uterine artery vascular supply that bleeding decreased dramatically.  We then placed a series of straight clamps just beside the uterosacral cardinal ligament, staying snug beside the cervix.  Each pedicle was clamped, cut, and suture ligated using 0 Vicryl suture.  We walked our way carefully down the cervix and then placed curved Heaney clamps just beneath the external os with careful attention to avoid the bladder, which was well away from our clamps and the specimen was removed and identified as ovaries, tubes, uterus, and cervix.  The angle stitches were placed using 0 Vicryl suture and then the vagina was rather small, so we were able to close that with 1 figure-of-eight using 0 Vicryl suture.  Irrigation was performed.  At this point, hemostasis was very good.  We did not see any issue with any problem with bleeding.  Because of the generalized oozing that we had down in the where the fibroid was, I did use Arista on the left pelvic sidewall vaginal cuff and on the left just to help with any postoperative oozing, but I did not see any after we irrigated.  We then removed all laparotomy pads and instruments from the abdominal cavity. The peritoneum was closed using 0 Vicryl and running stitch.  The fascia was closed using 0 Vicryl starting each corner meeting in the midline and the subcu was closed with plain gut interrupted.  The skin was closed with 3-0 Vicryl on a Keith needle and Dermabond was applied.  All sponge, lap, and instrument counts were correct x2.  Of note, after  the procedure, there was a small amount of blood tinged urine noted and so we will give her a bolus.  I do think because of the extensive dissection of the fibroid around her bladder and area, this may have contributed to this.  We would continue to monitor that postoperatively.     Janaysha Depaulo L. Helane [name], M.D.     Nevin Bloodgood  D:  01/09/2017  T:  01/10/2017  Job:  [id number]

## 2017-01-10 NOTE — Progress Notes (Signed)
Patient doing well.  BP (!) 107/50 (BP Location: Left Arm)   Pulse 80   Temp 98.7 F (37.1 C) (Oral)   Resp 20   Ht 4\' 10"  (1.473 m)   Wt 85.3 kg (188 lb 0.4 oz)   SpO2 98%   BMI 39.30 kg/m  Results for orders placed or performed during the hospital encounter of 01/09/17 (from the past 24 hour(s))  Basic metabolic panel     Status: Abnormal   Collection Time: 01/09/17 11:37 AM  Result Value Ref Range   Sodium 134 (L) 135 - 145 mmol/L   Potassium 3.3 (L) 3.5 - 5.1 mmol/L   Chloride 103 101 - 111 mmol/L   CO2 26 22 - 32 mmol/L   Glucose, Bld 154 (H) 65 - 99 mg/dL   BUN 13 6 - 20 mg/dL   Creatinine, Ser 0.62 0.44 - 1.00 mg/dL   Calcium 8.0 (L) 8.9 - 10.3 mg/dL   GFR calc non Af Amer >60 >60 mL/min   GFR calc Af Amer >60 >60 mL/min   Anion gap 5 5 - 15  CBC     Status: Abnormal   Collection Time: 01/10/17  5:14 AM  Result Value Ref Range   WBC 10.6 (H) 4.0 - 10.5 K/uL   RBC 4.08 3.87 - 5.11 MIL/uL   Hemoglobin 11.4 (L) 12.0 - 15.0 g/dL   HCT 34.3 (L) 36.0 - 46.0 %   MCV 84.1 78.0 - 100.0 fL   MCH 27.9 26.0 - 34.0 pg   MCHC 33.2 30.0 - 36.0 g/dL   RDW 14.0 11.5 - 15.5 %   Platelets 187 150 - 400 K/uL   Abdomen is soft and non tender Bandage clean and dry  IMPRESSION: POD #1  Doing well  PLAN: Ambulate Remove Foley  Advance Diet Discharge home tomorrow Ibuprofen and tramadol prn

## 2017-01-11 MED ORDER — TRAMADOL HCL 50 MG PO TABS: 50.0000 mg | ORAL_TABLET | Freq: Four times a day (QID) | ORAL | 0 refills | Status: AC | PRN

## 2017-01-11 MED ORDER — IBUPROFEN 600 MG PO TABS: 600.0000 mg | ORAL_TABLET | Freq: Four times a day (QID) | ORAL | 0 refills | Status: DC | PRN

## 2017-01-11 NOTE — Discharge Summary (Signed)
Admission Diagnosis:  Fibroid  Discharge Diagnosis:  Same  Hospital Course: 66 year old female with fibroid. Underwent TAH and BSO. She is doing very well. By POD #2 she was ambulating, voiding, tolerating regular diet and had good pain control.  BP (!) 104/55 (BP Location: Left Arm)   Pulse 62   Temp 98.4 F (36.9 C) (Oral)   Resp 18   Ht 4\' 10"  (1.473 m)   Wt 85.3 kg (188 lb 0.4 oz)   SpO2 96%   BMI 39.30 kg/m  No results found for this or any previous visit (from the past 24 hour(s)). Abdomen is soft and non tender Incision clean and dry and intact  No results found for this or any previous visit (from the past 24 hour(s)). Patient will be discharged home with Tramadol and Ibuprofen  Follow up in 1 week

## 2017-01-11 NOTE — Progress Notes (Signed)
Pt is discharged in the care of husband with N.T. Escort. Denies any pain or discomfort. Spirits are good . Pt is discharged per ambulatory . Stable.Understands all discharged instructions well. Questions asked and answered.  Stable.

## 2017-01-18 ENCOUNTER — Ambulatory Visit (INDEPENDENT_AMBULATORY_CARE_PROVIDER_SITE_OTHER): Payer: 59 | Admitting: Neurology

## 2017-01-18 ENCOUNTER — Encounter: Payer: Self-pay | Admitting: Neurology

## 2017-01-18 VITALS — BP 142/80 | HR 84 | Resp 20 | Ht <= 58 in | Wt 177.0 lb

## 2017-01-18 DIAGNOSIS — R0902 Hypoxemia: Secondary | ICD-10-CM

## 2017-01-18 DIAGNOSIS — G4733 Obstructive sleep apnea (adult) (pediatric): Secondary | ICD-10-CM | POA: Diagnosis not present

## 2017-01-18 DIAGNOSIS — J45909 Unspecified asthma, uncomplicated: Secondary | ICD-10-CM | POA: Diagnosis not present

## 2017-01-18 DIAGNOSIS — Z9989 Dependence on other enabling machines and devices: Secondary | ICD-10-CM | POA: Diagnosis not present

## 2017-01-18 DIAGNOSIS — E662 Morbid (severe) obesity with alveolar hypoventilation: Secondary | ICD-10-CM

## 2017-01-18 NOTE — Patient Instructions (Signed)

## 2017-01-18 NOTE — Progress Notes (Signed)
SLEEP MED[facility]INE CLINIC   Provider:  Larey Seat, M D  Referring Provider: Crist Infante, MD Primary Care Physician:  Jerlyn Ly, MD  Chief Complaint  Patient presents with  . Follow-up    cpap    HPI:  Shawna Gonzalez is a 66 y.o. female , seen here as a referral  from Dr. Joylene Draft for sleep hypoxemia, question if sleep apnea is presented.   Chief complaint according to patient : I had a CPAP in the past but it didn't work well for me "I'm using oxygen at night but I have to wake up 4-5 times to go to the bathroom interrupting my sleep."  Sleep habits are as follows: Shawna Gonzalez reports that she often falls asleep in a chair in the living room rather than in her own bed, and that she feels drowsy or excessively daytime sleepy several times a day. She also reports a very fragmented sleep wake pattern. She often wakes up from sleep because of nocturia and her bathroom breaks continue in daytime. She may go to sleep inadvertently in the recliner at about 8 or 9 PM and then transferred hour later to the bedroom proper. She may there fall asleep for an hour to 2 hours , her sleep is fragmented and these little portions. Sometimes she is too tired to sleepy to transfer from the recliner to the bedroom when she will remain all night in the recliner. In the morning she tries to rise at about 6:30 AM she prepares breakfast for family, and by 7:15 she has to leave for her work. 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 She does not have a shift work history. Shawna Gonzalez's medical history is as follows. She has some morbid obesity, diabetes mellitus type 2, osteoporosis, sleep related hypoxemia with partially treated obstructive sleep apnea or untreated obstructive sleep apnea. 7 years ago she was diagnosed with a melanoma. She had a vitamin D deficiency, allergic rhinitis, and congenital nystagmus. Her family history is positive for arthritis, diabetes,  hypertension stroke and CVA. Sleep medical history and family sleep history: she was diagnosed with OSA in 2009, but could not tolerate CPAP. Social history:  The patient lives with her mother she is single and has no children, she does not drink alcohol she does not smoke she does not use recreational drugs.   01-18-16 Shawna Gonzalez was just last week treated with an IV infusion for a severe asthma attack. She has asthma related sleep hypoxemia she has obstructive sleep apnea and underwent CPAP titration on 10/18/2015. She has continued to use on CPAP between 10 and 15 cm water with 37 m EPR and was a prolonged ramp time. Her download data obtained in the office look excellent. 97% compliance 6 hours and 5 minutes of daily use and a residual AHI of only 1.5. She does not have oxygen at home. She is using multiple medications and rescue inhalers for her underlying condition of asthma and hypoventilation. Her asthma exacerbated with allergies.  She is often sleeping in her recliner watching TV, and goes from there to the bed, continuing to sleep.   Interval history from the first of [place] 2018 has the pleasure of seeing Shawna Gonzalez was medical history includes now a recent hysterectomy. She has been more sleep deprived over the last 10 days or so and during her hospitalization could not bring her CPAP with her. Her compliance is accordingly reduced the hospital did not provide  a CPAP. Her average user time is now reduced to 3 hours and 6 minutes but related to medical reasons. She is using an AutoSet between 10 and 15 cm water with 3 cm EPR and her 95th percentile pressure is at 12.9 cm, her residual apnea index is 1.4 which is a great resolution. There are no major air leaks noted. Her Epworth sleepiness score is reduced to 4 points from 13 prior to CPAP her fatigue severity is 9 points only and her depression score is endorsed at 2 out of 15 possible points.  Review of Systems: Out of a complete 14  system review, the patient complains of only the following symptoms, and all other reviewed systems are negative. Weight gain, nocturia , urinary frequency on lasix.  Epworth score 4 from 13 pre CPAP , Fatigue severity score  9 from 45 , geriatric depression score 2 points.    Social History   Social History  . Marital status: Single    Spouse name: N/A  . Number of children: 0  . Years of education: 23   Occupational History  . customer service [employer]   Social History Main Topics  . Smoking status: Never Smoker  . Smokeless tobacco: Never Used  . Alcohol use No  . Drug use: No  . Sexual activity: Not on file   Other Topics Concern  . Not on file   Social History Narrative   Lives at home with mother.   Caffeine use: Diet and caffeine free soda (8 drinks per day)         Epworth Sleepiness Scale = 7 (as of 01/26/2016)    Family History  Problem Relation Age of Onset  . Colon polyps Mother   . Heart disease Mother   . Colon polyps Father   . Colon polyps Brother     Past Medical History:  Diagnosis Date  . Adenomatous colon polyp   . Anemia    history of  . Arthritis   . Asthma   . Atypical chest pain   . Bacterial ear infection 2018  . Cancer ([place])    melanoma skin cancer 7 years ago  . Diabetes mellitus without complication ([place])    history of taking diabetic medication, has lost weight no longer needing medciation at this time  . GERD (gastroesophageal reflux disease)   . Glaucoma   . Heart murmur    slight  . Hypertension   . Macular degeneration   . Osteoporosis   . Sleep apnea   . Ulcerative colitis, chronic ([place])     Past Surgical History:  Procedure Laterality Date  . CARPAL TUNNEL RELEASE  2010   right  . CATARACT EXTRACTION W/ INTRAOCULAR LENS  IMPLANT, BILATERAL  2008  . CHOLECYSTECTOMY  2000  . COLONOSCOPY    . DE QUERVAIN'S RELEASE  2008   right wrist  . HYSTERECTOMY ABDOMINAL WITH SALPINGECTOMY Bilateral 01/09/2017   Procedure:  HYSTERECTOMY ABDOMINAL WITH SALPINGOOPHORECTOMY;  Surgeon: Dian Queen, MD;  Location: [facility];  Service: Gynecology;  Laterality: Bilateral;  . TONSILLECTOMY    . UPPER GI ENDOSCOPY      Current Outpatient Prescriptions  Medication Sig Dispense Refill  . albuterol (PROVENTIL HFA;VENTOLIN HFA) 108 (90 BASE) MCG/ACT inhaler Inhale 2 puffs into the lungs every 6 (six) hours as needed for wheezing or shortness of breath.    . budesonide-formoterol (SYMBICORT) 160-4.5 MCG/ACT inhaler Inhale 2 puffs into the lungs daily.    . dorzolamide (TRUSOPT) 2 %  ophthalmic solution Place 1 drop into both eyes 3 (three) times daily. (Patient taking differently: Place 1 drop into both eyes 2 (two) times daily. ) 10 mL 12  . esomeprazole (NEXIUM) 40 MG capsule Take 40 mg by mouth every evening.     . fexofenadine (ALLEGRA) 180 MG tablet Take 180 mg by mouth every evening.     . furosemide (LASIX) 40 MG tablet Take 40 mg by mouth every evening.   4  . ibuprofen (ADVIL,MOTRIN) 600 MG tablet Take 1 tablet (600 mg total) by mouth every 6 (six) hours as needed (mild pain). 60 tablet 0  . losartan (COZAAR) 50 MG tablet Take 50 mg by mouth every evening.     . Multiple Vitamins-Minerals (MULTIVITAMIN GUMMIES ADULT) CHEW Chew 2 tablets by mouth every evening.    . naproxen sodium (ANAPROX) 220 MG tablet Take 220 mg by mouth 2 (two) times daily as needed (for pain.).    Marland Kitchen potassium chloride SA (K-DUR,KLOR-CON) 20 MEQ tablet Take 20 mEq by mouth every Tuesday, Thursday, and Saturday at 6 PM.     . traMADol (ULTRAM) 50 MG tablet Take 1 tablet (50 mg total) by mouth every 6 (six) hours as needed for moderate pain. 30 tablet 0  . Travoprost, BAK Free, (TRAVATAN Z) 0.004 % SOLN ophthalmic solution PLACE 1 DROP INTO BOTH EYES NIGHTLY    . Vitamin D, Ergocalciferol, (DRISDOL) 50000 UNITS CAPS Take 50,000 Units by mouth 2 (two) times a week. Sunday & Thursday     No current facility-administered medications for this visit.      Allergies as of 01/18/2017 - Review Complete 01/18/2017  Allergen Reaction Noted  . Amlodipine Cough 08/03/2015  . Bydureon [exenatide] Itching and Other (See Comments) 08/03/2015  . Lisinopril Cough 08/03/2015  . Lunesta [eszopiclone] Other (See Comments) 08/03/2015  . Sulfa antibiotics Rash 01/09/2012    Vitals: BP (!) 142/80   Pulse 84   Resp 20   Ht 4\' 10"  (1.473 m)   Wt 177 lb (80.3 kg)   BMI 36.99 kg/m  Last Weight:  Wt Readings from Last 1 Encounters:  01/18/17 177 lb (80.3 kg)   [id number] mass index is 36.99 kg/m.     Last Height:   Ht Readings from Last 1 Encounters:  01/18/17 4\' 10"  (1.473 m)    Physical exam:  General: The patient is awake, alert and appears not in acute distress. The patient is well groomed. Head: Normocephalic, atraumatic. Neck is supple. Mallampati 4 ,  neck circumference: 18.5 . Nasal airflow restricted, TMJ is not  evident . Retrognathia is seen.  Cardiovascular:  Regular rate and rhythm , without  murmurs or carotid bruit, and without distended neck veins. Respiratory: Lungs are clear to auscultation. Skin:  Without evidence of edema, or rash Trunk: BMI is down to 37 from 50 at the time of PSG. The patient's abdomen and torso is most affected   Neurologic exam : The patient is awake and alert, oriented to place and time.   Speech is fluent,  without  dysarthria, dysphonia or aphasia.  She is not longer short of breath,  Mood and affect are dysthymic , she is sleepy here, drowsy.  Cranial nerves: Pupils are equal and briskly reactive to light. Primary nystagmus is still present.  Visual fields by finger perimetry are intact.  Facial motor strength is symmetric and tongue and uvula move midline. Shoulder shrug was symmetrical.    The patient was advised of the nature of  the diagnosed sleep disorder , the treatment options and risks for general a health and wellness arising from not treating the condition.  I spent more than 15   minutes of face to face time with greater than 50% of time  spent in counseling and coordination of care.  We have discussed the diagnosis and differential and I answered the patient's questions.  Mrs. Speese has undergone several pulse oximetries at home with and without oxygen but I don't have the original records here to review. She was hospitalized for her 51th birthday/ hysterectomy.  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   Assessment:  After physical and neurologic examination, review of laboratory studies,  Personal review of imaging studies, reports of other /same  Imaging studies ,  Results of polysomnography/ neurophysiology testing and pre-existing records as far as provided in visit., my assessment is    1) Asthma and OSA overlapping syndrome. She also had signs of obesity hypoventilation, has done very well with CPAP which has also made oxygen supplementation obsolete.  Reported chronic sleep related hypoxemia is treated by CPAP, in an  asthmatic and morbidly obese patient.   Her obesity is truncal affecting her diaphragmatic strength and her chest wall movements.   The patient still has obstructive sleep apnea . She has lost a significant amount of weight and is no longer considered super obese but in the grade 2 obesity category under 40 BMI. She just recently underwent a hysterectomy and has been very sleep deprived.  Plan:  Treatment plan and additional workup :  I like Mrs. Gadea to start compliantly using CPAP again- which means nightly for the minimum of 4 hours.   RV with NP.    Asencion Partridge Yves Fodor MD  01/18/2017   CC: Crist Infante, [address] [place], [place] [address]

## 2017-02-20 ENCOUNTER — Telehealth: Payer: Self-pay | Admitting: Emergency Medicine

## 2017-02-20 NOTE — Telephone Encounter (Signed)
Received fax from Dr. Christen Butter office to schedule patient for a follow-up appointment after surgery. Left message to call office.

## 2017-02-23 NOTE — Telephone Encounter (Signed)
Patient states she has still been having nausea and does not feel better and would like to be seen. Appointment made for next week.

## 2017-02-28 ENCOUNTER — Encounter: Payer: Self-pay | Admitting: Gastroenterology

## 2017-02-28 ENCOUNTER — Ambulatory Visit (INDEPENDENT_AMBULATORY_CARE_PROVIDER_SITE_OTHER): Payer: 59 | Admitting: Gastroenterology

## 2017-02-28 VITALS — BP 122/70 | HR 92 | Ht <= 58 in | Wt 184.4 lb

## 2017-02-28 DIAGNOSIS — R1013 Epigastric pain: Secondary | ICD-10-CM | POA: Diagnosis not present

## 2017-02-28 DIAGNOSIS — R110 Nausea: Secondary | ICD-10-CM | POA: Diagnosis not present

## 2017-02-28 NOTE — Patient Instructions (Signed)

## 2017-02-28 NOTE — Progress Notes (Signed)
02/28/2017 Shawna Gonzalez [id number] 1951-06-22   HISTORY OF PRESENT ILLNESS:  This is a 66 year old female who is going to follow with Dr. Silverio Decamp.  I saw her in October 2017 for complaints of diffuse abdominal discomfort, nausea, bloating. CT scan of abdomen and pelvis was performed and showed hepatic steatosis, diverticulosis, and an adnexal mass. She was seen by Dr. Helane [name] and underwent a hysterectomy in January. Pathology was benign. She returns to our office today stating that she continues to have nausea and upper abdominal pain/burning. She is on Nexium 40 mg daily and that has been increased to twice a day as well as Zantac 150 mg added initially once, then twice a day over the past several months to one year by her PCP without improvement.  Her last colonoscopy was in February 2013 at which time she is found have 2 polyps that were removed and were hyperplastic on pathology. Also found to have diverticulosis. Was put in for 10 year colonoscopy recall. Her last EGD was in May 2008 which time she is found have a hiatal hernia. Small bowel biopsies and esophageal biopsies were normal. Gastric biopsy showed benign gastric-type mucosa with mild chronic gastritis.   Past Medical History:  Diagnosis Date  . Adenomatous colon polyp   . Anemia    history of  . Arthritis   . Asthma   . Atypical chest pain   . Bacterial ear infection 2018  . Cancer ([place])    melanoma skin cancer 7 years ago  . Diabetes mellitus without complication ([place])    history of taking diabetic medication, has lost weight no longer needing medciation at this time  . GERD (gastroesophageal reflux disease)   . Glaucoma   . Heart murmur    slight  . Hypertension   . Macular degeneration   . Osteoporosis   . Sleep apnea   . Ulcerative colitis, chronic ([place])    Past Surgical History:  Procedure Laterality Date  . CARPAL TUNNEL RELEASE  2010   right  . CATARACT EXTRACTION W/ INTRAOCULAR LENS  IMPLANT,  BILATERAL  2008  . CHOLECYSTECTOMY  2000  . COLONOSCOPY    . DE QUERVAIN'S RELEASE  2008   right wrist  . HYSTERECTOMY ABDOMINAL WITH SALPINGECTOMY Bilateral 01/09/2017   Procedure: HYSTERECTOMY ABDOMINAL WITH SALPINGOOPHORECTOMY;  Surgeon: Dian Queen, MD;  Location: [facility];  Service: Gynecology;  Laterality: Bilateral;  . TONSILLECTOMY    . UPPER GI ENDOSCOPY      reports that she has never smoked. She has never used smokeless tobacco. She reports that she does not drink alcohol or use drugs. family history includes Colon polyps in her brother, father, and mother; Heart disease in her mother. Allergies  Allergen Reactions  . Amlodipine Cough  . Bydureon [Exenatide] Itching and Other (See Comments)    KNOTS IN SKIN  . Lisinopril Cough  . Lunesta [Eszopiclone] Other (See Comments)    Metallic taste in mouth  . Sulfa Antibiotics Rash      Outpatient Encounter Prescriptions as of 02/28/2017  Medication Sig  . albuterol (PROVENTIL HFA;VENTOLIN HFA) 108 (90 BASE) MCG/ACT inhaler Inhale 2 puffs into the lungs every 6 (six) hours as needed for wheezing or shortness of breath.  . budesonide-formoterol (SYMBICORT) 160-4.5 MCG/ACT inhaler Inhale 2 puffs into the lungs daily.  . dorzolamide (TRUSOPT) 2 % ophthalmic solution Place 1 drop into both eyes 3 (three) times daily. (Patient taking differently: Place 1 drop into both eyes  2 (two) times daily. )  . esomeprazole (NEXIUM) 40 MG capsule Take 40 mg by mouth every evening.   . fexofenadine (ALLEGRA) 180 MG tablet Take 180 mg by mouth every evening.   . furosemide (LASIX) 40 MG tablet Take 40 mg by mouth every evening.   Marland Kitchen ibuprofen (ADVIL,MOTRIN) 600 MG tablet Take 1 tablet (600 mg total) by mouth every 6 (six) hours as needed (mild pain).  Marland Kitchen losartan (COZAAR) 50 MG tablet Take 50 mg by mouth every evening.   . Multiple Vitamins-Minerals (MULTIVITAMIN GUMMIES ADULT) CHEW Chew 2 tablets by mouth every evening.  . naproxen sodium (ANAPROX)  220 MG tablet Take 220 mg by mouth 2 (two) times daily as needed (for pain.).  Marland Kitchen potassium chloride SA (K-DUR,KLOR-CON) 20 MEQ tablet Take 20 mEq by mouth every Tuesday, Thursday, and Saturday at 6 PM.   . traMADol (ULTRAM) 50 MG tablet Take 1 tablet (50 mg total) by mouth every 6 (six) hours as needed for moderate pain.  . Travoprost, BAK Free, (TRAVATAN Z) 0.004 % SOLN ophthalmic solution PLACE 1 DROP INTO BOTH EYES NIGHTLY  . Vitamin D, Ergocalciferol, (DRISDOL) 50000 UNITS CAPS Take 50,000 Units by mouth 2 (two) times a week. Sunday & Thursday   No facility-administered encounter medications on file as of 02/28/2017.      REVIEW OF SYSTEMS  : All other systems reviewed and negative except where noted in the History of Present Illness.   PHYSICAL EXAM: BP 122/70 (BP Location: Left Arm, Patient Position: Sitting, Cuff Size: Normal)   Pulse 92   Ht 4' 9.25" (1.454 m) Comment: height measured without shoes  Wt 184 lb 6 oz (83.6 kg)   BMI 39.55 kg/m  General: Well developed white female in no acute distress Head: Normocephalic and atraumatic Eyes:  Sclerae anicteric, conjunctiva pink. Ears: Normal auditory acuity Lungs: Clear throughout to auscultation Heart: Regular rate and rhythm Abdomen: Soft, non-distended. Normal bowel sounds.  Mild epigastric TTP. Musculoskeletal: Symmetrical with no gross deformities  Skin: No lesions on visible extremities Extremities: No edema  Neurological: Alert oriented x 4, grossly non-focal Psychological:  Alert and cooperative. Normal mood and affect  ASSESSMENT AND PLAN: -Epigastric burning abdominal pain and nausea:  ? Functional dyspepsia.  CT scan negative for cause of her symptoms, and symptoms have been persistent despite increases and changes in her acid medications. We'll schedule for EGD with Dr. Silverio Decamp to evaluate.  The risks, benefits, and alternatives to EGD were discussed with the patient and she consents to proceed.    CC:  Crist Infante, MD

## 2017-03-02 NOTE — Progress Notes (Signed)
Reviewed and agree with documentation and assessment and plan. K. Veena Nandigam , MD   

## 2017-03-05 ENCOUNTER — Encounter: Payer: Self-pay | Admitting: Gastroenterology

## 2017-03-05 ENCOUNTER — Ambulatory Visit (AMBULATORY_SURGERY_CENTER): Payer: 59 | Admitting: Gastroenterology

## 2017-03-05 VITALS — BP 122/68 | HR 74 | Temp 98.2°F | Resp 18 | Ht <= 58 in | Wt 186.0 lb

## 2017-03-05 DIAGNOSIS — K2990 Gastroduodenitis, unspecified, without bleeding: Secondary | ICD-10-CM

## 2017-03-05 DIAGNOSIS — K2970 Gastritis, unspecified, without bleeding: Secondary | ICD-10-CM | POA: Diagnosis not present

## 2017-03-05 DIAGNOSIS — R1013 Epigastric pain: Secondary | ICD-10-CM | POA: Diagnosis present

## 2017-03-05 HISTORY — PX: UPPER GI ENDOSCOPY: SHX6162

## 2017-03-05 MED ORDER — SODIUM CHLORIDE 0.9 % IV SOLN: 500.0000 mL | INTRAVENOUS | Status: DC

## 2017-03-05 NOTE — Progress Notes (Signed)
No changes since the office visit.

## 2017-03-05 NOTE — Op Note (Signed)
[facility] Patient Name: Shawna Gonzalez Procedure Date: 03/05/2017 3:52 PM MRN: [id number] Endoscopist: Mauri Pole , MD Age: 66 Referring MD:  Date of Birth: 10/27/1951 Gender: Female Account #: [id number] Procedure:                Upper GI endoscopy Indications:              Epigastric abdominal pain Medicines:                Monitored Anesthesia Care Procedure:                Pre-Anesthesia Assessment:                           - Prior to the procedure, a History and Physical                            was performed, and patient medications and                            allergies were reviewed. The patient's tolerance of                            previous anesthesia was also reviewed. The risks                            and benefits of the procedure and the sedation                            options and risks were discussed with the patient.                            All questions were answered, and informed consent                            was obtained. Prior Anticoagulants: The patient has                            taken no previous anticoagulant or antiplatelet                            agents. ASA Grade Assessment: II - A patient with                            mild systemic disease. After reviewing the risks                            and benefits, the patient was deemed in                            satisfactory condition to undergo the procedure.                           After obtaining informed consent, the endoscope was  passed under direct vision. Throughout the                            procedure, the patient's blood pressure, pulse, and                            oxygen saturations were monitored continuously. The                            Model GIF-[id number]) scope was introduced                            through the mouth, and advanced to the second part                            of duodenum. The  upper GI endoscopy was                            accomplished without difficulty. The patient                            tolerated the procedure well. Scope In: Scope Out: Findings:                 LA Grade B (one or more mucosal breaks greater than                            5 mm, not extending between the tops of two mucosal                            folds) esophagitis with no bleeding was found 34 to                            35 cm from the incisors.                           Scattered mild inflammation characterized by                            congestion (edema), erosions and erythema was found                            in the gastric antrum. Biopsies were taken with a                            cold forceps for histology. Biopsies were taken                            with a cold forceps for histology.                           The examined duodenum was normal. Complications:            No immediate complications. Estimated Blood Loss:     Estimated blood loss  was minimal. Impression:               - LA Grade B esophagitis.                           - Gastritis. Biopsied.                           - Normal examined duodenum. Recommendation:           - Resume previous diet.                           - Continue present medications.                           - Await pathology results.                           - No aspirin, ibuprofen, naproxen, or other                            non-steroidal anti-inflammatory drugs for 2 weeks.                           - No repeat upper endoscopy.                           - Return to GI office PRN. Mauri Pole, MD 03/05/2017 4:17:07 PM This report has been signed electronically.

## 2017-03-05 NOTE — Progress Notes (Signed)
Dental advisory given to patient 

## 2017-03-05 NOTE — Progress Notes (Signed)
Airway supported with chin lift at angle of the mandible during procedure  A/ox3 pleased with MAC, report to [employer]

## 2017-03-05 NOTE — Patient Instructions (Signed)
YOU HAD AN ENDOSCOPIC PROCEDURE TODAY AT [facility]:   Refer to the procedure report that was given to you for any specific questions about what was found during the examination.  If the procedure report does not answer your questions, please call your gastroenterologist to clarify.  If you requested that your care partner not be given the details of your procedure findings, then the procedure report has been included in a sealed envelope for you to review at your convenience later.  YOU SHOULD EXPECT: Some feelings of bloating in the abdomen. Passage of more gas than usual.  Walking can help get rid of the air that was put into your GI tract during the procedure and reduce the bloating.   Please Note:  You might notice some irritation and congestion in your nose or some drainage.  This is from the oxygen used during your procedure.  There is no need for concern and it should clear up in a day or so.  SYMPTOMS TO REPORT IMMEDIATELY: Following upper endoscopy (EGD)  Vomiting of blood or coffee ground material  New chest pain or pain under the shoulder blades  Painful or persistently difficult swallowing  New shortness of breath  Fever of 100F or higher  Black, tarry-looking stools  For urgent or emergent issues, a gastroenterologist can be reached at any hour by calling [phone].   DIET:  We do recommend a small meal at first, but then you may proceed to your regular diet.  Drink plenty of fluids but you should avoid alcoholic beverages for 24 hours.  ACTIVITY:  You should plan to take it easy for the rest of today and you should NOT DRIVE or use heavy machinery until tomorrow (because of the sedation medicines used during the test).    FOLLOW UP: Our staff will call the number listed on your records the next business day following your procedure to check on you and address any questions or concerns that you may have regarding the information given to you following your  procedure. If we do not reach you, we will leave a message.  However, if you are feeling well and you are not experiencing any problems, there is no need to return our call.  We will assume that you have returned to your regular daily activities without incident.  If any biopsies were taken you will be contacted by phone or by letter within the next 1-3 weeks.  Please call us at [phone] if you have not heard about the biopsies in 3 weeks.   SIGNATURES/CONFIDENTIALITY: You and/or your care partner have signed paperwork which will be entered into your electronic medical record.  These signatures attest to the fact that that the information above on your After Visit Summary has been reviewed and is understood.  Full responsibility of the confidentiality of this discharge information lies with you and/or your care-partner.  Await pathology  Please read over handout about gastritis and esophagitis  Please do not take Aspirin, Aspirin containing products (BC or Goody powders), Naproxen, or NSAIDS (Ibuprofen, Advil, Aleve. Motrin) for 2 weeks.  Tylenol is ok  Continue your normal medications

## 2017-03-05 NOTE — Progress Notes (Signed)
Called to room to assist during endoscopic procedure.  Patient ID and intended procedure confirmed with present staff. Received instructions for my participation in the procedure from the performing physician.  

## 2017-03-06 ENCOUNTER — Telehealth: Payer: Self-pay

## 2017-03-06 ENCOUNTER — Telehealth: Payer: Self-pay | Admitting: *Deleted

## 2017-03-06 NOTE — Telephone Encounter (Signed)
Left message on answering machine. 

## 2017-03-06 NOTE — Telephone Encounter (Signed)
  Follow up Call-  Call back number 03/05/2017  Post procedure Call Back phone  # [phone]  Permission to leave phone message Yes  Some recent data might be hidden     Patient questions:  Do you have a fever, pain , or abdominal swelling? No. Pain Score  0 *  Have you tolerated food without any problems? Yes.    Have you been able to return to your normal activities? Yes.    Do you have any questions about your discharge instructions: Diet   No. Medications  No. Follow up visit  No.  Do you have questions or concerns about your Care? No.  Actions: * If pain score is 4 or above: No action needed, pain <4.

## 2017-03-09 ENCOUNTER — Other Ambulatory Visit: Payer: Self-pay

## 2017-03-09 DIAGNOSIS — A048 Other specified bacterial intestinal infections: Secondary | ICD-10-CM

## 2017-03-09 MED ORDER — OMEPRAZOLE 20 MG PO CPDR: 20.0000 mg | DELAYED_RELEASE_CAPSULE | Freq: Every day | ORAL | 0 refills | Status: DC

## 2017-03-09 MED ORDER — BIS SUBCIT-METRONID-TETRACYC 140-125-125 MG PO CAPS: 3.0000 | ORAL_CAPSULE | Freq: Three times a day (TID) | ORAL | 0 refills | Status: DC

## 2017-03-28 ENCOUNTER — Telehealth: Payer: Self-pay | Admitting: Gastroenterology

## 2017-03-28 NOTE — Telephone Encounter (Signed)
Spoke with the patient. Reviewed about off the PPI 4 to 6 weeks and then retest for H Pylori. Discussed the timing of the testing and her scheduled follow appointment.

## 2017-04-05 DIAGNOSIS — H35052 Retinal neovascularization, unspecified, left eye: Secondary | ICD-10-CM | POA: Diagnosis not present

## 2017-04-05 DIAGNOSIS — Q159 Congenital malformation of eye, unspecified: Secondary | ICD-10-CM | POA: Diagnosis not present

## 2017-04-05 DIAGNOSIS — H35352 Cystoid macular degeneration, left eye: Secondary | ICD-10-CM | POA: Diagnosis not present

## 2017-04-05 DIAGNOSIS — H40023 Open angle with borderline findings, high risk, bilateral: Secondary | ICD-10-CM | POA: Diagnosis not present

## 2017-04-05 DIAGNOSIS — H353221 Exudative age-related macular degeneration, left eye, with active choroidal neovascularization: Secondary | ICD-10-CM | POA: Diagnosis not present

## 2017-04-23 ENCOUNTER — Other Ambulatory Visit: Payer: 59

## 2017-04-23 ENCOUNTER — Other Ambulatory Visit: Payer: Self-pay

## 2017-04-23 DIAGNOSIS — A048 Other specified bacterial intestinal infections: Secondary | ICD-10-CM

## 2017-04-23 DIAGNOSIS — A498 Other bacterial infections of unspecified site: Secondary | ICD-10-CM

## 2017-04-24 LAB — HELICOBACTER PYLORI  SPECIAL ANTIGEN: H. PYLORI Antigen: NOT DETECTED

## 2017-04-24 LAB — CLOSTRIDIUM DIFFICILE BY PCR: Toxigenic C. Difficile by PCR: NOT DETECTED

## 2017-05-02 ENCOUNTER — Other Ambulatory Visit: Payer: Self-pay | Admitting: Orthopedic Surgery

## 2017-05-07 ENCOUNTER — Ambulatory Visit (INDEPENDENT_AMBULATORY_CARE_PROVIDER_SITE_OTHER): Payer: 59 | Admitting: Gastroenterology

## 2017-05-07 ENCOUNTER — Encounter: Payer: Self-pay | Admitting: Gastroenterology

## 2017-05-07 VITALS — BP 112/68 | HR 84 | Ht <= 58 in | Wt 175.5 lb

## 2017-05-07 DIAGNOSIS — K2100 Gastro-esophageal reflux disease with esophagitis, without bleeding: Secondary | ICD-10-CM

## 2017-05-07 DIAGNOSIS — R110 Nausea: Secondary | ICD-10-CM | POA: Diagnosis not present

## 2017-05-07 DIAGNOSIS — A048 Other specified bacterial intestinal infections: Secondary | ICD-10-CM | POA: Diagnosis not present

## 2017-05-07 NOTE — Patient Instructions (Signed)
Follow up as needed

## 2017-05-07 NOTE — Progress Notes (Signed)
Shawna Gonzalez    [id number]    11/22/1951  Primary Care Physician:Perini, Elta Guadeloupe, MD  Referring Physician: Crist Infante, [address] [place], [place] [address]  Chief complaint: Nausea  HPI: 66 year-old female with history of GERD here for follow-up visit. She was last seen in office by Alonza Bogus in March 2018. Patient underwent EGD showed evidence of mild erosive esophagitis and was also positive for H. pylori gastritis. She was treated with 10 day course of Pylera and PPI. Confirmed H. pylori eradication on stool antigen 04/23/2017. Patient reports significant improvement of her symptoms status post H. pylori therapy. She continues to have mild intermittent nausea but significantly better compared to before. Denies any dysphagia, abdominal pain, vomiting, change in bowel habits or blood per rectum   Outpatient Encounter Prescriptions as of 05/07/2017  Medication Sig  . albuterol (PROVENTIL HFA;VENTOLIN HFA) 108 (90 BASE) MCG/ACT inhaler Inhale 2 puffs into the lungs every 6 (six) hours as needed for wheezing or shortness of breath.  . bismuth-metronidazole-tetracycline (PYLERA) 140-125-125 MG capsule Take 3 capsules by mouth 4 (four) times daily -  before meals and at bedtime.  . budesonide-formoterol (SYMBICORT) 160-4.5 MCG/ACT inhaler Inhale 2 puffs into the lungs daily.  . dorzolamide (TRUSOPT) 2 % ophthalmic solution Place 1 drop into both eyes 3 (three) times daily. (Patient taking differently: Place 1 drop into both eyes 2 (two) times daily. )  . esomeprazole (NEXIUM) 40 MG capsule Take 40 mg by mouth every evening.   . fexofenadine (ALLEGRA) 180 MG tablet Take 180 mg by mouth every evening.   . furosemide (LASIX) 40 MG tablet Take 40 mg by mouth every evening.   Marland Kitchen ibuprofen (ADVIL,MOTRIN) 600 MG tablet Take 1 tablet (600 mg total) by mouth every 6 (six) hours as needed (mild pain).  Marland Kitchen losartan (COZAAR) 50 MG tablet Take 50 mg by mouth every evening.   .  Multiple Vitamins-Minerals (MULTIVITAMIN GUMMIES ADULT) CHEW Chew 2 tablets by mouth every evening.  . naproxen sodium (ANAPROX) 220 MG tablet Take 220 mg by mouth 2 (two) times daily as needed (for pain.).  Marland Kitchen omeprazole (PRILOSEC) 20 MG capsule Take 1 capsule (20 mg total) by mouth daily.  . potassium chloride SA (K-DUR,KLOR-CON) 20 MEQ tablet Take 20 mEq by mouth every Tuesday, Thursday, and Saturday at 6 PM.   . traMADol (ULTRAM) 50 MG tablet Take 1 tablet (50 mg total) by mouth every 6 (six) hours as needed for moderate pain.  . Travoprost, BAK Free, (TRAVATAN Z) 0.004 % SOLN ophthalmic solution PLACE 1 DROP INTO BOTH EYES NIGHTLY  . Vitamin D, Ergocalciferol, (DRISDOL) 50000 UNITS CAPS Take 50,000 Units by mouth 2 (two) times a week. Sunday & Thursday   Facility-Administered Encounter Medications as of 05/07/2017  Medication  . 0.9 %  sodium chloride infusion    Allergies as of 05/07/2017 - Review Complete 05/07/2017  Allergen Reaction Noted  . Amlodipine Cough 08/03/2015  . Bydureon [exenatide] Itching and Other (See Comments) 08/03/2015  . Lisinopril Cough 08/03/2015  . Lunesta [eszopiclone] Other (See Comments) 08/03/2015  . Sulfa antibiotics Rash 01/09/2012    Past Medical History:  Diagnosis Date  . Adenomatous colon polyp   . Anemia    history of  . Arthritis   . Asthma   . Atypical chest pain   . Bacterial ear infection 2018  . Cancer ([place])    melanoma skin cancer 7 years ago  .  Diabetes mellitus without complication ([place])    history of taking diabetic medication, has lost weight no longer needing medciation at this time  . GERD (gastroesophageal reflux disease)   . Glaucoma   . Heart murmur    slight  . Hypertension   . Macular degeneration   . Osteoporosis   . Sleep apnea   . Ulcerative colitis, chronic ([place])     Past Surgical History:  Procedure Laterality Date  . CARPAL TUNNEL RELEASE  2010   right  . CATARACT EXTRACTION W/ INTRAOCULAR LENS  IMPLANT,  BILATERAL  2008  . CHOLECYSTECTOMY  2000  . COLONOSCOPY    . DE QUERVAIN'S RELEASE  2008   right wrist  . HYSTERECTOMY ABDOMINAL WITH SALPINGECTOMY Bilateral 01/09/2017   Procedure: HYSTERECTOMY ABDOMINAL WITH SALPINGOOPHORECTOMY;  Surgeon: Dian Queen, MD;  Location: [facility];  Service: Gynecology;  Laterality: Bilateral;  . TONSILLECTOMY    . UPPER GI ENDOSCOPY      Family History  Problem Relation Age of Onset  . Colon polyps Mother   . Heart disease Mother   . Colon polyps Father   . Colon polyps Brother     Social History   Social History  . Marital status: Single    Spouse name: N/A  . Number of children: 0  . Years of education: 34   Occupational History  . customer service [employer]   Social History Main Topics  . Smoking status: Never Smoker  . Smokeless tobacco: Never Used  . Alcohol use No  . Drug use: No  . Sexual activity: Not on file   Other Topics Concern  . Not on file   Social History Narrative   Lives at home with mother.   Caffeine use: Diet and caffeine free soda (8 drinks per day)         Epworth Sleepiness Scale = 7 (as of 01/26/2016)      Review of systems: Review of Systems  Constitutional: Negative for fever and chills.  HENT: Negative.   Eyes: Negative for blurred vision.  Respiratory: Negative for cough, shortness of breath and wheezing.   Cardiovascular: Negative for chest pain and palpitations.  Gastrointestinal: as per HPI Genitourinary: Negative for dysuria, urgency, frequency and hematuria.  Musculoskeletal: Negative for myalgias, back pain and joint pain.  Skin: Positive for itching and rash.  Neurological: Negative for dizziness, tremors, focal weakness, seizures and loss of consciousness.  Endo/Heme/Allergies: Positive for seasonal allergies.  Psychiatric/Behavioral: Negative for depression, suicidal ideas and hallucinations.  All other systems reviewed and are negative.   Physical Exam: Vitals:   05/07/17 1454    BP: 112/68  Pulse: 84   Body mass index is 37.65 kg/m. Gen:      No acute distress HEENT:  EOMI, sclera anicteric Neck:     No masses; no thyromegaly Lungs:    Clear to auscultation bilaterally; normal respiratory effort CV:         Regular rate and rhythm; no murmurs Abd:      + bowel sounds; soft, non-tender; no palpable masses, no distension Ext:    No edema; adequate peripheral perfusion Skin:      Warm and dry; + rash Likely secondary to poison ivy Neuro: alert and oriented x 3 Psych: normal mood and affect  Data Reviewed:  Reviewed labs, radiology imaging, old records and pertinent past GI work up   Assessment and Plan/Recommendations:  67 year old female with history of obstructive sleep apnea, obesity, GERD, mild erosive esophagitis and  H. pylori gastritis status post therapy here for follow-up visit Overall patient is doing well with minimal symptoms She continues to have mild intermittent nausea Continue PPI daily, Nexium 40 mg before breakfast Avoid NSAIDs Antireflux measures  Colorectal cancer screening: Average risk Last colonoscopy in February 2013 with removal of hyperplastic polyps and prior to that in November 2005, evidence of mild colitis in left colon endoscopically but biopsies with no abnormality  15 minutes was spent face-to-face with the patient. Greater than 50% of the time used for counseling as well as treatment plan and follow-up GERD. She had multiple questions which were answered to her satisfaction  K. Denzil Magnuson , [place] [phone] Mon-Fri 8a-5p [phone] after 5p, weekends, holidays  CC: Crist Infante, MD

## 2017-05-08 DIAGNOSIS — L255 Unspecified contact dermatitis due to plants, except food: Secondary | ICD-10-CM | POA: Diagnosis not present

## 2017-05-22 DIAGNOSIS — M810 Age-related osteoporosis without current pathological fracture: Secondary | ICD-10-CM | POA: Diagnosis not present

## 2017-07-05 ENCOUNTER — Encounter (HOSPITAL_BASED_OUTPATIENT_CLINIC_OR_DEPARTMENT_OTHER): Payer: Self-pay | Admitting: *Deleted

## 2017-07-12 ENCOUNTER — Ambulatory Visit (HOSPITAL_BASED_OUTPATIENT_CLINIC_OR_DEPARTMENT_OTHER): Admit: 2017-07-12 | Payer: 59 | Admitting: Orthopedic Surgery

## 2017-07-12 ENCOUNTER — Encounter (HOSPITAL_BASED_OUTPATIENT_CLINIC_OR_DEPARTMENT_OTHER): Payer: Self-pay

## 2017-07-12 SURGERY — CORRECTION, HAMMER TOE
Anesthesia: General | Laterality: Right

## 2017-07-23 ENCOUNTER — Other Ambulatory Visit: Payer: Self-pay | Admitting: Neurology

## 2017-07-23 MED ORDER — DORZOLAMIDE HCL 2 % OP SOLN: 1.0000 [drp] | Freq: Three times a day (TID) | OPHTHALMIC | 6 refills | Status: DC

## 2018-01-03 DIAGNOSIS — H40023 Open angle with borderline findings, high risk, bilateral: Secondary | ICD-10-CM | POA: Insufficient documentation

## 2018-01-24 ENCOUNTER — Encounter: Payer: Self-pay | Admitting: Neurology

## 2018-01-24 ENCOUNTER — Ambulatory Visit: Payer: Medicare Other | Admitting: Neurology

## 2018-01-24 VITALS — BP 128/82 | HR 96 | Ht <= 58 in | Wt 175.0 lb

## 2018-01-24 DIAGNOSIS — G4734 Idiopathic sleep related nonobstructive alveolar hypoventilation: Secondary | ICD-10-CM | POA: Diagnosis not present

## 2018-01-24 DIAGNOSIS — Z9989 Dependence on other enabling machines and devices: Secondary | ICD-10-CM

## 2018-01-24 DIAGNOSIS — G4701 Insomnia due to medical condition: Secondary | ICD-10-CM | POA: Diagnosis not present

## 2018-01-24 DIAGNOSIS — G8929 Other chronic pain: Secondary | ICD-10-CM

## 2018-01-24 DIAGNOSIS — G4733 Obstructive sleep apnea (adult) (pediatric): Secondary | ICD-10-CM | POA: Diagnosis not present

## 2018-01-24 NOTE — Patient Instructions (Signed)

## 2018-01-24 NOTE — Progress Notes (Signed)
[facility]   Provider:  Larey Seat, M D  Referring Provider: Crist Infante, MD Primary Care Physician:  Crist Infante, MD  Chief Complaint  Patient presents with  . Follow-up    pt alone, rm 10. pt states that CPAP is working well. in the last month she has anywhere from 3-6 hours  of sleep on avg.     HPI:  Shawna Gonzalez is a 67 y.o. female , seen here as a referral  from Dr. Joylene Draft for sleep hypoxemia, question if sleep apnea is presented.  Chief complaint according to patient : I had a CPAP in the past but it didn't work well for me "I'm using oxygen at night but I have to wake up 4-5 times to go to the bathroom interrupting my sleep."  Sleep habits are as follows: Shawna Gonzalez reports that she often falls asleep in a chair in the living room rather than in her own bed, and that she feels drowsy or excessively daytime sleepy several times a day. She also reports a very fragmented sleep wake pattern. She often wakes up from sleep because of nocturia and her bathroom breaks continue in daytime. She may go to sleep inadvertently in the recliner at about 8 or 9 PM and then transferred hour later to the bedroom proper. She may there fall asleep for an hour to 2 hours , her sleep is fragmented and these little portions. Sometimes she is too tired to sleepy to transfer from the recliner to the bedroom when she will remain all night in the recliner. In the morning she tries to rise at about 6:30 AM she prepares breakfast for family, and by 7:15 she has to leave for her work. 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 She does not have a shift work history. Shawna Gonzalez's medical history is as follows. She has some morbid obesity, diabetes mellitus type 2, osteoporosis, sleep related hypoxemia with partially treated obstructive sleep apnea or untreated obstructive sleep apnea. 7 years ago she was diagnosed with a melanoma. She had a vitamin D  deficiency, allergic rhinitis, and congenital nystagmus. Her family history is positive for arthritis, diabetes, hypertension stroke and CVA. Sleep medical history and family sleep history: she was diagnosed with OSA in 2009, but could not tolerate CPAP. Social history:  The patient lives with her mother she is single and has no children, she does not drink alcohol she does not smoke she does not use recreational drugs.   01-18-16 Shawna Gonzalez was just last week treated with an IV infusion for a severe asthma attack. She has asthma related sleep hypoxemia she has obstructive sleep apnea and underwent CPAP titration on 10/18/2015. She has continued to use on CPAP between 10 and 15 cm water with 37 m EPR and was a prolonged ramp time. Her download data obtained in the office look excellent. 97% compliance 6 hours and 5 minutes of daily use and a residual AHI of only 1.5. She does not have oxygen at home. She is using multiple medications and rescue inhalers for her underlying condition of asthma and hypoventilation. Her asthma exacerbated with allergies.  She is often sleeping in her recliner watching TV, and goes from there to the bed, continuing to sleep.   Interval history from 2-1- 2018 = pleasure of seeing Shawna Gonzalez was medical history includes now a recent hysterectomy. She has been more sleep deprived over the last 10 days or so  and during her hospitalization could not bring her CPAP with her. Her compliance is accordingly reduced the hospital did not provide a CPAP. Her average user time is now reduced to 3 hours and 6 minutes but related to medical reasons. She is using an AutoSet between 10 and 15 cm water with 3 cm EPR and her 95th percentile pressure is at 12.9 cm, her residual apnea index is 1.4 which is a great resolution. There are no major air leaks noted. Her Epworth sleepiness score is reduced to 4 points from 13 prior to CPAP her fatigue severity is 9 points only and her depression  score is endorsed at 2 out of 15 possible points.   I have the pleasure of seeing Shawna Gonzalez today on 24 January 2018 and I scheduled revisit. She has as usual done exemplary well with the use of her CPAP 87%compliance.  Average use of time 5 hours 52 minutes, she is using an AutoSet between 10 and 15 cm water pressure with 3 cm EPR, her residual AHI is only 0.5 apneas per hour.  She still has air leaks but what is more concerning is that she reports she wakes up so frequently that the sleep would be considered heavily fragmented.  Often she doubts that she even gets 5 hours of sleep . She denies nocturia but has a lot of hip pain and discomfort.   In December 2018 she received a shot of cortisone in her knee which has helped somewhat with the knee pain, however the hip pain is now most dominant. She  endorsed the Epworth sleepiness score at 0 points, fatigue severity at 14 points, she does not take daytime naps.  The geriatric depression score was endorsed at 1 out of 15 points.  Review of Systems: hip. Knee and back pain. Obesity.  Out of a complete 14 system review, the patient complains of only the following symptoms, and all other reviewed systems are negative. Weight gain, Epworth sleepiness score 0 from 13 points pre CPAP ,  Fatigue severity score was 45 points before CPAP , geriatric depression score 1 point.  Her iPad transmits data from her Fitbit, she has not noted helped her getting better or sounder sleep or just sleeping longer.   Social History   Socioeconomic History  . Marital status: Single    Spouse name: Not on file  . Number of children: 0  . Years of education: 75  . Highest education level: Not on file  Social Needs  . Financial resource strain: Not on file  . Food insecurity - worry: Not on file  . Food insecurity - inability: Not on file  . Transportation needs - medical: Not on file  . Transportation needs - non-medical: Not on file  Occupational History    . Occupation: customer service    Employer: [employer]  Tobacco Use  . Smoking status: Never Smoker  . Smokeless tobacco: Never Used  Substance and Sexual Activity  . Alcohol use: No  . Drug use: No  . Sexual activity: Not on file  Other Topics Concern  . Not on file  Social History Narrative   Lives at home with mother.   Caffeine use: Diet and caffeine free soda (8 drinks per day)         Epworth Sleepiness Scale = 7 (as of 01/26/2016)    Family History  Problem Relation Age of Onset  . Colon polyps Mother   . Heart disease Mother   . Colon  polyps Father   . Colon polyps Brother     Past Medical History:  Diagnosis Date  . Adenomatous colon polyp   . Anemia    history of  . Arthritis   . Asthma   . Atypical chest pain   . Bacterial ear infection 2018  . Cancer ([place])    melanoma skin cancer 7 years ago  . Diabetes mellitus without complication ([place])    history of taking diabetic medication, has lost weight no longer needing medciation at this time  . GERD (gastroesophageal reflux disease)   . Glaucoma   . Heart murmur    slight  . Hypertension   . Macular degeneration   . Osteoporosis   . Sleep apnea   . Ulcerative colitis, chronic ([place])     Past Surgical History:  Procedure Laterality Date  . CARPAL TUNNEL RELEASE Right 12/16/2009  . CATARACT EXTRACTION W/ INTRAOCULAR LENS  IMPLANT, BILATERAL Bilateral 05/21/2007(Right); 06/04/2007(Left)  . CHOLECYSTECTOMY  2000  . COLONOSCOPY WITH PROPOFOL  01/23/2012  . DE QUERVAIN'S RELEASE Right 11/30/2005  . HYSTERECTOMY ABDOMINAL WITH SALPINGECTOMY Bilateral 01/09/2017   Procedure: HYSTERECTOMY ABDOMINAL WITH SALPINGOOPHORECTOMY;  Surgeon: Dian Queen, MD;  Location: [facility];  Service: Gynecology;  Laterality: Bilateral;  . TONSILLECTOMY    . UPPER GI ENDOSCOPY  03/05/2017   MAC    Current Outpatient Medications  Medication Sig Dispense Refill  . albuterol (PROVENTIL HFA;VENTOLIN HFA) 108 (90 BASE) MCG/ACT  inhaler Inhale 2 puffs into the lungs every 6 (six) hours as needed for wheezing or shortness of breath.    . bismuth-metronidazole-tetracycline (PYLERA) 140-125-125 MG capsule Take 3 capsules by mouth 4 (four) times daily -  before meals and at bedtime. 168 capsule 0  . budesonide-formoterol (SYMBICORT) 160-4.5 MCG/ACT inhaler Inhale 2 puffs into the lungs daily.    . dorzolamide (TRUSOPT) 2 % ophthalmic solution Place 1 drop into both eyes 3 (three) times daily. 10 mL 6  . esomeprazole (NEXIUM) 40 MG capsule Take 40 mg by mouth every evening.     . fexofenadine (ALLEGRA) 180 MG tablet Take 180 mg by mouth every evening.     . furosemide (LASIX) 40 MG tablet Take 40 mg by mouth every evening.   4  . ibuprofen (ADVIL,MOTRIN) 600 MG tablet Take 1 tablet (600 mg total) by mouth every 6 (six) hours as needed (mild pain). 60 tablet 0  . losartan (COZAAR) 50 MG tablet Take 50 mg by mouth every evening.     . Multiple Vitamins-Minerals (MULTIVITAMIN GUMMIES ADULT) CHEW Chew 2 tablets by mouth every evening.    . naproxen sodium (ANAPROX) 220 MG tablet Take 220 mg by mouth 2 (two) times daily as needed (for pain.).    Marland Kitchen omeprazole (PRILOSEC) 20 MG capsule Take 1 capsule (20 mg total) by mouth daily. 28 capsule 0  . potassium chloride SA (K-DUR,KLOR-CON) 20 MEQ tablet Take 20 mEq by mouth every Tuesday, Thursday, and Saturday at 6 PM.     . traMADol (ULTRAM) 50 MG tablet Take 1 tablet (50 mg total) by mouth every 6 (six) hours as needed for moderate pain. 30 tablet 0  . Travoprost, BAK Free, (TRAVATAN Z) 0.004 % SOLN ophthalmic solution PLACE 1 DROP INTO BOTH EYES NIGHTLY    . Vitamin D, Ergocalciferol, (DRISDOL) 50000 UNITS CAPS Take 50,000 Units by mouth 2 (two) times a week. Sunday & Thursday     No current facility-administered medications for this visit.     Allergies as of  01/24/2018 - Review Complete 01/24/2018  Allergen Reaction Noted  . Amlodipine Cough 08/03/2015  . Bydureon [exenatide]  Itching and Other (See Comments) 08/03/2015  . Lisinopril Cough 08/03/2015  . Lunesta [eszopiclone] Other (See Comments) 08/03/2015  . Sulfa antibiotics Rash 01/09/2012    Vitals: BP 128/82   Pulse 96   Ht _0  (1.473 m)   Wt 175 lb (79.4 kg)   BMI 36.58 kg/m  Last Weight:  Wt Readings from Last 1 Encounters:  01/24/18 175 lb (79.4 kg)   DXA:JOIN mass index is 36.58 kg/m.     Last Height:   Ht Readings from Last 1 Encounters:  01/24/18 _1  (1.473 m)    Physical exam:  General: The patient is awake, alert and appears not in acute distress. The patient is well groomed. Head: Normocephalic, atraumatic. Neck is supple. Mallampati 4 ,  neck circumference: 18.5 . Nasal airflow restricted, TMJ is not  evident . Retrognathia is seen.  Cardiovascular:  Regular rate and rhythm , without  murmurs or carotid bruit, and without distended neck veins. Respiratory: Lungs are clear to auscultation. Skin:  Without evidence of edema, or rash Trunk: BMI is down to 37 from 50 at the time of PSG. The patient's abdomen and torso is most affected   Neurologic exam : The patient is awake and alert, oriented to place and time.   Speech is fluent,  without  dysarthria, dysphonia or aphasia.  She is not longer short of breath,  Mood and affect are irritated.  Cranial nerves: Pupils are equal and briskly reactive to light. She has nystagmus, with Primary nystagmus is still present. She has non conjugate eye movements. Macular degeneration. Wet type. Visual fields by finger perimetry impaired-  not scotoma, but peripheral vision changes, tunnel vision.  Legally blind.    Facial motor strength is symmetric and tongue and uvula move midline. Shoulder shrug was symmetrical.    The patient was advised of the nature of the diagnosed sleep disorder , the treatment options and risks for general a health and wellness arising from not treating the condition.  I spent more than 15  minutes of face to face  time with greater than 50% of time  spent in counseling and coordination of care. We have discussed the diagnosis and differential and I answered the patient's questions.   Shawna Gonzalez has undergone several pulse oximetries at home with and without oxygen but I don't have the original records here to review. 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   Assessment:  After physical and neurologic examination, review of laboratory studies,  Personal review of imaging studies, reports of other /same  Imaging studies ,  Results of polysomnography/ neurophysiology testing and pre-existing records as far as provided in visit., my assessment is    1) Asthma and OSA overlapping syndrome.  Obesity :She also had signs of obesity hypoventilation,  She  has done very well with CPAP which has also made oxygen supplementation obsolete. Reported chronic sleep related hypoxemia is treated by CPAP, in an  asthmatic and morbidly obese patient.    3)Highly compliant CPAP patient. Low residual AHI, but fragmented sleep.  Insomnia secondary to chronic joint pain.  She is no longer having as much nocturia.  She comes nowhere close to the recommended 6-8 hours of nocturnal sleep.  Her obesity is truncal affecting her diaphragmatic strength and her chest wall movements. Continue CPAP, try to lose weight.  Plan: RV with NP in 12 month.     Asencion Partridge Sherma Vanmetre MD  01/24/2018   CC: Crist Infante, [place] [place], [place] [id number]

## 2018-02-06 DIAGNOSIS — M545 Low back pain, unspecified: Secondary | ICD-10-CM | POA: Insufficient documentation

## 2018-03-22 IMAGING — CT CT ABD-PELV W/ CM
2 of 5 series · 16 of 46 positions shown, 18 images · IV contrast (iopamidol)
Comparison: 10/10/2004 and overlapping portions of CT chest from
09/18/2012

CLINICAL DATA: Persistent abdominal pain, bloating, and nausea.
History of gastritis.

EXAM:
CT ABDOMEN AND PELVIS WITH CONTRAST
TECHNIQUE: Multidetector CT imaging of the abdomen and pelvis was performed
using the standard protocol following bolus administration of
intravenous contrast.
CONTRAST:  100mL BOENVA-VGG IOPAMIDOL (BOENVA-VGG) INJECTION 61%

[Series 2: abd/pel w · axial · 0.81mm/px · z∈[-493,-113]mm · 13 of 86 slices shown, 15 images]
[im 5/86  soft-tissue]
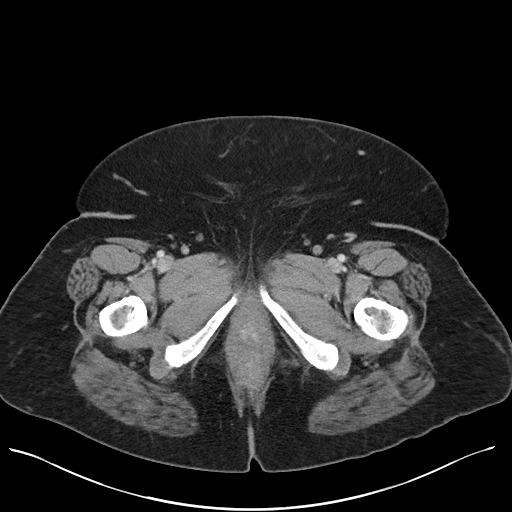
[im 5/86  bone]
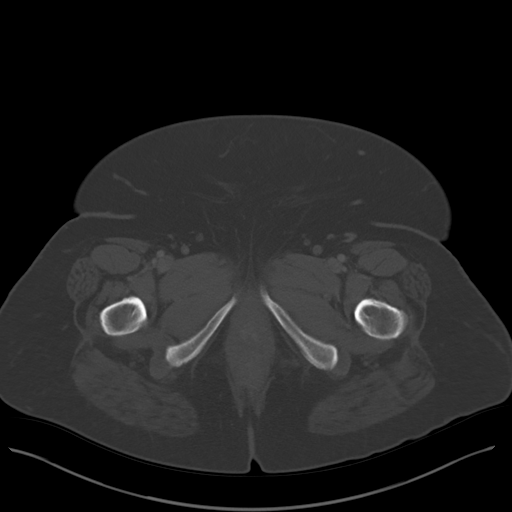
[im 10/86  soft-tissue]
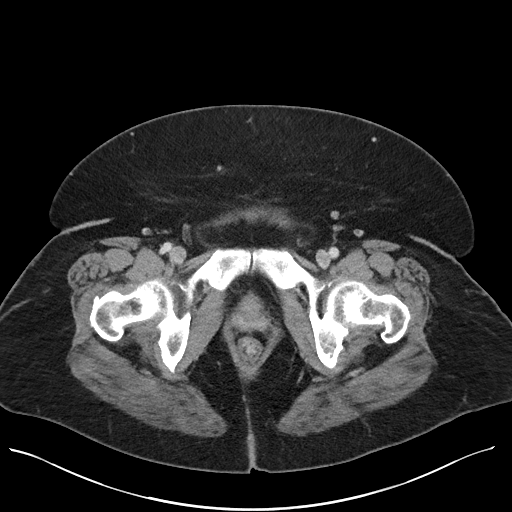
[im 19/86  soft-tissue]
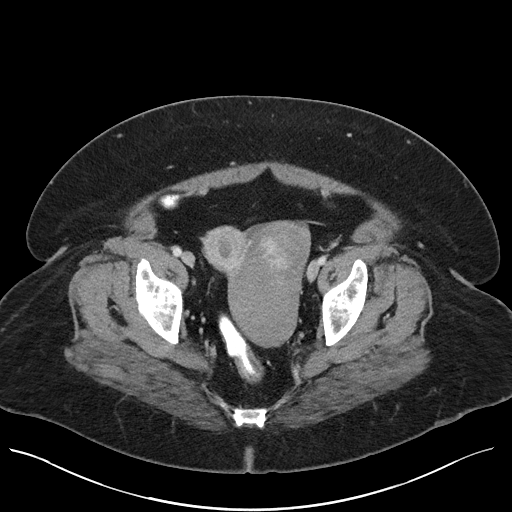
[im 24/86  soft-tissue]
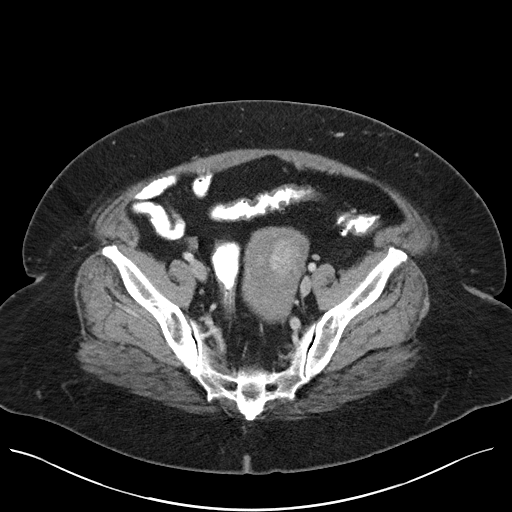
[im 29/86  soft-tissue]
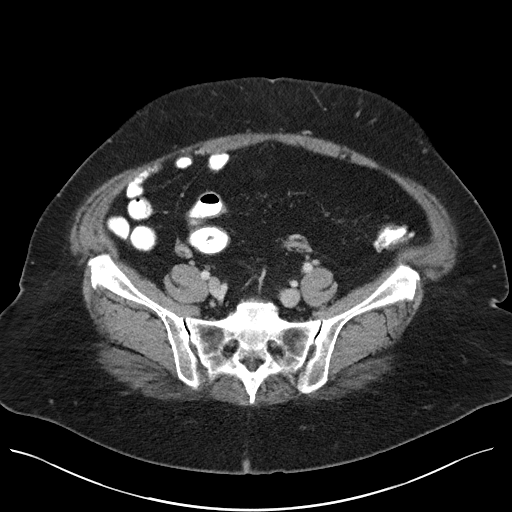
[im 38/86  soft-tissue]
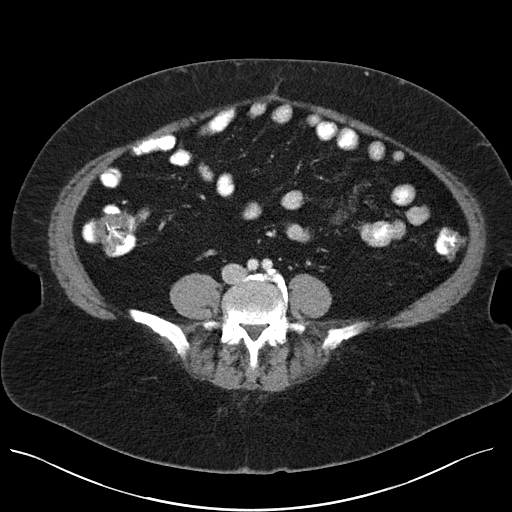
[im 43/86  soft-tissue]
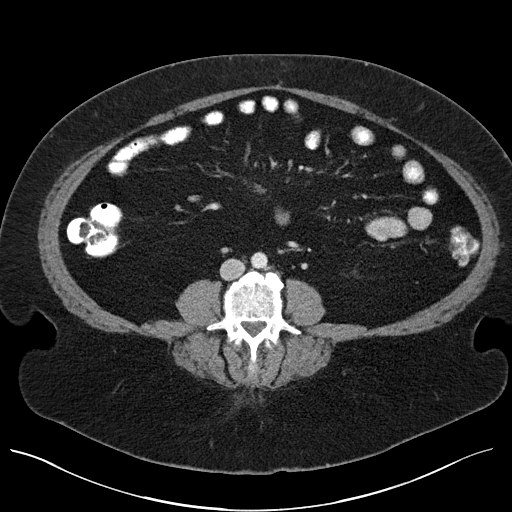
[im 48/86  soft-tissue]
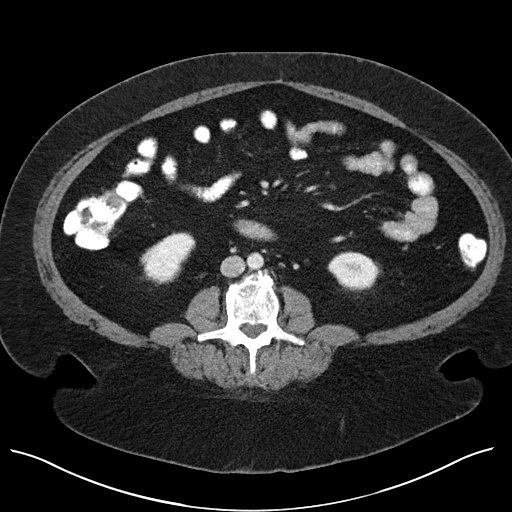
[im 57/86  soft-tissue]
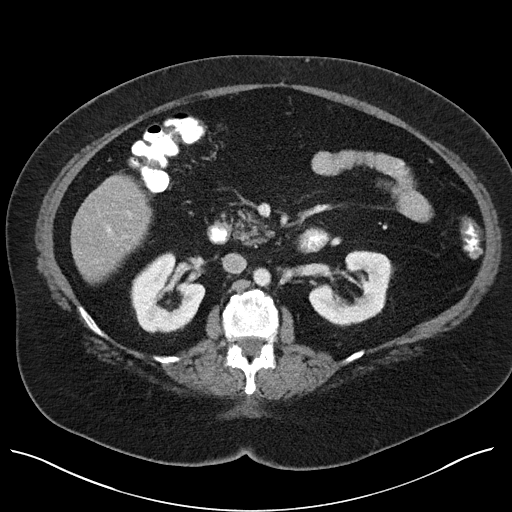
[im 57/86  bone]
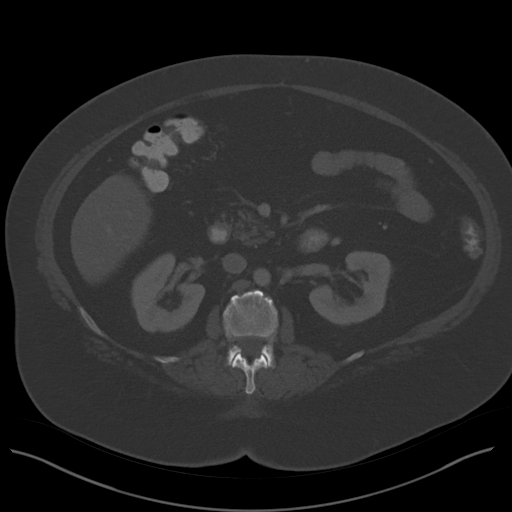
[im 62/86  soft-tissue]
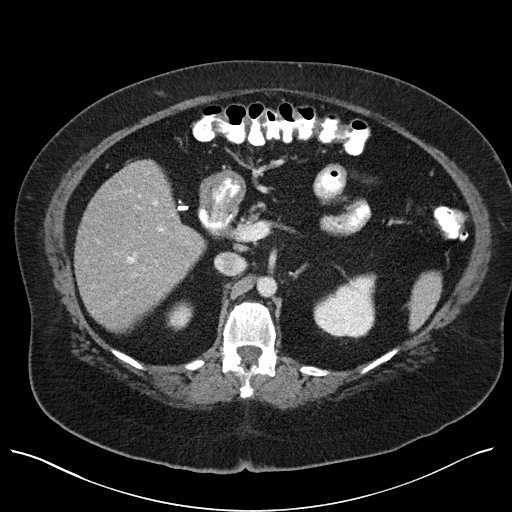
[im 67/86  soft-tissue]
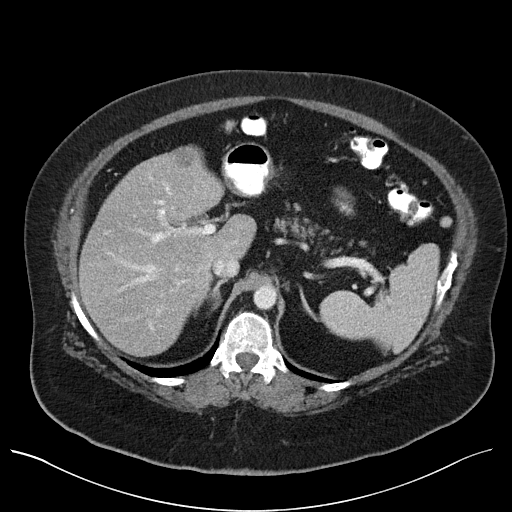
[im 76/86  soft-tissue]
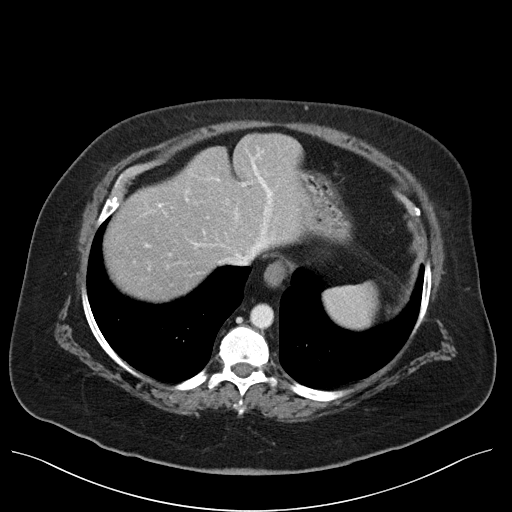
[im 81/86  soft-tissue]
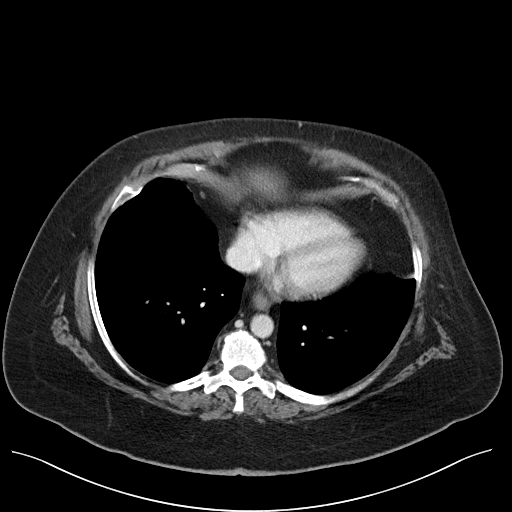

[Series 7: abd/pel w st · coronal · 0.84mm/px · 3 of 101 slices shown]
[im 34/101  soft-tissue]
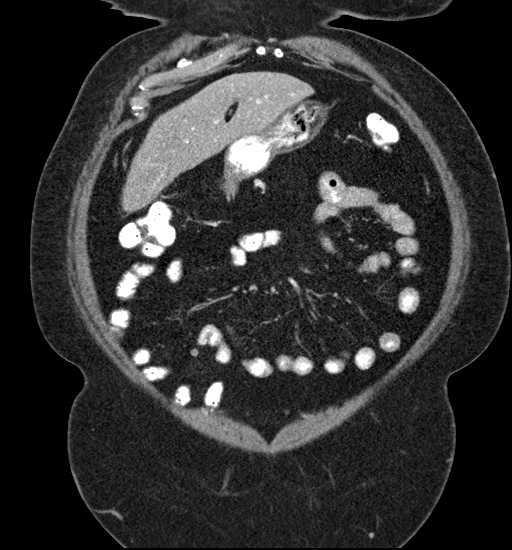
[im 45/101  soft-tissue]
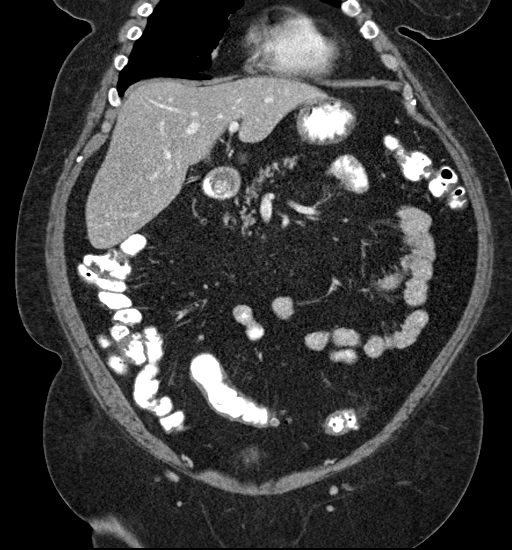
[im 56/101  soft-tissue]
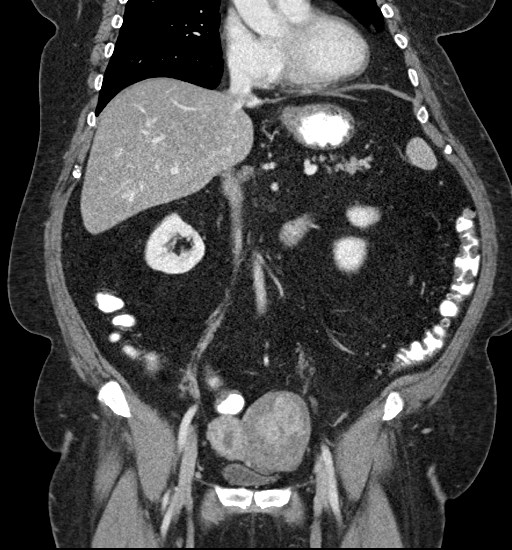

[16 of 46 positions shown; findings below may reference images not displayed]

FINDINGS: Lower chest: Prominent epicardial adipose tissue.

Hepatobiliary: Diffuse hepatic steatosis. This is focally more
pronounced adjacent to the falciform ligament on image [DATE].
Cholecystectomy.

Pancreas: Unremarkable

Spleen: Unremarkable

Adrenals/Urinary Tract: Unremarkable

Stomach/Bowel: Descending and sigmoid diverticulosis without active
diverticulitis.

Vascular/Lymphatic: Unremarkable

Reproductive: Left adnexal mass measures 10.4 by 6.0 by 6.9 cm
(volume = 230 cm^3) and previously measured 7.6 by 4.1 by 5.0 cm
(volume = 82 cm^3) back on 10/10/2004. This has heterogeneous
density with irregular mixed higher density anteriorly but otherwise
is homogeneous. The uterus is deviated to the right.

Other: No supplemental non-categorized findings.

Musculoskeletal: Unremarkable
IMPRESSION: 1. The left adnexal mass assumed to be a pedunculated fibroid is
times larger in volume than it was on 10/10/2004. I would assume
given the patient's age of nearly 66 years that she has been
postmenopausal since that time, and this constitutes a suspicious
increase in size which would not typically be accounted for simply
by hormone replacement therapy. Based on the patient's age she may
be at risk of sarcomatous degeneration of the fibroid, and the
increase in size is concerning. Consider hysterectomy.
2. Hepatic steatosis.
3. Descending and sigmoid diverticulosis.
These results will be called to the ordering clinician or
representative by the Radiologist Assistant, and communication
documented in the PACS or zVision Dashboard.

## 2018-04-23 DIAGNOSIS — M79642 Pain in left hand: Secondary | ICD-10-CM | POA: Insufficient documentation

## 2018-09-25 ENCOUNTER — Other Ambulatory Visit: Payer: Self-pay | Admitting: Neurology

## 2018-09-26 ENCOUNTER — Other Ambulatory Visit: Payer: Self-pay | Admitting: Neurology

## 2019-01-27 ENCOUNTER — Ambulatory Visit: Payer: Medicare Other | Admitting: Neurology

## 2019-01-27 ENCOUNTER — Encounter: Payer: Self-pay | Admitting: Neurology

## 2019-01-27 VITALS — BP 172/96 | HR 96 | Ht <= 58 in | Wt 203.0 lb

## 2019-01-27 DIAGNOSIS — Z9989 Dependence on other enabling machines and devices: Secondary | ICD-10-CM

## 2019-01-27 DIAGNOSIS — Z6841 Body Mass Index (BMI) 40.0 and over, adult: Secondary | ICD-10-CM

## 2019-01-27 DIAGNOSIS — G4733 Obstructive sleep apnea (adult) (pediatric): Secondary | ICD-10-CM

## 2019-01-27 DIAGNOSIS — J4540 Moderate persistent asthma, uncomplicated: Secondary | ICD-10-CM | POA: Diagnosis not present

## 2019-01-27 DIAGNOSIS — Z730 Burn-out: Secondary | ICD-10-CM

## 2019-01-27 DIAGNOSIS — G4734 Idiopathic sleep related nonobstructive alveolar hypoventilation: Secondary | ICD-10-CM | POA: Diagnosis not present

## 2019-01-27 NOTE — Progress Notes (Signed)
S[facility]EP [facility]INE [facility]IC   Provider:  Larey Gonzalez, M D  Referring Provider: Crist Infante, MD Primary Care Physician:  Shawna Infante, MD  Chief Complaint  Patient presents with  . Follow-up    patient alone, rm 11. pt here for yearly cpap follow up. pt DME [facility]. states things are going well      Shawna Gonzalez is a 68 y.o. female patient and recently  Retired, now seen on 01-27-2019. She has high BP today, is not short of breath, and she reports no pain at this time.  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  She had hoped to spend her retirement with different activities traveling etc.       HPI:  seen here as a referral  from Dr. Joylene Gonzalez for sleep hypoxemia, question if sleep apnea is presented.  Chief complaint according to patient : I had a CPAP in the past but it didn't work well for me "I'm using oxygen at night but I have to wake up 4-5 times to go to the bathroom interrupting my sleep."  Sleep habits are as follows: [name] reports that she often falls asleep in a chair in the living room rather than in her own bed, and that she feels drowsy or excessively daytime sleepy several times a day. She also reports a very fragmented sleep wake pattern. She often wakes up from sleep because of nocturia and her bathroom breaks continue in daytime. She may go to sleep inadvertently in the recliner at about 8 or 9 PM and then transferred hour later to the bedroom proper. She may there fall asleep for an hour to 2 hours , her sleep is fragmented and these little portions. Sometimes she is too tired to sleepy to transfer from the recliner to the bedroom when she will remain all night in the recliner. In the morning she tries to rise at about 6:30 AM she prepares breakfast for family, and by 7:15 she has to leave for her work. 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 She does not have a shift work history. [name]'s medical history is as follows. She has some morbid obesity, diabetes mellitus type 2, osteoporosis, sleep related hypoxemia with partially treated obstructive sleep apnea or untreated obstructive sleep apnea. 7 years ago she was diagnosed with a melanoma. She had a vitamin D deficiency, allergic rhinitis, and congenital nystagmus. Her family history is positive for arthritis, diabetes, hypertension stroke and CVA. Sleep medical history and family sleep history: she was diagnosed with OSA in 2009, but could not tolerate CPAP. Social history:  The patient lives with her mother she is single and has no children, she does not drink alcohol she does not smoke she does not use recreational drugs.   01-18-16 [name] was just last week treated with an IV infusion for a severe asthma attack. She has asthma related sleep hypoxemia she has obstructive sleep apnea and underwent CPAP titration on 10/18/2015. She has continued to use on CPAP between 10 and 15 cm water with 37 m EPR and was a prolonged ramp time. Her download data obtained in the office look excellent. 97% compliance 6 hours and 5 minutes of daily use and a residual AHI of only 1.5. She does not have oxygen at home. She is using multiple medications and rescue inhalers for her underlying condition of asthma and hypoventilation. Her asthma exacerbated with allergies.  She is often sleeping in her recliner watching TV, and goes from there to the bed, continuing to sleep.   Interval history from 2-1- 2018 = pleasure of seeing [name] was medical history includes now a recent hysterectomy. She has been more sleep deprived over the last 10 days or so and during her hospitalization could not bring her CPAP with her. Her compliance is accordingly reduced the hospital did not provide a CPAP. Her average user time is now reduced to 3 hours and 6 minutes but related to medical reasons. She is using an AutoSet between 10 and 15 cm water  with 3 cm EPR and her 95th percentile pressure is at 12.9 cm, her residual apnea index is 1.4 which is a great resolution. There are no major air leaks noted. Her Epworth sleepiness score is reduced to 4 points from 13 prior to CPAP her fatigue severity is 9 points only and her depression score is endorsed at 2 out of 15 possible points.   I have the pleasure of seeing [name] today on 24 January 2018 and I scheduled revisit. She has as usual done exemplary well with the use of her CPAP 87%compliance.  Average use of time 5 hours 52 minutes, she is using an AutoSet between 10 and 15 cm water pressure with 3 cm EPR, her residual AHI is only 0.5 apneas per hour.  She still has air leaks but what is more concerning is that she reports she wakes up so frequently that the sleep would be considered heavily fragmented.  Often she doubts that she even gets 5 hours of sleep . She denies nocturia but has a lot of hip pain and discomfort. In December 2018 she received a shot of cortisone in her knee which has helped somewhat with the knee pain, however the hip pain is now most dominant. She  endorsed the Epworth sleepiness score at 0 points, fatigue severity at 14 points, she does not take daytime naps.  The geriatric depression score was endorsed at 1 out of 15 points.  Review of Systems: hip. Knee and back pain. Obesity.  Out of a complete 14 system review, the patient complains of only the following symptoms, and all other reviewed systems are negative. Weight gain, Epworth sleepiness score 0 from 13 points pre CPAP ,  Fatigue severity score was 45 points before CPAP , geriatric depression score 1 point.  Her iPad transmits data from her Fitbit, she has not noted helped her getting better or sounder sleep or just sleeping longer.   Social History   Socioeconomic History  . Marital status: Single    Spouse name: Not on file  . Number of children: 0  . Years of education: 81  . Highest education  level: Not on file  Occupational History  . Occupation: Research scientist (physical sciences): [employer]  . Financial resource strain: Not on file  . Food insecurity:    Worry: Not on file    Inability: Not on file  . Transportation needs:    Medical: Not on file    Non-medical: Not on file  Tobacco Use  . Smoking status: Never Smoker  . Smokeless tobacco: Never Used  Substance and Sexual Activity  . Alcohol use: No  . Drug use: No  . Sexual activity: Not on file  Lifestyle  . Physical activity:    Days per week: Not on file    Minutes per session: Not on file  .  Stress: Not on file  Relationships  . Social connections:    Talks on phone: Not on file    Gets together: Not on file    Attends religious service: Not on file    Active member of club or organization: Not on file    Attends meetings of clubs or organizations: Not on file    Relationship status: Not on file  . Intimate partner violence:    Fear of current or ex partner: Not on file    Emotionally abused: Not on file    Physically abused: Not on file    Forced sexual activity: Not on file  Other Topics Concern  . Not on file  Social History Narrative   Lives at home with mother.   Caffeine use: Diet and caffeine free soda (8 drinks per day)         Epworth Sleepiness Scale = 7 (as of 01/26/2016)    Family History  Problem Relation Age of Onset  . Colon polyps Mother   . Heart disease Mother   . Colon polyps Father   . Colon polyps Brother     Past Medical History:  Diagnosis Date  . Adenomatous colon polyp   . Anemia    history of  . Arthritis   . Asthma   . Atypical chest pain   . Bacterial ear infection 2018  . Cancer ([place])    melanoma skin cancer 7 years ago  . Diabetes mellitus without complication ([place])    history of taking diabetic medication, has lost weight no longer needing medciation at this time  . GERD (gastroesophageal reflux disease)   . Glaucoma   . Heart murmur     slight  . Hypertension   . Macular degeneration   . Osteoporosis   . Sleep apnea   . Ulcerative colitis, chronic ([place])     Past Surgical History:  Procedure Laterality Date  . CARPAL TUNNEL RELEASE Right 12/16/2009  . CATARACT EXTRACTION W/ INTRAOCULAR LENS  IMPLANT, BILATERAL Bilateral 05/21/2007(Right); 06/04/2007(Left)  . CHOLECYSTECTOMY  2000  . COLONOSCOPY WITH PROPOFOL  01/23/2012  . DE QUERVAIN'S RELEASE Right 11/30/2005  . HYSTERECTOMY ABDOMINAL WITH SALPINGECTOMY Bilateral 01/09/2017   Procedure: HYSTERECTOMY ABDOMINAL WITH SALPINGOOPHORECTOMY;  Surgeon: Dian Queen, MD;  Location: [facility];  Service: Gynecology;  Laterality: Bilateral;  . TONSILLECTOMY    . UPPER GI ENDOSCOPY  03/05/2017   MAC    Current Outpatient Medications  Medication Sig Dispense Refill  . albuterol (PROVENTIL HFA;VENTOLIN HFA) 108 (90 BASE) MCG/ACT inhaler Inhale 2 puffs into the lungs every 6 (six) hours as needed for wheezing or shortness of breath.    . bismuth-metronidazole-tetracycline (PYLERA) 140-125-125 MG capsule Take 3 capsules by mouth 4 (four) times daily -  before meals and at bedtime. 168 capsule 0  . budesonide-formoterol (SYMBICORT) 160-4.5 MCG/ACT inhaler Inhale 2 puffs into the lungs daily.    . dorzolamide (TRUSOPT) 2 % ophthalmic solution INSTILL 1 DROP INTO BOTH EYES THREE TIMES DAILY 30 mL 0  . esomeprazole (NEXIUM) 40 MG capsule Take 40 mg by mouth every evening.     . fexofenadine (ALLEGRA) 180 MG tablet Take 180 mg by mouth every evening.     . furosemide (LASIX) 40 MG tablet Take 40 mg by mouth every evening.   4  . ibuprofen (ADVIL,MOTRIN) 600 MG tablet Take 1 tablet (600 mg total) by mouth every 6 (six) hours as needed (mild pain). 60 tablet 0  . losartan (COZAAR) 50  MG tablet Take 50 mg by mouth every evening.     . Multiple Vitamins-Minerals (MULTIVITAMIN GUMMIES ADULT) CHEW Chew 2 tablets by mouth every evening.    . naproxen sodium (ANAPROX) 220 MG tablet Take 220 mg by  mouth 2 (two) times daily as needed (for pain.).    Marland Kitchen omeprazole (PRILOSEC) 20 MG capsule Take 1 capsule (20 mg total) by mouth daily. 28 capsule 0  . potassium chloride SA (K-DUR,KLOR-CON) 20 MEQ tablet Take 20 mEq by mouth every Tuesday, Thursday, and Saturday at 6 PM.     . traMADol (ULTRAM) 50 MG tablet Take 1 tablet (50 mg total) by mouth every 6 (six) hours as needed for moderate pain. 30 tablet 0  . Travoprost, BAK Free, (TRAVATAN Z) 0.004 % SOLN ophthalmic solution PLACE 1 DROP INTO BOTH EYES NIGHTLY    . Vitamin D, Ergocalciferol, (DRISDOL) 50000 UNITS CAPS Take 50,000 Units by mouth 2 (two) times a week. Sunday & Thursday     No current facility-administered medications for this visit.     Allergies as of 01/27/2019 - Review Complete 01/27/2019  Allergen Reaction Noted  . Amlodipine Cough 08/03/2015  . Bydureon [exenatide] Itching and Other (See Comments) 08/03/2015  . Lisinopril Cough 08/03/2015  . Lunesta [eszopiclone] Other (See Comments) 08/03/2015  . Sulfa antibiotics Rash 01/09/2012    Vitals: BP (!) 172/96   Pulse 96   Ht _0  (1.473 m)   Wt 203 lb (92.1 kg)   BMI 42.43 kg/m  Last Weight:  Wt Readings from Last 1 Encounters:  01/27/19 203 lb (92.1 kg)   GMW:NUUV mass index is 42.43 kg/m.     Last Height:   Ht Readings from Last 1 Encounters:  01/27/19 _1  (1.473 m)    Review of Systems: the patient has a primary vision loss, baseline strabismus, and she has had signs of obesity hypoventilation in the past.  Blood pressure was found to be elevated today but she is not in pain, she is however under stress.   Epworth sleepiness score was endorsed at 1 point fatigue severity at 23 points, depression score was only endorsed at 1 out of 15 points.  She is a compliant CPAP user over the last 30 days has used the machine every day and every day over 4 hours with an average use at time of 7 hours 12 minutes, she is using an AutoSet between 10 and 15 cm water pressure  with 3 cm EPR, her AHI is 0.7 the 95th percentile pressure is 13.7 cmH2O she does have high air leaks from time to time.  This seems to be a trend since late January and she had highest air leaks in the first week of February.  Physical exam:  General: The patient is awake, alert and appears not in acute distress. The patient is well groomed. Head: Normocephalic, atraumatic. Neck is supple. Mallampati 4 ,  neck circumference: 18.5 . Nasal airflow restricted, TMJ is not  evident . Retrognathia is seen.  Cardiovascular:  Regular rate and rhythm , without  murmurs or carotid bruit, and without distended neck veins. Respiratory: Lungs are clear to auscultation. Skin:  Without evidence of edema, or rash Trunk: BMI is down to 37 from 50 at the time of PSG. The patient's abdomen and torso is most affected   Neurologic exam : The patient is awake and alert, oriented to place and time.   Speech is fluent,  without  dysarthria, dysphonia or aphasia.  She  is not longer short of breath,  Mood and affect are irritated.  Cranial nerves: taste and smell reportedly intact. Pupils are equal and briskly reactive to light. Baseline strabism.  Primary nystagmus is still present. She has non -conjugate eye movements. Macular degeneration. Wet type. Visual fields by finger perimetry impaired-  not scotoma, but peripheral vision changes, tunnel vision. Legally blind.   Facial motor strength is symmetric and tongue and uvula move midline. Shoulder shrug was symmetrical.   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   Assessment:  After physical and neurologic examination, review of laboratory studies,  Personal review of imaging studies, reports of other /same  Imaging studies ,  Results of polysomnography/ neurophysiology testing and pre-existing records as far as provided in visit., my assessment is    1) Asthma and OSA overlapping syndrome.  Obesity : She also had signs of obesity  hypoventilation,  She  has done very well with CPAP which has also made oxygen supplementation obsolete. Reported chronic sleep related hypoxemia is treated by CPAP, in an  asthmatic and morbidly obese patient.      3)Highly compliant CPAP patient. Low residual AHI, but fragmented sleep. 100% compliance again 01-27-2019 Insomnia secondary to chronic joint pain.  She is no longer having as much nocturia.   Her obesity is truncal affecting her diaphragmatic strength and her chest wall movements. Continue CPAP, try to lose weight.  Consider medical weight loss clinic. I would be happy to refer her to Dr. Leafy Ro. She is going to the gym. Currently not on steroids ( for asthma).   Plan: RV with NP in 12 month.     Asencion Partridge Emery Binz MD  01/27/2019   CC: Shawna Gonzalez, [place], [place] [id number]

## 2019-01-29 ENCOUNTER — Encounter: Payer: Self-pay | Admitting: Gastroenterology

## 2019-01-29 ENCOUNTER — Ambulatory Visit: Payer: Medicare Other | Admitting: Gastroenterology

## 2019-01-29 ENCOUNTER — Other Ambulatory Visit: Payer: 59

## 2019-01-29 VITALS — BP 144/98 | HR 78 | Ht <= 58 in | Wt 199.0 lb

## 2019-01-29 DIAGNOSIS — R140 Abdominal distension (gaseous): Secondary | ICD-10-CM

## 2019-01-29 DIAGNOSIS — K219 Gastro-esophageal reflux disease without esophagitis: Secondary | ICD-10-CM | POA: Diagnosis not present

## 2019-01-29 DIAGNOSIS — E739 Lactose intolerance, unspecified: Secondary | ICD-10-CM

## 2019-01-29 DIAGNOSIS — R109 Unspecified abdominal pain: Secondary | ICD-10-CM

## 2019-01-29 DIAGNOSIS — K580 Irritable bowel syndrome with diarrhea: Secondary | ICD-10-CM

## 2019-01-29 DIAGNOSIS — R142 Eructation: Secondary | ICD-10-CM

## 2019-01-29 DIAGNOSIS — R197 Diarrhea, unspecified: Secondary | ICD-10-CM

## 2019-01-29 DIAGNOSIS — R110 Nausea: Secondary | ICD-10-CM

## 2019-01-29 MED ORDER — DEXLANSOPRAZOLE 60 MG PO CPDR: 60.0000 mg | DELAYED_RELEASE_CAPSULE | Freq: Every day | ORAL | 3 refills | Status: DC

## 2019-01-29 NOTE — Patient Instructions (Signed)
We will send Dexilant 60 mg to your pharmacy  Discontinue Nexium  Start over the counter FDGard 1 capsule three times a day as needed  Go to the basement today for labs   Lactose-Free Diet, Adult If you have lactose intolerance, you are not able to digest lactose. Lactose is a natural sugar found mainly in dairy milk and dairy products. You may need to avoid all foods and beverages that contain lactose. A lactose-free diet can help you do this. Which foods have lactose? Lactose is found in dairy milk and dairy products, such as:  Yogurt.  Cheese.  Butter.  Margarine.  Sour cream.  Cream.  Whipped toppings and nondairy creamers.  Ice cream and other dairy-based desserts. Lactose is also found in foods or products made with dairy milk or milk ingredients. To find out whether a food contains dairy milk or a milk ingredient, look at the ingredients list. Avoid foods with the statement "May contain milk" and foods that contain:  Milk powder.  Whey.  Curd.  Caseinate.  Lactose.  Lactalbumin.  Lactoglobulin. What are alternatives to dairy milk and foods made with milk products?  Lactose-free milk.  Soy milk with added calcium and vitamin D.  Almond milk, coconut milk, rice milk, or other nondairy milk alternatives with added calcium and vitamin D. Note that these are low in protein.  Soy products, such as soy yogurt, soy cheese, soy ice cream, and soy-based sour cream.  Other nut milk products, such as almond yogurt, almond cheese, cashew yogurt, cashew cheese, cashew ice cream, coconut yogurt, and coconut ice cream. What are tips for following this plan?  Do not consume foods, beverages, vitamins, minerals, or medicines containing lactose. Read ingredient lists carefully.  Look for the words "lactose-free" on labels.  Use lactase enzyme drops or tablets as directed by your health care provider.  Use lactose-free milk or a milk alternative, such as soy milk or  almond milk, for drinking and cooking.  Make sure you get enough calcium and vitamin D in your diet. A lactose-free eating plan can be lacking in these important nutrients.  Take calcium and vitamin D supplements as directed by your health care provider. Talk to your health care provider about supplements if you are not able to get enough calcium and vitamin D from food. What foods can I eat?  Fruits All fresh, canned, frozen, or dried fruits that are not processed with lactose. Vegetables All fresh, frozen, and canned vegetables without cheese, cream, or butter sauces. Grains Any that are not made with dairy milk or dairy products. Meats and other proteins Any meat, fish, poultry, and other protein sources that are not made with dairy milk or dairy products. Soy cheese and yogurt. Fats and oils Any that are not made with dairy milk or dairy products. Beverages Lactose-free milk. Soy, rice, or almond milk with added calcium and vitamin D. Fruit and vegetable juices. Sweets and desserts Any that are not made with dairy milk or dairy products. Seasonings and condiments Any that are not made with dairy milk or dairy products. Calcium Calcium is found in many foods that contain lactose and is important for bone health. The amount of calcium you need depends on your age:  Adults younger than 50 years: 1,000 mg of calcium a day.  Adults older than 50 years: 1,200 mg of calcium a day. If you are not getting enough calcium, you may get it from other sources, including:  Orange juice with calcium  added. There are 300-350 mg of calcium in 1 cup of orange juice.  Calcium-fortified soy milk. There are 300-400 mg of calcium in 1 cup of calcium-fortified soy milk.  Calcium-fortified rice or almond milk. There are 300 mg of calcium in 1 cup of calcium-fortified rice or almond milk.  Calcium-fortified breakfast cereals. There are 100-1,000 mg of calcium in calcium-fortified breakfast  cereals.  Spinach, cooked. There are 145 mg of calcium in  cup of cooked spinach.  Edamame, cooked. There are 130 mg of calcium in  cup of cooked edamame.  Collard greens, cooked. There are 125 mg of calcium in  cup of cooked collard greens.  Kale, frozen or cooked. There are 90 mg of calcium in  cup of cooked or frozen kale.  Almonds. There are 95 mg of calcium in  cup of almonds.  Broccoli, cooked. There are 60 mg of calcium in 1 cup of cooked broccoli. The items listed above may not be a complete list of recommended foods and beverages. Contact a dietitian for more options. What foods are not recommended? Fruits None, unless they are made with dairy milk or dairy products. Vegetables None, unless they are made with dairy milk or dairy products. Grains Any grains that are made with dairy milk or dairy products. Meats and other proteins None, unless they are made with dairy milk or dairy products. Dairy All dairy products, including milk, goat's milk, buttermilk, kefir, acidophilus milk, flavored milk, evaporated milk, condensed milk, dulce de [place], eggnog, yogurt, cheese, and cheese spreads. Fats and oils Any that are made with milk or milk products. Margarines and salad dressings that contain milk or cheese. Cream. Half and half. Cream cheese. Sour cream. Chip dips made with sour cream or yogurt. Beverages Hot chocolate. Cocoa with lactose. Instant iced teas. Powdered fruit drinks. Smoothies made with dairy milk or yogurt. Sweets and desserts Any that are made with milk or milk products. Seasonings and condiments Chewing gum that has lactose. Spice blends if they contain lactose. Artificial sweeteners that contain lactose. Nondairy creamers. The items listed above may not be a complete list of foods and beverages to avoid. Contact a dietitian for more information. Summary  If you are lactose intolerant, it means that you have a hard time digesting lactose, a natural  sugar found in milk and milk products.  Following a lactose-free diet can help you manage this condition.  Calcium is important for bone health and is found in many foods that contain lactose. Talk with your health care provider about other sources of calcium. This information is not intended to replace advice given to you by your health care provider. Make sure you discuss any questions you have with your health care provider. Document Released: 05/26/2002 Document Revised: 01/01/2018 Document Reviewed: 01/01/2018 Elsevier Interactive Patient Education  2019 [employer].

## 2019-01-29 NOTE — Progress Notes (Signed)
Shawna Gonzalez    [id number]    22-Sep-1951  Primary Care Physician:Perini, Elta Guadeloupe, MD  Referring Physician: Crist Infante, [place], [place] [address]  Chief complaint: Belching, bloating, nausea, gerd, dyspepsia, diarrhea  HPI:  68 year old female with history of H. pylori gastritis status post Pylera therapy with eradication in 2017, chronic GERD with complaints of nausea, bloating and excessive belching worse in the past month. Lost weight about 65 lbs and has gained it back the past 2 years.  She is caring for her elderly mother, does not have time to exercise and is not watching her diet. Nexium once a day on most days but has to take it twice a day about 10 to 15 days in the month. Nausea but no vomiting.  No dysphagia, odynophagia, melena or blood per rectum.  She has excessive bloating and generalized abdominal discomfort. Intermittent diarrhea, worse with certain foods  Colonoscopy January 23, 2012 by Dr. Olevia Perches diverticulosis with removal of 2 hyperplastic polyps  Outpatient Encounter Medications as of 01/29/2019  Medication Sig  . albuterol (PROVENTIL HFA;VENTOLIN HFA) 108 (90 BASE) MCG/ACT inhaler Inhale 2 puffs into the lungs every 6 (six) hours as needed for wheezing or shortness of breath.  . bismuth-metronidazole-tetracycline (PYLERA) 140-125-125 MG capsule Take 3 capsules by mouth 4 (four) times daily -  before meals and at bedtime.  . budesonide-formoterol (SYMBICORT) 160-4.5 MCG/ACT inhaler Inhale 2 puffs into the lungs daily.  . dorzolamide (TRUSOPT) 2 % ophthalmic solution INSTILL 1 DROP INTO BOTH EYES THREE TIMES DAILY  . esomeprazole (NEXIUM) 40 MG capsule Take 40 mg by mouth every evening.   . fexofenadine (ALLEGRA) 180 MG tablet Take 180 mg by mouth every evening.   . furosemide (LASIX) 40 MG tablet Take 40 mg by mouth every evening.   Marland Kitchen ibuprofen (ADVIL,MOTRIN) 600 MG tablet Take 1 tablet (600 mg total) by mouth every 6 (six)  hours as needed (mild pain).  Marland Kitchen losartan (COZAAR) 50 MG tablet Take 50 mg by mouth every evening.   . Multiple Vitamins-Minerals (MULTIVITAMIN GUMMIES ADULT) CHEW Chew 2 tablets by mouth every evening.  . naproxen sodium (ANAPROX) 220 MG tablet Take 220 mg by mouth 2 (two) times daily as needed (for pain.).  Marland Kitchen omeprazole (PRILOSEC) 20 MG capsule Take 1 capsule (20 mg total) by mouth daily.  . potassium chloride SA (K-DUR,KLOR-CON) 20 MEQ tablet Take 20 mEq by mouth every Tuesday, Thursday, and Saturday at 6 PM.   . traMADol (ULTRAM) 50 MG tablet Take 1 tablet (50 mg total) by mouth every 6 (six) hours as needed for moderate pain.  . Travoprost, BAK Free, (TRAVATAN Z) 0.004 % SOLN ophthalmic solution PLACE 1 DROP INTO BOTH EYES NIGHTLY  . Vitamin D, Ergocalciferol, (DRISDOL) 50000 UNITS CAPS Take 50,000 Units by mouth 2 (two) times a week. Sunday & Thursday   No facility-administered encounter medications on file as of 01/29/2019.     Allergies as of 01/29/2019 - Review Complete 01/27/2019  Allergen Reaction Noted  . Amlodipine Cough 08/03/2015  . Bydureon [exenatide] Itching and Other (See Comments) 08/03/2015  . Lisinopril Cough 08/03/2015  . Lunesta [eszopiclone] Other (See Comments) 08/03/2015  . Sulfa antibiotics Rash 01/09/2012    Past Medical History:  Diagnosis Date  . Adenomatous colon polyp   . Anemia    history of  . Arthritis   . Asthma   . Atypical chest pain   . Bacterial ear  infection 2018  . Cancer ([place])    melanoma skin cancer 7 years ago  . Diabetes mellitus without complication ([place])    history of taking diabetic medication, has lost weight no longer needing medciation at this time  . GERD (gastroesophageal reflux disease)   . Glaucoma   . Heart murmur    slight  . Hypertension   . Macular degeneration   . Osteoporosis   . Sleep apnea   . Ulcerative colitis, chronic ([place])     Past Surgical History:  Procedure Laterality Date  . CARPAL TUNNEL RELEASE  Right 12/16/2009  . CATARACT EXTRACTION W/ INTRAOCULAR LENS  IMPLANT, BILATERAL Bilateral 05/21/2007(Right); 06/04/2007(Left)  . CHOLECYSTECTOMY  2000  . COLONOSCOPY WITH PROPOFOL  01/23/2012  . DE QUERVAIN'S RELEASE Right 11/30/2005  . HYSTERECTOMY ABDOMINAL WITH SALPINGECTOMY Bilateral 01/09/2017   Procedure: HYSTERECTOMY ABDOMINAL WITH SALPINGOOPHORECTOMY;  Surgeon: Dian Queen, MD;  Location: [facility];  Service: Gynecology;  Laterality: Bilateral;  . TONSILLECTOMY    . UPPER GI ENDOSCOPY  03/05/2017   MAC    Family History  Problem Relation Age of Onset  . Colon polyps Mother   . Heart disease Mother   . Colon polyps Father   . Colon polyps Brother     Social History   Socioeconomic History  . Marital status: Single    Spouse name: Not on file  . Number of children: 0  . Years of education: 58  . Highest education level: Not on file  Occupational History  . Occupation: Research scientist (physical sciences): [employer]  . Financial resource strain: Not on file  . Food insecurity:    Worry: Not on file    Inability: Not on file  . Transportation needs:    Medical: Not on file    Non-medical: Not on file  Tobacco Use  . Smoking status: Never Smoker  . Smokeless tobacco: Never Used  Substance and Sexual Activity  . Alcohol use: No  . Drug use: No  . Sexual activity: Not on file  Lifestyle  . Physical activity:    Days per week: Not on file    Minutes per session: Not on file  . Stress: Not on file  Relationships  . Social connections:    Talks on phone: Not on file    Gets together: Not on file    Attends religious service: Not on file    Active member of club or organization: Not on file    Attends meetings of clubs or organizations: Not on file    Relationship status: Not on file  . Intimate partner violence:    Fear of current or ex partner: Not on file    Emotionally abused: Not on file    Physically abused: Not on file    Forced sexual activity:  Not on file  Other Topics Concern  . Not on file  Social History Narrative   Lives at home with mother.   Caffeine use: Diet and caffeine free soda (8 drinks per day)         Epworth Sleepiness Scale = 7 (as of 01/26/2016)      Review of systems: Review of Systems  Constitutional: Negative for fever and chills.  Positive for lack of energy HENT: Positive for sinus problem Eyes: Negative for blurred vision.  Respiratory: Negative for cough, shortness of breath and wheezing.   Cardiovascular: Negative for chest pain and palpitations.  Gastrointestinal: as per HPI Genitourinary: Negative for  dysuria, urgency, frequency and hematuria.  Musculoskeletal: Positive for myalgias, back pain and joint pain.  Skin: Negative for itching and rash.  Neurological: Negative for dizziness, tremors, focal weakness, seizures and loss of consciousness.  Endo/Heme/Allergies: Positive for seasonal allergies.  Psychiatric/Behavioral: Negative for depression, suicidal ideas and hallucinations.  All other systems reviewed and are negative.   Physical Exam: Vitals:   01/29/19 1326  BP: (!) 144/98  Pulse: 78   Body mass index is 41.59 kg/m. Gen:      No acute distress HEENT:  EOMI, sclera anicteric Neck:     No masses; no thyromegaly Lungs:    Clear to auscultation bilaterally; normal respiratory effort CV:         Regular rate and rhythm; no murmurs Abd:      + bowel sounds; soft, non-tender; no palpable masses, no distension Ext:    No edema; adequate peripheral perfusion Skin:      Warm and dry; no rash Neuro: alert and oriented x 3 Psych: normal mood and affect  Data Reviewed:  Reviewed labs, radiology imaging, old records and pertinent past GI work up   Assessment and Plan/Recommendations:  68 year old female with history of chronic GERD, H. pylori gastritis status post eradication 2017 here with complaints of intermittent heartburn, excessive belching, bloating, dyspepsia and  intermittent diarrhea Her symptoms are likely secondary to uncontrolled GERD Switch to Dexilant 60 mg daily Discussed antireflux measures and lifestyle modifications  Check H. pylori stool antigen to exclude reinfection  Trial of FD guard 1 capsule 3 times daily for dyspepsia symptoms  Intermittent diarrhea possibly secondary to lactose intolerance: We will do a trial of lactose-free diet for 1 to 2 weeks  Return in 2 months or sooner if needed  25 minutes was spent face-to-face with the patient. Greater than 50% of the time used for counseling as well as treatment plan and follow-up. She had multiple questions which were answered to her satisfaction  K. Denzil Magnuson , MD [phone]    CC: Crist Infante, MD

## 2019-01-30 ENCOUNTER — Other Ambulatory Visit: Payer: 59

## 2019-01-30 DIAGNOSIS — R142 Eructation: Secondary | ICD-10-CM

## 2019-01-30 DIAGNOSIS — K219 Gastro-esophageal reflux disease without esophagitis: Secondary | ICD-10-CM

## 2019-01-30 DIAGNOSIS — R110 Nausea: Secondary | ICD-10-CM

## 2019-01-30 DIAGNOSIS — R1084 Generalized abdominal pain: Secondary | ICD-10-CM

## 2019-01-30 DIAGNOSIS — R140 Abdominal distension (gaseous): Secondary | ICD-10-CM

## 2019-01-31 ENCOUNTER — Encounter: Payer: Self-pay | Admitting: Gastroenterology

## 2019-01-31 LAB — HELICOBACTER PYLORI  SPECIAL ANTIGEN
MICRO NUMBER:: 191818
SPECIMEN QUALITY: ADEQUATE

## 2019-03-12 DIAGNOSIS — M7502 Adhesive capsulitis of left shoulder: Secondary | ICD-10-CM | POA: Insufficient documentation

## 2019-03-14 ENCOUNTER — Telehealth: Payer: Self-pay | Admitting: *Deleted

## 2019-03-14 NOTE — Telephone Encounter (Signed)
Patient called and cancelled appointment.

## 2019-03-17 ENCOUNTER — Ambulatory Visit: Payer: 59 | Admitting: Gastroenterology

## 2019-04-01 DIAGNOSIS — M25552 Pain in left hip: Secondary | ICD-10-CM | POA: Insufficient documentation

## 2019-04-02 DIAGNOSIS — M7062 Trochanteric bursitis, left hip: Secondary | ICD-10-CM | POA: Insufficient documentation

## 2019-05-05 DIAGNOSIS — M47817 Spondylosis without myelopathy or radiculopathy, lumbosacral region: Secondary | ICD-10-CM | POA: Insufficient documentation

## 2019-05-19 DIAGNOSIS — M5126 Other intervertebral disc displacement, lumbar region: Secondary | ICD-10-CM | POA: Insufficient documentation

## 2020-01-29 ENCOUNTER — Encounter: Payer: Self-pay | Admitting: Neurology

## 2020-01-29 ENCOUNTER — Ambulatory Visit: Payer: Medicare Other | Admitting: Neurology

## 2020-01-29 ENCOUNTER — Other Ambulatory Visit: Payer: Self-pay

## 2020-01-29 VITALS — BP 145/85 | HR 91 | Temp 97.5°F | Ht <= 58 in | Wt 199.0 lb

## 2020-01-29 DIAGNOSIS — E662 Morbid (severe) obesity with alveolar hypoventilation: Secondary | ICD-10-CM

## 2020-01-29 DIAGNOSIS — G4733 Obstructive sleep apnea (adult) (pediatric): Secondary | ICD-10-CM | POA: Diagnosis not present

## 2020-01-29 DIAGNOSIS — J9612 Chronic respiratory failure with hypercapnia: Secondary | ICD-10-CM

## 2020-01-29 DIAGNOSIS — Z9989 Dependence on other enabling machines and devices: Secondary | ICD-10-CM

## 2020-01-29 DIAGNOSIS — J9611 Chronic respiratory failure with hypoxia: Secondary | ICD-10-CM

## 2020-01-29 DIAGNOSIS — Z862 Personal history of diseases of the blood and blood-forming organs and certain disorders involving the immune mechanism: Secondary | ICD-10-CM | POA: Diagnosis not present

## 2020-01-29 DIAGNOSIS — J4540 Moderate persistent asthma, uncomplicated: Secondary | ICD-10-CM

## 2020-01-29 NOTE — Patient Instructions (Signed)

## 2020-01-29 NOTE — Progress Notes (Signed)
SLEEP MEDICINE CLINIC   Provider:  Larey Seat, M D  Referring Provider: Crist Infante, MD Primary Care Physician:  Crist Infante, MD  Chief Complaint  Patient presents with  . Follow-up    pt alone, rm 11. pt states that she struggles with her sleep. DME [facility]. machine works fine   01-29-2020: RV with  Berenice Primas who is a 69 y.o. 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 she has received 2 shots of the [employer] vaccine. Mother had pneumonia, non- covid - and was cared for at home.  She has maintained high BMI , and has used her CPAP compliantly. She remain fatigued.  She gets about 3.5- 7 hours at night, not enough. She can't nap in daytime - her mother still smokes, and drops ashes all over the house, has trouble to use a lighter.  She fell last Saturday.  The caretaking has been a 24/ 7 job- and her brother is not helping much.   HPI:  She has high BP today, is not short of breath, and she reports no pain at this time.  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  She had hoped to spend her retirement with different activities traveling etc.seen here as a referral  from Dr. Joylene Draft for sleep hypoxemia, question if sleep apnea is presented.  Chief complaint according to patient : I had a CPAP in the past but it didn't work well for me "I'm using oxygen at night but I have to wake up 4-5 times to go to the bathroom interrupting my sleep."  Sleep habits are as follows: Mrs. Burdo reports that she often falls asleep in a chair in the living room rather than in her own bed, and that she feels drowsy or excessively daytime sleepy several times a day. She also reports a very fragmented sleep wake pattern. She often wakes up from sleep because of nocturia and her bathroom breaks continue in daytime. She may go to sleep inadvertently in the recliner at about 8 or 9 PM and then transferred  hour later to the bedroom proper. She may there fall asleep for an hour to 2 hours , her sleep is fragmented and these little portions. Sometimes she is too tired to sleepy to transfer from the recliner to the bedroom when she will remain all night in the recliner. In the morning she tries to rise at about 6:30 AM she prepares breakfast for family, and by 7:15 she has to leave for her work. 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 She does not have a shift work history. Mrs. Galgano's medical history is as follows. She has some morbid obesity, diabetes mellitus type 2, osteoporosis, sleep related hypoxemia with partially treated obstructive sleep apnea or untreated obstructive sleep apnea. 7 years ago she was diagnosed with a melanoma. She had a vitamin D deficiency, allergic rhinitis, and congenital nystagmus. Her family history is positive for arthritis, diabetes, hypertension stroke and CVA. Sleep medical history and family sleep history: she was diagnosed with OSA in 2009, but could not tolerate CPAP. Social history:  The patient lives with her mother she is single and has no children, she does not drink alcohol she does not smoke she does not use recreational drugs.   01-18-16 Mrs. Stankovic was just last week treated with an IV infusion for a severe asthma attack.  She has asthma related sleep hypoxemia she has obstructive sleep apnea and underwent CPAP titration on 10/18/2015. She has continued to use on CPAP between 10 and 15 cm water with 37 m EPR and was a prolonged ramp time. Her download data obtained in the office look excellent. 97% compliance 6 hours and 5 minutes of daily use and a residual AHI of only 1.5. She does not have oxygen at home. She is using multiple medications and rescue inhalers for her underlying condition of asthma and hypoventilation. Her asthma exacerbated with allergies.  She is often sleeping in her recliner watching TV, and goes from  there to the bed, continuing to sleep.   Interval history from 2-1- 2018 = pleasure of seeing Mrs. Oakland was medical history includes now a recent hysterectomy. She has been more sleep deprived over the last 10 days or so and during her hospitalization could not bring her CPAP with her. Her compliance is accordingly reduced the hospital did not provide a CPAP. Her average user time is now reduced to 3 hours and 6 minutes but related to medical reasons. She is using an AutoSet between 10 and 15 cm water with 3 cm EPR and her 95th percentile pressure is at 12.9 cm, her residual apnea index is 1.4 which is a great resolution. There are no major air leaks noted. Her Epworth sleepiness score is reduced to 4 points from 13 prior to CPAP her fatigue severity is 9 points only and her depression score is endorsed at 2 out of 15 possible points.  I have the pleasure of seeing Mrs. Reposa today on 24 January 2018 and I scheduled revisit. She has as usual done exemplary well with the use of her CPAP 87%compliance.  Average use of time 5 hours 52 minutes, she is using an AutoSet between 10 and 15 cm water pressure with 3 cm EPR, her residual AHI is only 0.5 apneas per hour.  She still has air leaks but what is more concerning is that she reports she wakes up so frequently that the sleep would be considered heavily fragmented.  Often she doubts that she even gets 5 hours of sleep . She denies nocturia but has a lot of hip pain and discomfort. In December 2018 she received a shot of cortisone in her knee which has helped somewhat with the knee pain, however the hip pain is now most dominant. She  endorsed the Epworth sleepiness score at 0 points, fatigue severity at 14 points, she does not take daytime naps.  The geriatric depression score was endorsed at 1 out of 15 points.   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   Review of Systems: hip. Knee and back pain. Obesity. SOB,  wheezing. Hearing loss.  Out of a complete 14 system review, the patient complains of only the following symptoms, and all other reviewed systems are negative. Weight gain,  How likely are you to doze in the following situations: 0 = not likely, 1 = slight chance, 2 = moderate chance, 3 = high chance  Sitting and Reading? Watching Television? Sitting inactive in a public place (theater or meeting)? Lying down in the afternoon when circumstances permit? Sitting and talking to someone? Sitting quietly after lunch without alcohol? In a car, while stopped for a few minutes in traffic? As a passenger in a car for an hour without a break?  Total = 0   Epworth sleepiness score  Remained 0 from  13 points pre- CPAP ,  Fatigue severity score was 45 points before CPAP , now at 33 points. GDS /geriatric depression score 3/ 15  points.    Social History   Socioeconomic History  . Marital status: Single    Spouse name: Not on file  . Number of children: 0  . Years of education: 24  . Highest education level: Not on file  Occupational History  . Occupation: customer service    Employer: [employer]  Tobacco Use  . Smoking status: Never Smoker  . Smokeless tobacco: Never Used  Substance and Sexual Activity  . Alcohol use: No  . Drug use: No  . Sexual activity: Not on file  Other Topics Concern  . Not on file  Social History Narrative   Lives at home with mother.   Caffeine use: Diet and caffeine free soda (8 drinks per day)         Epworth Sleepiness Scale = 7 (as of 01/26/2016)   Social Determinants of Health   Financial Resource Strain:   . Difficulty of Paying Living Expenses: Not on file  Food Insecurity:   . Worried About Charity fundraiser in the Last Year: Not on file  . Ran Out of Food in the Last Year: Not on file  Transportation Needs:   . Lack of Transportation (Medical): Not on file  . Lack of Transportation (Non-Medical): Not on file  Physical Activity:   . Days  of Exercise per Week: Not on file  . Minutes of Exercise per Session: Not on file  Stress:   . Feeling of Stress : Not on file  Social Connections:   . Frequency of Communication with Friends and Family: Not on file  . Frequency of Social Gatherings with Friends and Family: Not on file  . Attends Religious Services: Not on file  . Active Member of Clubs or Organizations: Not on file  . Attends Archivist Meetings: Not on file  . Marital Status: Not on file  Intimate Partner Violence:   . Fear of Current or Ex-Partner: Not on file  . Emotionally Abused: Not on file  . Physically Abused: Not on file  . Sexually Abused: Not on file    Family History  Problem Relation Age of Onset  . Colon polyps Mother   . Heart disease Mother   . Colon polyps Father   . Colon polyps Brother     Past Medical History:  Diagnosis Date  . Adenomatous colon polyp   . Anemia    history of  . Arthritis   . Asthma   . Atypical chest pain   . Bacterial ear infection 2018  . Cancer ([place])    melanoma skin cancer 7 years ago  . Diabetes mellitus without complication ([place])    history of taking diabetic medication, has lost weight no longer needing medciation at this time  . GERD (gastroesophageal reflux disease)   . Glaucoma   . Heart murmur    slight  . Hypertension   . Macular degeneration   . Osteoporosis   . Sleep apnea   . Ulcerative colitis, chronic ([place])     Past Surgical History:  Procedure Laterality Date  . CARPAL TUNNEL RELEASE Right 12/16/2009  . CATARACT EXTRACTION W/ INTRAOCULAR LENS  IMPLANT, BILATERAL Bilateral 05/21/2007(Right); 06/04/2007(Left)  . CHOLECYSTECTOMY  2000  . COLONOSCOPY WITH PROPOFOL  01/23/2012  . DE QUERVAIN'S RELEASE Right 11/30/2005  . HYSTERECTOMY ABDOMINAL WITH SALPINGECTOMY Bilateral 01/09/2017  Procedure: HYSTERECTOMY ABDOMINAL WITH SALPINGOOPHORECTOMY;  Surgeon: Dian Queen, MD;  Location: [facility];  Service: Gynecology;  Laterality:  Bilateral;  . TONSILLECTOMY    . UPPER GI ENDOSCOPY  03/05/2017   MAC    Current Outpatient Medications  Medication Sig Dispense Refill  . albuterol (PROVENTIL HFA;VENTOLIN HFA) 108 (90 BASE) MCG/ACT inhaler Inhale 2 puffs into the lungs every 6 (six) hours as needed for wheezing or shortness of breath.    . bismuth-metronidazole-tetracycline (PYLERA) 140-125-125 MG capsule Take 3 capsules by mouth 4 (four) times daily -  before meals and at bedtime. 168 capsule 0  . budesonide-formoterol (SYMBICORT) 160-4.5 MCG/ACT inhaler Inhale 2 puffs into the lungs daily.    Marland Kitchen dexlansoprazole (DEXILANT) 60 MG capsule Take 1 capsule (60 mg total) by mouth daily. 30 capsule 3  . dorzolamide (TRUSOPT) 2 % ophthalmic solution INSTILL 1 DROP INTO BOTH EYES THREE TIMES DAILY 30 mL 0  . fexofenadine (ALLEGRA) 180 MG tablet Take 180 mg by mouth every evening.     . furosemide (LASIX) 40 MG tablet Take 40 mg by mouth every evening.   4  . ibuprofen (ADVIL,MOTRIN) 600 MG tablet Take 1 tablet (600 mg total) by mouth every 6 (six) hours as needed (mild pain). 60 tablet 0  . losartan (COZAAR) 50 MG tablet Take 50 mg by mouth every evening.     . Multiple Vitamins-Minerals (MULTIVITAMIN GUMMIES ADULT) CHEW Chew 2 tablets by mouth every evening.    . naproxen sodium (ANAPROX) 220 MG tablet Take 220 mg by mouth 2 (two) times daily as needed (for pain.).    Marland Kitchen omeprazole (PRILOSEC) 20 MG capsule Take 1 capsule (20 mg total) by mouth daily. 28 capsule 0  . potassium chloride SA (K-DUR,KLOR-CON) 20 MEQ tablet Take 20 mEq by mouth every Tuesday, Thursday, and Saturday at 6 PM.     . traMADol (ULTRAM) 50 MG tablet Take 1 tablet (50 mg total) by mouth every 6 (six) hours as needed for moderate pain. 30 tablet 0  . Travoprost, BAK Free, (TRAVATAN Z) 0.004 % SOLN ophthalmic solution PLACE 1 DROP INTO BOTH EYES NIGHTLY    . Vitamin D, Ergocalciferol, (DRISDOL) 50000 UNITS CAPS Take 50,000 Units by mouth 2 (two) times a week.  Sunday & Thursday     No current facility-administered medications for this visit.    Allergies as of 01/29/2020 - Review Complete 01/29/2020  Allergen Reaction Noted  . Amlodipine Cough 08/03/2015  . Bydureon [exenatide] Itching and Other (See Comments) 08/03/2015  . Lisinopril Cough 08/03/2015  . Lunesta [eszopiclone] Other (See Comments) 08/03/2015  . Sulfa antibiotics Rash 01/09/2012    Vitals: BP (!) 145/85   Pulse 91   Temp (!) 97.5 F (36.4 C)   Ht _0  (1.473 m)   Wt 199 lb (90.3 kg)   BMI 41.59 kg/m  Last Weight:  Wt Readings from Last 1 Encounters:  01/29/20 199 lb (90.3 kg)   EXB:MWUX mass index is 41.59 kg/m.     Last Height:   Ht Readings from Last 1 Encounters:  01/29/20 _1  (1.473 m)    Review of Systems: the patient has a primary vision loss, baseline strabismus, and she has had signs of obesity hypoventilation in the past.  Blood pressure was found to be elevated today but she is not in pain, she is however under stress.   Epworth sleepiness score was endorsed at 1 point fatigue severity at 23 points, depression score was only endorsed at  1 out of 15 points.  She is a compliant CPAP user over the last 30 days has used the machine every day and every day over 4 hours with an average use at time of 7 hours 12 minutes, she is using an AutoSet between 10 and 15 cm water pressure with 3 cm EPR, her AHI is 0.7 the 95th percentile pressure is 13.7 cmH2O she does have high air leaks from time to time.  This seems to be a trend since late January and she had highest air leaks in the first week of February.  Physical exam:  General: The patient is awake, alert and appears not in acute distress. The patient is well groomed. Head: Normocephalic, atraumatic. Neck is supple. Mallampati 4 ,  neck circumference: 18.5 . Nasal airflow restricted, TMJ is not  evident . Retrognathia is seen.  Cardiovascular:  Regular rate and rhythm , without  murmurs or carotid bruit, and  without distended neck veins. Respiratory: wheezing, SOB , baseline RR 18. Skin:  Ankle  edema, or rash Trunk: BMI was down to 37 from 50 at the time of PSG.  Now back up to 41 kg/m2 . The patient's abdomen is most affected   Neurologic exam : The patient is awake and alert, oriented to place and time.   Speech is fluent,  without  dysarthria, dysphonia or aphasia.  She is not longer short of breath,  Mood and affect are irritated.   Cranial nerves: taste and smell reportedly intact. Pupils are equal and briskly reactive to light. Baseline strabism.  Primary nystagmus is still present. She has non -conjugate eye movements. Macular degeneration. Wet type. Visual fields by finger perimetry impaired-  not scotoma, but peripheral vision changes, tunnel vision. Legally blind.   Facial motor strength is symmetric and tongue and uvula move midline. Shoulder shrug was symmetrical.     Assessment:  After physical and neurologic examination, review of laboratory studies,  Personal review of imaging studies, reports of other /same  Imaging studies ,  Results of polysomnography/ neurophysiology testing and pre-existing records as far as provided in visit., my assessment is    1) Asthma and OSA overlapping syndrome.  Obesity : She also had signs of obesity hypoventilation, She  has done very well with CPAP which has also made oxygen supplementation obsolete. Reported chronic sleep related hypoxemia is treated by CPAP, in an  asthmatic and morbidly obese patient.   2)Highly compliant CPAP patient.  The patient remains a highly compliant 100% compliant CPAP patient by time and days.  Average user time is 6 hours 41 minutes this data collection ended on 26 January 2020.  She is using an air sense AutoSet between a minimum pressure of 10 and a maximum of 15 cmH2O with 3 cm expiratory pressure relief, her AHI is 0.9 excellent resolution.  She does have high air leakage the 95th percentile pressure is 14.3.  In  relation to the recent regaining of weight I would like to increase the maximum pressure to 17.     I also encouraged the patient to consider calling back to the weight and wellness center but her mother's physical decline has made it impossible for her to keep some of these appointments. She never made the appointment when the center called.   Her obesity is truncal affecting her diaphragmatic strength and her chest wall movements. Continue CPAP, try to lose weight.  Consider medical weight loss clinic. I would be happy to re-refer her to Dr.  Leafy Ro. Plan: RV with NP in 12 month.     Asencion Partridge Ranelle Auker MD  01/29/2020   CC: Crist Infante, [address] [place],  [place] [address]

## 2020-02-04 ENCOUNTER — Encounter: Payer: Self-pay | Admitting: Neurology

## 2020-02-09 ENCOUNTER — Telehealth: Payer: Self-pay | Admitting: Neurology

## 2020-02-09 NOTE — Telephone Encounter (Signed)
Received ONO results from [facility] on the patient. She completed the study on her CPAP. I will send to Dr Brett Fairy to review and then contact the patient with result after she reviews.

## 2020-02-13 ENCOUNTER — Telehealth: Payer: Self-pay | Admitting: Neurology

## 2020-02-13 DIAGNOSIS — Z862 Personal history of diseases of the blood and blood-forming organs and certain disorders involving the immune mechanism: Secondary | ICD-10-CM

## 2020-02-13 DIAGNOSIS — R05 Cough: Secondary | ICD-10-CM

## 2020-02-13 DIAGNOSIS — Z9989 Dependence on other enabling machines and devices: Secondary | ICD-10-CM

## 2020-02-13 DIAGNOSIS — J9611 Chronic respiratory failure with hypoxia: Secondary | ICD-10-CM

## 2020-02-13 DIAGNOSIS — G4733 Obstructive sleep apnea (adult) (pediatric): Secondary | ICD-10-CM

## 2020-02-13 DIAGNOSIS — R0902 Hypoxemia: Secondary | ICD-10-CM

## 2020-02-13 DIAGNOSIS — R053 Chronic cough: Secondary | ICD-10-CM

## 2020-02-13 NOTE — Telephone Encounter (Signed)
Dear [name]/ cc Shawna Gonzalez  Overnight pulse-oximetry was ordered for this CPAP patient while on CPAP 17 cm water pressure and on ROOM AIR.  Review of the study from 02-04-2020 through 02-05-2020 documented 7 h 17 min of recording time with 26 minutes at or below 89% SpO2.  These desaturation times clustered between 2 and 3 AM and are likely reflecting REM sleep.   The patient is qualified by medicare guidelines to receive oxygen , but we would have to proof in an attended sleep study that the desaturation is not due to a device defect or artefact.   Our option is to invite the patient to sleep in the lab, attended (on CPAP at her 17 cm water pressure setting), and titrate oxygen according to the recorded desaturation. I placed an order:  "Persistent hypoxia on CPAP- ONO abnormal. Please confirm hypoxia , and titrate to oxygen while on CPAP. "   Myriam Jacobson:  I called the listed phone but did not reach her. If she is able to come in for oxygen titration, on CPAP, please activate the sleep study order. 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  Larey Seat, MD   Cc FYI Crist Infante, MD

## 2020-02-16 NOTE — Telephone Encounter (Signed)
Pt called and left a VM returning MDs call- requesting a call back

## 2020-02-17 NOTE — Telephone Encounter (Signed)
Pt returned call and reviewed her ONO results with her. Advised that it would indicate oxygen would be needed. Advised the patient of the process and that would require CPAP titration study to address the oxygen concerns as well. She verbalized understanding. Patient made aware to be looking out for a call from the sleep lab

## 2020-02-24 ENCOUNTER — Telehealth: Payer: Self-pay

## 2020-02-24 NOTE — Telephone Encounter (Signed)
Called patient to schedule CPAP and O2 titration sleep study. Pt is not interested in scheduling sleep study at this time. 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 She wants to wait to schedule at this time. She will call the sleep lab back when ready to schedule.

## 2020-04-13 ENCOUNTER — Ambulatory Visit: Payer: Medicare Other | Admitting: Gastroenterology

## 2020-04-13 ENCOUNTER — Encounter: Payer: Self-pay | Admitting: Gastroenterology

## 2020-04-13 ENCOUNTER — Other Ambulatory Visit (INDEPENDENT_AMBULATORY_CARE_PROVIDER_SITE_OTHER): Payer: Medicare Other

## 2020-04-13 VITALS — BP 138/70 | HR 78 | Temp 97.8°F | Ht 59.0 in | Wt 199.0 lb

## 2020-04-13 DIAGNOSIS — R140 Abdominal distension (gaseous): Secondary | ICD-10-CM | POA: Diagnosis not present

## 2020-04-13 DIAGNOSIS — R1084 Generalized abdominal pain: Secondary | ICD-10-CM

## 2020-04-13 LAB — CBC WITH DIFFERENTIAL/PLATELET
Basophils Absolute: 0.1 10*3/uL (ref 0.0–0.1)
Basophils Relative: 0.8 % (ref 0.0–3.0)
Eosinophils Absolute: 0.1 10*3/uL (ref 0.0–0.7)
Eosinophils Relative: 2 % (ref 0.0–5.0)
HCT: 40.7 % (ref 36.0–46.0)
Hemoglobin: 13.6 g/dL (ref 12.0–15.0)
Lymphocytes Relative: 31.3 % (ref 12.0–46.0)
Lymphs Abs: 2.1 10*3/uL (ref 0.7–4.0)
MCHC: 33.5 g/dL (ref 30.0–36.0)
MCV: 81.2 fl (ref 78.0–100.0)
Monocytes Absolute: 0.6 10*3/uL (ref 0.1–1.0)
Monocytes Relative: 8.4 % (ref 3.0–12.0)
Neutro Abs: 3.8 10*3/uL (ref 1.4–7.7)
Neutrophils Relative %: 57.5 % (ref 43.0–77.0)
Platelets: 255 10*3/uL (ref 150.0–400.0)
RBC: 5.01 Mil/uL (ref 3.87–5.11)
RDW: 15.1 % (ref 11.5–15.5)
WBC: 6.7 10*3/uL (ref 4.0–10.5)

## 2020-04-13 LAB — COMPREHENSIVE METABOLIC PANEL
ALT: 19 U/L (ref 0–35)
AST: 16 U/L (ref 0–37)
Albumin: 4 g/dL (ref 3.5–5.2)
Alkaline Phosphatase: 96 U/L (ref 39–117)
BUN: 11 mg/dL (ref 6–23)
CO2: 30 mEq/L (ref 19–32)
Calcium: 8.9 mg/dL (ref 8.4–10.5)
Chloride: 100 mEq/L (ref 96–112)
Creatinine, Ser: 0.63 mg/dL (ref 0.40–1.20)
GFR: 93.9 mL/min (ref >60.00)
Glucose, Bld: 98 mg/dL (ref 70–99)
Potassium: 3.3 mEq/L — ABNORMAL LOW (ref 3.5–5.1)
Sodium: 138 mEq/L (ref 135–145)
Total Bilirubin: 0.5 mg/dL (ref 0.2–1.2)
Total Protein: 7.4 g/dL (ref 6.0–8.3)

## 2020-04-13 LAB — IGA: IgA: 255 mg/dL (ref 68–378)

## 2020-04-13 NOTE — Patient Instructions (Signed)
If you are age 68 or older, your body mass index should be between 23-30. Your Body mass index is 40.19 kg/m. If this is out of the aforementioned range listed, please consider follow up with your Primary Care Provider.  If you are age 5 or younger, your body mass index should be between 19-25. Your Body mass index is 40.19 kg/m. If this is out of the aformentioned range listed, please consider follow up with your Primary Care Provider.   Your provider has requested that you go to the basement level for lab work before leaving today. Press "B" on the elevator. The lab is located at the first door on the left as you exit the elevator.  You have been scheduled for a CT scan of the abdomen and pelvis at [facility] ([address]---this is in the same building as [employer]).   You are scheduled on Tuesday 04/20/20 at 2:30 pm. You should arrive 15 minutes prior to your appointment time for registration. Please follow the written instructions below on the day of your exam:  WARNING: IF YOU ARE ALLERGIC TO IODINE/X-RAY DYE, PLEASE NOTIFY RADIOLOGY IMMEDIATELY AT [phone]! YOU WILL BE GIVEN A 13 HOUR PREMEDICATION PREP.  1) Do not eat or drink anything after 10:30 am (4 hours prior to your test) 2) You have been given 2 bottles of oral contrast to drink. The solution may taste better if refrigerated, but do NOT add ice or any other liquid to this solution. Shake well before drinking.    Drink 1 bottle of contrast @ 12:30 pm (2 hours prior to your exam)  Drink 1 bottle of contrast @ 1:30 pm (1 hour prior to your exam)  You may take any medications as prescribed with a small amount of water, if necessary. If you take any of the following medications: METFORMIN, GLUCOPHAGE, GLUCOVANCE, AVANDAMET, RIOMET, FORTAMET, [place] MET, JANUMET, GLUMETZA or METAGLIP, you MAY be asked to HOLD this medication 48 hours AFTER the exam.  The purpose of you drinking the oral contrast is  to aid in the visualization of your intestinal tract. The contrast solution may cause some diarrhea. Depending on your individual set of symptoms, you may also receive an intravenous injection of x-ray contrast/dye. Plan on being at [facility] for 30 minutes or longer, depending on the type of exam you are having performed.  This test typically takes 30-45 minutes to complete.  If you have any questions regarding your exam or if you need to reschedule, you may call the CT department at [phone] between the hours of 8:00 am and 5:00 pm, Monday-Friday.  ________________________________________________________________

## 2020-04-13 NOTE — Progress Notes (Signed)
04/13/2020 Shawna Gonzalez [id number] [birth year]   HISTORY OF PRESENT ILLNESS: This is a 69 year old female who was previously patient of Dr. Olevia Perches and her care has since been assumed by Dr. Silverio Decamp.  Her last colonoscopy was in February 2013 at which time she was found to have 2 polyps that were removed were hyperplastic on pathology.  She is also noted to have diverticulosis.  She has presented here on several occasions with very vague abdominal complaints, but each time the complaints have been similar.  She describes diffuse/generalized upper abdominal pain with associated nausea, bloating, belching.  She is on omeprazole 20 mg twice daily.  Her acid reflux medicines have been changed over the years as she had was on Nexium and Zantac previously.  She was given samples of Dexilant, which may have worked somewhat better for her acid reflux than other medications, but was not affordable.  She does not think that lactose-free diet has helped her symptoms in the past.  She tried FD guard without much improvement in her symptoms.  Last CT scan of the abdomen/pelvis was November 2017 at which time she was found to have an adnexal mass suspected to be a pedunculated fibroid.  She then a few months later underwent hysterectomy with salpingo-oophorectomy by Dr. Helane [name].  Pathology was benign.    Last EGD was in March 2018 with LA grade B esophagitis and gastritis.  Gastric biopsies were consistent with H. pylori.  She completed that treatment and has now had two negative H. pylori stool antigens, May 2018 and February 2020.  Nonetheless, she returns here today with similar symptoms.  Symptoms do not affect her sleeping ability.  Reports that her symptoms seem to be worse over the past 2 weeks or so.  As a rule her stools tend to be loose.   Past Medical History:  Diagnosis Date  . Adenomatous colon polyp   . Anemia    history of  . Arthritis   . Asthma   . Atypical chest pain   .  Bacterial ear infection 2018  . Cancer ([place])    melanoma skin cancer 7 years ago  . Diabetes mellitus without complication ([place])    history of taking diabetic medication, has lost weight no longer needing medciation at this time  . GERD (gastroesophageal reflux disease)   . Glaucoma   . Heart murmur    slight  . Hypertension   . Macular degeneration   . Osteoporosis   . Sleep apnea   . Ulcerative colitis, chronic ([place])    Past Surgical History:  Procedure Laterality Date  . CARPAL TUNNEL RELEASE Right 12/16/2009  . CATARACT EXTRACTION W/ INTRAOCULAR LENS  IMPLANT, BILATERAL Bilateral 05/21/2007(Right); 06/04/2007(Left)  . CHOLECYSTECTOMY  2000  . COLONOSCOPY WITH PROPOFOL  01/23/2012  . DE QUERVAIN'S RELEASE Right 11/30/2005  . HYSTERECTOMY ABDOMINAL WITH SALPINGECTOMY Bilateral 01/09/2017   Procedure: HYSTERECTOMY ABDOMINAL WITH SALPINGOOPHORECTOMY;  Surgeon: Dian Queen, MD;  Location: [facility];  Service: Gynecology;  Laterality: Bilateral;  . TONSILLECTOMY    . UPPER GI ENDOSCOPY  03/05/2017   MAC    reports that she has never smoked. She has never used smokeless tobacco. She reports that she does not drink alcohol or use drugs. family history includes Colon polyps in her brother, father, and mother; Heart disease in her mother. Allergies  Allergen Reactions  . Amlodipine Cough  . Bydureon [Exenatide] Itching and Other (See Comments)    KNOTS IN SKIN  . Lisinopril  Cough  . Lunesta [Eszopiclone] Other (See Comments)    Metallic taste in mouth  . Sulfa Antibiotics Rash      Outpatient Encounter Medications as of 04/13/2020  Medication Sig  . albuterol (PROVENTIL HFA;VENTOLIN HFA) 108 (90 BASE) MCG/ACT inhaler Inhale 2 puffs into the lungs every 6 (six) hours as needed for wheezing or shortness of breath.  . budesonide-formoterol (SYMBICORT) 160-4.5 MCG/ACT inhaler Inhale 2 puffs into the lungs daily.  . dorzolamide (TRUSOPT) 2 % ophthalmic solution INSTILL 1 DROP INTO BOTH  EYES THREE TIMES DAILY  . furosemide (LASIX) 40 MG tablet Take 40 mg by mouth every evening.   Marland Kitchen ibuprofen (ADVIL,MOTRIN) 600 MG tablet Take 1 tablet (600 mg total) by mouth every 6 (six) hours as needed (mild pain).  Marland Kitchen levocetirizine (XYZAL) 5 MG tablet Take 5 mg by mouth every evening.  Marland Kitchen losartan (COZAAR) 50 MG tablet Take 50 mg by mouth every evening.   . Multiple Vitamins-Minerals (MULTIVITAMIN GUMMIES ADULT) CHEW Chew 2 tablets by mouth every evening.  Marland Kitchen omeprazole (PRILOSEC) 20 MG capsule Take 1 capsule (20 mg total) by mouth daily.  . potassium chloride SA (K-DUR,KLOR-CON) 20 MEQ tablet Take 20 mEq by mouth every Tuesday, Thursday, and Saturday at 6 PM.   . traMADol (ULTRAM) 50 MG tablet Take 1 tablet (50 mg total) by mouth every 6 (six) hours as needed for moderate pain.  . Travoprost, BAK Free, (TRAVATAN Z) 0.004 % SOLN ophthalmic solution PLACE 1 DROP INTO BOTH EYES NIGHTLY  . Vitamin D, Ergocalciferol, (DRISDOL) 50000 UNITS CAPS Take 50,000 Units by mouth 2 (two) times a week. Sunday & Thursday  . [DISCONTINUED] bismuth-metronidazole-tetracycline (PYLERA) 140-125-125 MG capsule Take 3 capsules by mouth 4 (four) times daily -  before meals and at bedtime.  . [DISCONTINUED] dexlansoprazole (DEXILANT) 60 MG capsule Take 1 capsule (60 mg total) by mouth daily.  . [DISCONTINUED] fexofenadine (ALLEGRA) 180 MG tablet Take 180 mg by mouth every evening.   . [DISCONTINUED] naproxen sodium (ANAPROX) 220 MG tablet Take 220 mg by mouth 2 (two) times daily as needed (for pain.).   No facility-administered encounter medications on file as of 04/13/2020.     REVIEW OF SYSTEMS  : All other systems reviewed and negative except where noted in the History of Present Illness.   PHYSICAL EXAM: BP 138/70   Pulse 78   Temp 97.8 F (36.6 C)   Ht _0  (1.499 m)   Wt 199 lb (90.3 kg)   SpO2 98%   BMI 40.19 kg/m  General: Well developed white female in no acute distress Head: Normocephalic and  atraumatic Eyes:  Sclerae anicteric, conjunctiva pink. Ears: Normal auditory acuity Lungs: Clear throughout to auscultation; no increased WOB. Heart: Regular rate and rhythm; no M/R/G. Abdomen: Soft, obese, non-distended.  BS present.  Non-tender. Musculoskeletal: Symmetrical with no gross deformities  Skin: No lesions on visible extremities Extremities: No edema  Neurological: Alert oriented x 4, grossly non-focal Psychological:  Alert and cooperative. Normal mood and affect  ASSESSMENT AND PLAN: *69 year old female with recurrent vague abdominal complaints of upper abdominal pain, bloating, belching, nausea.  Previous evaluation as above.  We will plan for CT scan of the abdomen and pelvis with contrast.  Will check labs today including CBC, CMP, and celiac studies.  If these prove unremarkable then may need repeat colonoscopy as the last was in 2013.  I think that most of her symptoms are functional and likely represent functional dyspepsia/IBS, however.  May need  to consider trial of dicyclomine.   CC:  Crist Infante, MD

## 2020-04-16 LAB — TISSUE TRANSGLUTAMINASE ABS,IGG,IGA
(tTG) Ab, IgA: 1 U/mL
(tTG) Ab, IgG: 1 U/mL

## 2020-04-20 ENCOUNTER — Other Ambulatory Visit: Payer: Self-pay

## 2020-04-20 ENCOUNTER — Ambulatory Visit (INDEPENDENT_AMBULATORY_CARE_PROVIDER_SITE_OTHER)
Admission: RE | Admit: 2020-04-20 | Discharge: 2020-04-20 | Disposition: A | Discharge status: Home / Self Care | Payer: Medicare Other | Source: Ambulatory Visit | Attending: Physician Assistant | Admitting: Physician Assistant

## 2020-04-20 DIAGNOSIS — R1084 Generalized abdominal pain: Secondary | ICD-10-CM | POA: Diagnosis not present

## 2020-04-20 DIAGNOSIS — R140 Abdominal distension (gaseous): Secondary | ICD-10-CM

## 2020-04-20 MED ORDER — IOHEXOL 300 MG/ML  SOLN
100.0000 mL | Freq: Once | INTRAMUSCULAR | Status: AC | PRN
  Administered 2020-04-20: 100 mL via INTRAVENOUS

## 2020-04-20 NOTE — Progress Notes (Signed)
Reviewed and agree with documentation and assessment and plan. K. Veena Pete Merten , MD   

## 2020-04-22 ENCOUNTER — Other Ambulatory Visit: Payer: Self-pay

## 2020-04-22 MED ORDER — DICYCLOMINE HCL 10 MG PO CAPS
10.0000 mg | ORAL_CAPSULE | Freq: Three times a day (TID) | ORAL | 0 refills | Status: DC
Start: 2020-04-22 — End: 2021-09-09

## 2020-04-29 ENCOUNTER — Encounter: Payer: Self-pay | Admitting: Gastroenterology

## 2020-05-10 ENCOUNTER — Other Ambulatory Visit: Payer: Self-pay

## 2020-05-10 ENCOUNTER — Ambulatory Visit (AMBULATORY_SURGERY_CENTER): Payer: Self-pay | Admitting: *Deleted

## 2020-05-10 VITALS — Ht 59.0 in | Wt 196.0 lb

## 2020-05-10 DIAGNOSIS — R1084 Generalized abdominal pain: Secondary | ICD-10-CM

## 2020-05-10 MED ORDER — SUTAB 1479-225-188 MG PO TABS: 1.0000 | ORAL_TABLET | Freq: Once | ORAL | 0 refills | Status: AC

## 2020-05-10 NOTE — Progress Notes (Signed)

## 2020-05-11 ENCOUNTER — Encounter: Payer: Self-pay | Admitting: Gastroenterology

## 2020-05-13 ENCOUNTER — Telehealth: Payer: Self-pay | Admitting: Gastroenterology

## 2020-05-13 NOTE — Telephone Encounter (Signed)
[facility] called stating that sutab is not covered by pt's insurance. Suprep or gavilyte is.

## 2020-05-13 NOTE — Telephone Encounter (Signed)
Pharmacy given coupon info over the phone.

## 2020-05-24 ENCOUNTER — Other Ambulatory Visit: Payer: Self-pay

## 2020-05-24 ENCOUNTER — Encounter: Payer: Self-pay | Admitting: Gastroenterology

## 2020-05-24 ENCOUNTER — Ambulatory Visit (AMBULATORY_SURGERY_CENTER): Payer: Medicare Other | Admitting: Gastroenterology

## 2020-05-24 VITALS — BP 130/73 | HR 64 | Temp 96.9°F | Resp 12 | Ht 59.0 in | Wt 196.0 lb

## 2020-05-24 DIAGNOSIS — D123 Benign neoplasm of transverse colon: Secondary | ICD-10-CM | POA: Diagnosis not present

## 2020-05-24 DIAGNOSIS — K5732 Diverticulitis of large intestine without perforation or abscess without bleeding: Secondary | ICD-10-CM | POA: Diagnosis not present

## 2020-05-24 DIAGNOSIS — K529 Noninfective gastroenteritis and colitis, unspecified: Secondary | ICD-10-CM | POA: Diagnosis not present

## 2020-05-24 DIAGNOSIS — D128 Benign neoplasm of rectum: Secondary | ICD-10-CM

## 2020-05-24 DIAGNOSIS — R197 Diarrhea, unspecified: Secondary | ICD-10-CM

## 2020-05-24 DIAGNOSIS — K635 Polyp of colon: Secondary | ICD-10-CM | POA: Diagnosis not present

## 2020-05-24 DIAGNOSIS — D124 Benign neoplasm of descending colon: Secondary | ICD-10-CM | POA: Diagnosis not present

## 2020-05-24 DIAGNOSIS — R1084 Generalized abdominal pain: Secondary | ICD-10-CM

## 2020-05-24 MED ORDER — SODIUM CHLORIDE 0.9 % IV SOLN: 500.0000 mL | Freq: Once | INTRAVENOUS | Status: DC

## 2020-05-24 MED ORDER — METRONIDAZOLE 500 MG PO TABS
500.0000 mg | ORAL_TABLET | Freq: Three times a day (TID) | ORAL | 0 refills | Status: AC
Start: 2020-05-24 — End: 2020-05-31

## 2020-05-24 MED ORDER — CIPROFLOXACIN HCL 500 MG PO TABS: 500.0000 mg | ORAL_TABLET | Freq: Two times a day (BID) | ORAL | 0 refills | Status: AC

## 2020-05-24 NOTE — Op Note (Signed)
[place] Patient Name: Shawna Gonzalez Procedure Date: 05/24/2020 2:07 PM MRN: [id number] Endoscopist: Mauri Pole , MD Age: 69 Referring MD:  Date of Birth: [birth year] Gender: Female Account #: [id number] Procedure:                Colonoscopy Indications:              Clinically significant diarrhea of unexplained                            origin, Generalized abdominal pain Medicines:                Monitored Anesthesia Care Procedure:                Pre-Anesthesia Assessment:                           - Prior to the procedure, a History and Physical                            was performed, and patient medications and                            allergies were reviewed. The patient's tolerance of                            previous anesthesia was also reviewed. The risks                            and benefits of the procedure and the sedation                            options and risks were discussed with the patient.                            All questions were answered, and informed consent                            was obtained. Prior Anticoagulants: The patient has                            taken no previous anticoagulant or antiplatelet                            agents. ASA Grade Assessment: II - A patient with                            mild systemic disease. After reviewing the risks                            and benefits, the patient was deemed in                            satisfactory condition to undergo the procedure.  After obtaining informed consent, the colonoscope                            was passed under direct vision. Throughout the                            procedure, the patient's blood pressure, pulse, and                            oxygen saturations were monitored continuously. The                            Colonoscope was introduced through the anus and                            advanced to the the  cecum, identified by                            appendiceal orifice and ileocecal valve. The                            colonoscopy was performed without difficulty. The                            patient tolerated the procedure well. The quality                            of the bowel preparation was good. The ileocecal                            valve, appendiceal orifice, and rectum were                            photographed. Scope In: 2:17:56 PM Scope Out: 2:38:26 PM Scope Withdrawal Time: 0 hours 18 minutes 4 seconds  Total Procedure Duration: 0 hours 20 minutes 30 seconds  Findings:                 The perianal and digital rectal examinations were                            normal.                           Three sessile polyps were found in the descending                            colon, transverse colon and ascending colon. The                            polyps were 5 to 9 mm in size. These polyps were                            removed with a cold snare. Resection and retrieval  were complete.                           A less than 1 mm polyp was found in the rectum. The                            polyp was sessile. The polyp was removed with a                            cold biopsy forceps. Resection and retrieval were                            complete.                           Multiple small and large-mouthed diverticula were                            found in the sigmoid colon, descending colon,                            transverse colon, ascending colon and cecum.                            Purulent discharge was seen in association with the                            diverticular opening, suspicious of diverticulitis                            in descending colon. Biopsies for histology were                            taken with a cold forceps from the right colon and                            left colon for evaluation of microscopic  colitis.                           The exam was otherwise normal throughout the                            examined colon. Complications:            No immediate complications. Estimated Blood Loss:     Estimated blood loss was minimal. Impression:               - Three 5 to 9 mm polyps in the descending colon,                            in the transverse colon and in the ascending colon,                            removed with a cold snare. Resected and retrieved.                           -  One less than 1 mm polyp in the rectum, removed                            with a cold biopsy forceps. Resected and retrieved.                           - Moderate diverticulosis in the sigmoid colon, in                            the descending colon, in the transverse colon, in                            the ascending colon and in the cecum. Purulent                            discharge was seen in association with the                            diverticular opening, suspicious of diverticulitis.                            Biopsied. Recommendation:           - Patient has a contact number available for                            emergencies. The signs and symptoms of potential                            delayed complications were discussed with the                            patient. Return to normal activities tomorrow.                            Written discharge instructions were provided to the                            patient.                           - Resume previous diet.                           - Continue present medications.                           - Await pathology results.                           - Repeat colonoscopy in 3 - 5 years for                            surveillance based on pathology results.                           -  Cipro (ciprofloxacin) 500 mg PO BID for 7 days.                           - Flagyl (metronidazole) 500 mg PO TID for 7 days. Mauri Pole,  MD 05/24/2020 2:59:29 PM This report has been signed electronically.

## 2020-05-24 NOTE — Patient Instructions (Signed)
**Note De-Identified  Obfuscation** Handouts provided on polyps and diverticulosis.   Prescriptions for Cipro and Flagyl sent to your pharmacy to treat suspected diverticulitis.   YOU HAD AN ENDOSCOPIC PROCEDURE TODAY AT [facility]:   Refer to the procedure report that was given to you for any specific questions about what was found during the examination.  If the procedure report does not answer your questions, please call your gastroenterologist to clarify.  If you requested that your care partner not be given the details of your procedure findings, then the procedure report has been included in a sealed envelope for you to review at your convenience later.  YOU SHOULD EXPECT: Some feelings of bloating in the abdomen. Passage of more gas than usual.  Walking can help get rid of the air that was put into your GI tract during the procedure and reduce the bloating. If you had a lower endoscopy (such as a colonoscopy or flexible sigmoidoscopy) you may notice spotting of blood in your stool or on the toilet paper. If you underwent a bowel prep for your procedure, you may not have a normal bowel movement for a few days.  Please Note:  You might notice some irritation and congestion in your nose or some drainage.  This is from the oxygen used during your procedure.  There is no need for concern and it should clear up in a day or so.  SYMPTOMS TO REPORT IMMEDIATELY:   Following lower endoscopy (colonoscopy or flexible sigmoidoscopy):  Excessive amounts of blood in the stool  Significant tenderness or worsening of abdominal pains  Swelling of the abdomen that is new, acute  Fever of 100F or higher  For urgent or emergent issues, a gastroenterologist can be reached at any hour by calling [phone]. Do not use MyChart messaging for urgent concerns.    DIET:  We do recommend a small meal at first, but then you may proceed to your regular diet.  Drink plenty of fluids but you should avoid alcoholic beverages for 24  hours.  ACTIVITY:  You should plan to take it easy for the rest of today and you should NOT DRIVE or use heavy machinery until tomorrow (because of the sedation medicines used during the test).    FOLLOW UP: Our staff will call the number listed on your records 48-72 hours following your procedure to check on you and address any questions or concerns that you may have regarding the information given to you following your procedure. If we do not reach you, we will leave a message.  We will attempt to reach you two times.  During this call, we will ask if you have developed any symptoms of COVID 19. If you develop any symptoms (ie: fever, flu-like symptoms, shortness of breath, cough etc.) before then, please call [phone].  If you test positive for Covid 19 in the 2 weeks post procedure, please call and report this information to [place].    If any biopsies were taken you will be contacted by phone or by letter within the next 1-3 weeks.  Please call us at [phone] if you have not heard about the biopsies in 3 weeks.    SIGNATURES/CONFIDENTIALITY: You and/or your care partner have signed paperwork which will be entered into your electronic medical record.  These signatures attest to the fact that that the information above on your After Visit Summary has been reviewed and is understood.  Full responsibility of the confidentiality of this discharge information lies with **Note De-Identified  Obfuscation** you and/or your care-partner.

## 2020-05-24 NOTE — Progress Notes (Signed)
Pt's states no medical or surgical changes since previsit or office visit.   VS taken by DT 

## 2020-05-24 NOTE — Progress Notes (Signed)
Called to room to assist during endoscopic procedure.  Patient ID and intended procedure confirmed with present staff. Received instructions for my participation in the procedure from the performing physician.  

## 2020-05-24 NOTE — Progress Notes (Signed)
Report given to PACU, vss 

## 2020-05-26 ENCOUNTER — Telehealth: Payer: Self-pay | Admitting: *Deleted

## 2020-05-26 NOTE — Telephone Encounter (Signed)
  Follow up Call-  Call back number 05/24/2020  Post procedure Call Back phone  # [phone]  Permission to leave phone message Yes  Some recent data might be hidden     Patient questions:  Do you have a fever, pain , or abdominal swelling?yes Pain Score 2 *  Have you tolerated food without any problems? Yes.    Have you been able to return to your normal activities? Yes.    Do you have any questions about your discharge instructions: Diet   No. Medications  No. Follow up visit  No.  Do you have questions or concerns about your Care? No.  Actions: * If pain score is 4 or above: 1. No action needed, pain <4.Have you developed a fever since your procedure? no  2.   Have you had an respiratory symptoms (SOB or cough) since your procedure? no  3.   Have you tested positive for COVID 19 since your procedure no  4.   Have you had any family members/close contacts diagnosed with the COVID 19 since your procedure?  no   If yes to any of these questions please route to Joylene John, RN and Erenest Rasher, RN

## 2020-05-28 ENCOUNTER — Telehealth: Payer: Self-pay | Admitting: Gastroenterology

## 2020-05-28 NOTE — Telephone Encounter (Signed)
Advised her the results have not been reviewed by her provider. She mentions she has ongoing nausea. This was before she started the antibiotics. No vomiting, but a sense of needing to. She has not tried the Bentyl yet. She will inquire about any drug to drug interaction with her pharmacist. If she is satisfied with the findings, she will try the Bentyl. Otherwise she will let us know if the nausea is still a problem or acutely worsens.

## 2020-05-28 NOTE — Telephone Encounter (Signed)
Requesting path results 

## 2020-06-01 ENCOUNTER — Encounter: Payer: Self-pay | Admitting: Gastroenterology

## 2021-02-01 ENCOUNTER — Ambulatory Visit: Payer: Medicare Other | Admitting: Neurology

## 2021-02-01 ENCOUNTER — Encounter: Payer: Self-pay | Admitting: Neurology

## 2021-02-01 ENCOUNTER — Other Ambulatory Visit: Payer: Self-pay

## 2021-02-01 VITALS — BP 158/83 | HR 94 | Ht <= 58 in | Wt 210.0 lb

## 2021-02-01 DIAGNOSIS — G4733 Obstructive sleep apnea (adult) (pediatric): Secondary | ICD-10-CM | POA: Diagnosis not present

## 2021-02-01 DIAGNOSIS — Z9989 Dependence on other enabling machines and devices: Secondary | ICD-10-CM

## 2021-02-01 DIAGNOSIS — G8929 Other chronic pain: Secondary | ICD-10-CM | POA: Diagnosis not present

## 2021-02-01 DIAGNOSIS — E662 Morbid (severe) obesity with alveolar hypoventilation: Secondary | ICD-10-CM | POA: Diagnosis not present

## 2021-02-01 DIAGNOSIS — G4734 Idiopathic sleep related nonobstructive alveolar hypoventilation: Secondary | ICD-10-CM

## 2021-02-01 DIAGNOSIS — G4701 Insomnia due to medical condition: Secondary | ICD-10-CM

## 2021-02-01 DIAGNOSIS — Z6841 Body Mass Index (BMI) 40.0 and over, adult: Secondary | ICD-10-CM

## 2021-02-01 NOTE — Progress Notes (Signed)
SLEEP MEDICINE CLINIC   Provider:  Larey Seat, M D  Referring Provider: Crist Infante, MD Primary Care Physician:  Crist Infante, MD  Chief Complaint  Patient presents with  . Follow-up    Pt alone, rm 10. Presents today for yearly follow up. D[facility] [employer]. 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   Interval history for ANTHONELLA KLAUSNER, 02-01-2021. Mrs. 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  This has been an around-the-clock job with in her own home.  She has maintained her CPAP compliant use she remains fatigued as expected, she gets a lot less sleep than she needs she is basically on call all the time.  She has used the machine 100% of the days of 400% of 4 hours or longer was an average use at time of 6 hours 47 minutes which may not reflect her true sleep time.  The machine is an AutoSet between 10 and 17 cmH2O with 3 cm EPR and her residual AHI is 0.3/h but there are high air leakage.  95th percentile is 15.8 cm water pressure there are no central apneas arising and given the low AHI I would not like to change her settings.  Her Epworth sleepiness score was endorsed at 0 her fatigue severity at 14 but she definitely has had some more sleep restriction and irregular sleep hours.  Blood pressure today is elevated here in the office, and her BMI is 44, this is her main risk factor alongside with her asthma history.    01-29-2020: RV with  Berenice Primas who is a 70 y.o. female patient and retired from [employer], now caretaker of her mother ( 33). she has received 2 shots of the [employer] vaccine. Mother had pneumonia, non- covid - and was cared for at home.  She has maintained high BMI , and has used her CPAP compliantly. She remain fatigued.  She gets about 3.5- 7 hours at night, not enough. She can't nap in  daytime - her mother still smokes, and drops ashes all over the house, has trouble to use a lighter.  She fell last Saturday.  The caretaking has been a 24/ 7 job- and her brother is not helping much.   HPI:  She has high BP today, is not short of breath, and she reports no pain at this time.  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  She had hoped to spend her retirement with different activities traveling etc.seen here as a referral  from Dr. Joylene Draft for sleep hypoxemia, question if sleep apnea is presented.  Chief complaint according to patient : I had a CPAP in the past but it didn't work well for me "I'm using oxygen at night but I have to wake up 4-5 times to go to the bathroom interrupting my sleep."  Sleep habits are as follows: Mrs. Vanosdol reports that she often falls asleep in a chair in the living room rather than in her own bed, and that she feels drowsy or excessively daytime sleepy several times a day. She also reports a very fragmented sleep wake pattern. She often wakes up from sleep because of nocturia and her bathroom breaks continue in daytime. She may go to sleep inadvertently in the recliner at about 8 or 9 PM  and then transferred hour later to the bedroom proper. She may there fall asleep for an hour to 2 hours , her sleep is fragmented and these little portions. Sometimes she is too tired to sleepy to transfer from the recliner to the bedroom when she will remain all night in the recliner. In the morning she tries to rise at about 6:30 AM she prepares breakfast for family, and by 7:15 she has to leave for her work. 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 She does not have a shift work history. Mrs. Ohagan's medical history is as follows. She has some morbid obesity, diabetes mellitus type 2, osteoporosis, sleep related hypoxemia with partially treated obstructive sleep apnea or untreated obstructive sleep  apnea. 7 years ago she was diagnosed with a melanoma. She had a vitamin D deficiency, allergic rhinitis, and congenital nystagmus. Her family history is positive for arthritis, diabetes, hypertension stroke and CVA. Sleep medical history and family sleep history: she was diagnosed with OSA in 2009, but could not tolerate CPAP. Social history:  The patient lives with her mother she is single and has no children, she does not drink alcohol she does not smoke she does not use recreational drugs.   01-18-16 Mrs. Falzon was just last week treated with an IV infusion for a severe asthma attack. She has asthma related sleep hypoxemia she has obstructive sleep apnea and underwent CPAP titration on 10/18/2015. She has continued to use on CPAP between 10 and 15 cm water with 37 m EPR and was a prolonged ramp time. Her download data obtained in the office look excellent. 97% compliance 6 hours and 5 minutes of daily use and a residual AHI of only 1.5. She does not have oxygen at home. She is using multiple medications and rescue inhalers for her underlying condition of asthma and hypoventilation. Her asthma exacerbated with allergies.  She is often sleeping in her recliner watching TV, and goes from there to the bed, continuing to sleep.   Interval history from 2-1- 2018 = pleasure of seeing Mrs. Feigel was medical history includes now a recent hysterectomy. She has been more sleep deprived over the last 10 days or so and during her hospitalization could not bring her CPAP with her. Her compliance is accordingly reduced the hospital did not provide a CPAP. Her average user time is now reduced to 3 hours and 6 minutes but related to medical reasons. She is using an AutoSet between 10 and 15 cm water with 3 cm EPR and her 95th percentile pressure is at 12.9 cm, her residual apnea index is 1.4 which is a great resolution. There are no major air leaks noted. Her Epworth sleepiness score is reduced to 4 points from  13 prior to CPAP her fatigue severity is 9 points only and her depression score is endorsed at 2 out of 15 possible points.  I have the pleasure of seeing Mrs. Plunk today on 24 January 2018 and I scheduled revisit. She has as usual done exemplary well with the use of her CPAP 87%compliance.  Average use of time 5 hours 52 minutes, she is using an AutoSet between 10 and 15 cm water pressure with 3 cm EPR, her residual AHI is only 0.5 apneas per hour.  She still has air leaks but what is more concerning is that she reports she wakes up so frequently that the sleep would be considered heavily fragmented.  Often she doubts that she even  gets 5 hours of sleep . She denies nocturia but has a lot of hip pain and discomfort. In December 2018 she received a shot of cortisone in her knee which has helped somewhat with the knee pain, however the hip pain is now most dominant. She  endorsed the Epworth sleepiness score at 0 points, fatigue severity at 14 points, she does not take daytime naps.  The geriatric depression score was endorsed at 1 out of 15 points.   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   Review of Systems: hip. Knee and back pain. Severe Obesity with SOB, wheezing.  Hearing loss.  Depressed.   Out of a complete 14 system review, the patient complains of only the following symptoms, and all other reviewed systems are negative. Weight gain,  How likely are you to doze in the following situations: 0 = not likely, 1 = slight chance, 2 = moderate chance, 3 = high chance  Sitting and Reading? Watching Television? Sitting inactive in a public place (theater or meeting)? Lying down in the afternoon when circumstances permit? Sitting and talking to someone? Sitting quietly after lunch without alcohol? In a car, while stopped for a few minutes in traffic? As a passenger in a car for an hour without a break?  Total = 0   Epworth sleepiness score  Remained  0 from 13 points pre- CPAP ,  Fatigue severity score was 45 points before CPAP , now at 14 points. GDS /geriatric depression score 3/ 15  points.    Social History   Socioeconomic History  . Marital status: Single    Spouse name: Not on file  . Number of children: 0  . Years of education: 75  . Highest education level: Not on file  Occupational History  . Occupation: customer service    Employer: [employer]  Tobacco Use  . Smoking status: Never Smoker  . Smokeless tobacco: Never Used  Vaping Use  . Vaping Use: Never used  Substance and Sexual Activity  . Alcohol use: No  . Drug use: No  . Sexual activity: Not on file  Other Topics Concern  . Not on file  Social History Narrative   Lives at home with mother.   Caffeine use: Diet and caffeine free soda (8 drinks per day)         Epworth Sleepiness Scale = 7 (as of 01/26/2016)   Social Determinants of Health   Financial Resource Strain: Not on file  Food Insecurity: Not on file  Transportation Needs: Not on file  Physical Activity: Not on file  Stress: Not on file  Social Connections: Not on file  Intimate Partner Violence: Not on file    Family History  Problem Relation Age of Onset  . Colon polyps Mother   . Heart disease Mother   . Colon polyps Father   . Colon polyps Brother   . Colon cancer Neg Hx   . Esophageal cancer Neg Hx   . Stomach cancer Neg Hx     Past Medical History:  Diagnosis Date  . Adenomatous colon polyp   . Anemia    history of  . Arthritis   . Asthma   . Atypical chest pain   . Bacterial ear infection 2018  . Cancer ([place])    melanoma skin cancer 7 years ago  . Cataract   . Diabetes mellitus without complication ([place])    history of taking diabetic medication, has lost weight  no longer needing medciation at this time  . GERD (gastroesophageal reflux disease)   . Glaucoma   . Heart murmur    slight  . Hypertension   . Macular degeneration   . Osteoporosis   . Sleep apnea    cpap   . Ulcerative colitis, chronic ([place])     Past Surgical History:  Procedure Laterality Date  . CARPAL TUNNEL RELEASE Right 12/16/2009  . CATARACT EXTRACTION W/ INTRAOCULAR LENS  IMPLANT, BILATERAL Bilateral 05/21/2007(Right); 06/04/2007(Left)  . CHOLECYSTECTOMY  2000  . COLONOSCOPY WITH PROPOFOL  01/23/2012  . DE QUERVAIN'S RELEASE Right 11/30/2005  . HYSTERECTOMY ABDOMINAL WITH SALPINGECTOMY Bilateral 01/09/2017   Procedure: HYSTERECTOMY ABDOMINAL WITH SALPINGOOPHORECTOMY;  Surgeon: Dian Queen, MD;  Location: [facility];  Service: Gynecology;  Laterality: Bilateral;  . TONSILLECTOMY    . UPPER GI ENDOSCOPY  03/05/2017   MAC    Current Outpatient Medications  Medication Sig Dispense Refill  . albuterol (PROVENTIL HFA;VENTOLIN HFA) 108 (90 BASE) MCG/ACT inhaler Inhale 2 puffs into the lungs every 6 (six) hours as needed for wheezing or shortness of breath.    . budesonide-formoterol (SYMBICORT) 160-4.5 MCG/ACT inhaler Inhale 2 puffs into the lungs daily.    Marland Kitchen dicyclomine (BENTYL) 10 MG capsule Take 1 capsule (10 mg total) by mouth 4 (four) times daily -  before meals and at bedtime. 90 capsule 0  . dorzolamide (TRUSOPT) 2 % ophthalmic solution INSTILL 1 DROP INTO BOTH EYES THREE TIMES DAILY 30 mL 0  . furosemide (LASIX) 40 MG tablet Take 40 mg by mouth every evening.   4  . ibuprofen (ADVIL,MOTRIN) 600 MG tablet Take 1 tablet (600 mg total) by mouth every 6 (six) hours as needed (mild pain). 60 tablet 0  . levocetirizine (XYZAL) 5 MG tablet Take 5 mg by mouth every evening.    Marland Kitchen losartan (COZAAR) 50 MG tablet Take 50 mg by mouth every evening.     . Multiple Vitamins-Minerals (MULTIVITAMIN GUMMIES ADULT) CHEW Chew 2 tablets by mouth every evening.    Marland Kitchen omeprazole (PRILOSEC) 20 MG capsule Take 1 capsule (20 mg total) by mouth daily. 28 capsule 0  . potassium chloride SA (K-DUR,KLOR-CON) 20 MEQ tablet Take 20 mEq by mouth every Tuesday, Thursday, and Saturday at 6 PM.     . traMADol (ULTRAM)  50 MG tablet Take 1 tablet (50 mg total) by mouth every 6 (six) hours as needed for moderate pain. 30 tablet 0  . Travoprost, BAK Free, (TRAVATAN) 0.004 % SOLN ophthalmic solution PLACE 1 DROP INTO BOTH EYES NIGHTLY    . Vitamin D, Ergocalciferol, (DRISDOL) 50000 UNITS CAPS Take 50,000 Units by mouth 2 (two) times a week. Sunday & Thursday     No current facility-administered medications for this visit.    Allergies as of 02/01/2021 - Review Complete 02/01/2021  Allergen Reaction Noted  . Amlodipine Cough 08/03/2015  . Bydureon [exenatide] Itching and Other (See Comments) 08/03/2015  . Lisinopril Cough 08/03/2015  . Lunesta [eszopiclone] Other (See Comments) 08/03/2015  . Sulfa antibiotics Rash 01/09/2012    Vitals: BP (!) 158/83   Pulse 94   Ht _0  (1.473 m)   Wt 210 lb (95.3 kg)   BMI 43.89 kg/m  Last Weight:  Wt Readings from Last 1 Encounters:  02/01/21 210 lb (95.3 kg)   YWV:PXTG mass index is 43.89 kg/m.     Last Height:   Ht Readings from Last 1 Encounters:  02/01/21 _1  (1.473 m)  Review of Systems: the patient has a primary vision loss, baseline strabismus, and she has had signs of obesity hypoventilation in the past.  Blood pressure was found to be elevated today but she is not in pain, she is however under stress.  She has become a caregiver 24/ 57 to her 35 year-old mother- who was a chain smoker and suffers from dementia, and lung cancer.    Epworth sleepiness score was endorsed at 0/ 24  , depression score was only endorsed at 8 out of 15 points.    She is a compliant CPAP user over the last 30 days has used the machine every day and every day over 4 hours with an average use at time of 7 hours 12 minutes, she is using an AutoSet between 10 and 15 cm water pressure with 3 cm EPR, her AHI is 0.7 the 95th percentile pressure is 13.7 cmH2O she does have high air leaks from time to time.   This seems to be a trend since late January and she had highest air leaks  in the first week of February.  Physical exam: General: The patient is awake, alert and appears not in acute distress. The patient is well groomed. Head: Normocephalic, atraumatic. Neck is supple. Mallampati 4 ,  neck circumference: 18.5 . Nasal airflow restricted, TMJ is not  evident . Retrognathia is seen.  Cardiovascular:  Regular rate and rhythm , without  murmurs or carotid bruit, and without distended neck veins. Respiratory: wheezing, SOB , baseline RR 18. Skin:  Ankle  edema, or rash Trunk: BMI was down to 37 from 50 at the time of PSG.  Now back up to 43.9 kg/m2 . The patient's abdomen is most affected   Neurologic exam : The patient is awake and alert, oriented to place and time.   Speech is fluent,  without  dysarthria, dysphonia or aphasia.  She is not longer short of breath,  Mood and affect are irritated.   Cranial nerves: taste and smell reportedly intact. Pupils are equal and briskly reactive to light. Baseline strabism.  Primary nystagmus is still present. She has non -conjugate eye movements. Macular degeneration. Wet type. Visual fields by finger perimetry impaired-  not scotoma, but peripheral vision changes, tunnel vision. Legally blind.   Facial motor strength is symmetric and tongue and uvula move midline. Shoulder shrug was symmetrical.     Assessment:  After physical and neurologic examination, review of laboratory studies,  Personal review of imaging studies, reports of other /same  Imaging studies ,  Results of polysomnography/ neurophysiology testing and pre-existing records as far as provided in visit., my assessment is    1) Asthma and OSA overlapping syndrome.She also had signs of obesity hypoventilation, She  has done very well with CPAP which has also made oxygen supplementation obsolete.  Reported chronic sleep related hypoxemia is treated by CPAP, in an  asthmatic and morbidly obese patient.   2)Highly compliant CPAP patient.  The patient remains a  highly compliant 100% compliant CPAP patient by time and days.   She does have high air leakage -  In relation to the recent regaining of weight I would like to increase the maximum pressure to 17.    3) obesity- emotional eating, gets 9 k steps and takes 5 k Calories-   I also encouraged the patient to consider calling back to the weight and wellness center but her mother's physical decline has made it impossible for her to keep some of  these appointments. She never made the appointment when the center called. She has a significant pannus.     Her obesity is truncal affecting her diaphragmatic strength and her chest wall movements. Continue CPAP, try to lose weight.  Consider medical weight loss clinic. I would be happy to re-refer her to Dr. Leafy Ro. Plan: RV with NP in 12 month.     Asencion Partridge Sadia Belfiore MD  02/01/2021   CC: Crist Infante, [place],  [place] [id number]

## 2021-02-01 NOTE — Patient Instructions (Signed)
Safe Lifting Techniques for Caregivers Being a caregiver can be physically difficult. It can also be dangerous for your back, neck, or shoulders. If you do not use safe lifting techniques, both you and the person you care for could be injured. You have a greater risk of injury when you:  Help a person sit up from lying down.  Help a person get out of bed.  Move a person from a bed to a chair or wheelchair.  Lean over someone for long periods of time. Learn and follow basic lifting techniques to prevent injuries. What are the risks? Without using safe lifting techniques, caregivers are at higher risk of injuring their back, neck, and shoulders. These injuries can be sudden and traumatic, or they can happen over time from repeating the same stressful motion over and over (overuse injuries). Overuse injuries are more common. These happen because muscles and joints do not have time to recover between repeated stressful movements. Using safe lifting techniques can help prevent injuries. General tips Before lifting 1. Stretch and warm up your muscles before lifting or moving. Cold and stiff muscles get injured more easily. 2. Talk to the person about what you are going to do. This may help him or her assist you with the lifting. While lifting 1. Always keep your head, shoulders, and back aligned. Twisting is a major cause of injury. 2. If you need to move while lifting, shift your feet in small steps while keeping your back straight. 3. Do not let a person you are lifting lock arms around your neck. This can make you lose your balance or fall forward, and can place strain on your neck. 4. Always keep your back as straight as possible. Bending at the waist or leaning over is a major cause of back injury. 5. When you are lifting a person or a heavy object, do not lift with your arms extended. Keep the person or object close to your body. 6. Keep your feet spread shoulder-width apart, with one foot  slightly in front of the other. This helps you maintain balance within your center of gravity when lifting. 7. Use the big muscles in your legs and arms instead of your back muscles to lift or pull. Tighten your belly muscles to help support your back. 8. If you have trouble lifting a person, or the person fights you or is stuck in a difficult position, do not try to lift alone. Get help. 9. If you feel pain while lifting, stop and get help. How to help someone sit up, stand up, or sit down Helping someone sit up, stand up, or sit down involves both lifting and turning. These movements require special techniques. Do not bend, twist, or lift with your back muscles. To help someone sit up to get out of bed: 1. Place the chair or wheelchair close to the bed. Make sure the wheelchair wheels are locked. 2. Place one arm under the person's shoulders and the other arm under the person's knees. 3. Keep your feet spread shoulder-width apart and bend at your knees, not your back. 4. Keep your back straight, and turn the person so that their legs come out over the bed and their upper body comes up straight into a sitting position. To help someone stand up from a bed, chair, or wheelchair: 1. Make sure the wheelchair wheels are locked. 2. Position the person's feet on the floor spread shoulder-width apart. 3. Place the person's hands on the bed or arms of  the chair, not around your neck. 4. Place your arms around the person's upper back and clasp your hands together. 5. Hold the person close to you, and lean your weight back. Use your legs while keeping your back straight. Avoid pulling with your back. 6. Consider using a lifting belt around the person's waist to make lifting safer and easier.      To help someone sit down: 1. When someone is standing with you, support the person close to your body. 2. Turn to the chair or wheelchair with small shuffling steps. 3. Keep your back straight. 4. Have the  person put both hands on the arms of the chair, wheelchair, or surface they are going to sit on. 5. Lower the person into the chair by bending your knees, not by leaning forward.   Contact a health care provider if:  You have pain while moving or lifting.  You have pain after moving or lifting.  You have pain while resting.  You have tingling, numbness, or shooting pain anywhere in your body.  You have bruising, swelling, or stiffness.  You have weakness that makes lifting hard. Summary  Being a caregiver for a person who needs help sitting up, getting out of bed, or moving from a bed to a chair can be physically difficult.  Lifting without proper technique can cause back, neck, and shoulder injuries.  Most injuries happen from bending over to give care, pulling someone up and out of bed, or moving someone from a bed to a chair.  Learn and follow basic lifting techniques to prevent injuries.  Do not bend, twist, or lift with your back muscles. This information is not intended to replace advice given to you by your health care provider. Make sure you discuss any questions you have with your health care provider. Document Revised: 02/12/2020 Document Reviewed: 03/25/2019 Elsevier Patient Education  [place]. https://point-of-care.elsevierperformancemanager.com/skills">  Dementia Caregiver Guide Dementia is a term used to describe a number of symptoms that affect memory and thinking. The most common symptoms include:  Memory loss.  Trouble with language and communication.  Trouble concentrating.  Poor judgment and problems with reasoning.  Wandering from home or public places.  Extreme anxiety or depression.  Being suspicious or having angry outbursts and accusations.  Child-like behavior and language. Dementia can be frightening and confusing. And taking care of someone with dementia can be challenging. This guide provides tips to help you when providing care  for a person with dementia. How to help manage lifestyle changes Dementia usually gets worse slowly over time. In the early stages, people with dementia can stay independent and safe with some help. In later stages, they need help with daily tasks such as dressing, grooming, and using the bathroom. There are actions you can take to help a person manage his or her life while living with this condition. Communicating  When the person is talking or seems frustrated, make eye contact and hold the person's hand.  Ask specific questions that need yes or no answers.  Use simple words, short sentences, and a calm voice. Only give one direction at a time.  When offering choices, limit the person to just one or two.  Avoid correcting the person in a negative way.  If the person is struggling to find the right words, gently try to help him or her. Preventing injury  Keep floors clear of clutter. Remove rugs, magazine racks, and floor lamps.  Keep hallways well lit, especially at  night.  Put a handrail and nonslip mat in the bathtub or shower.  Put childproof locks on cabinets that contain dangerous items, such as medicines, alcohol, guns, toxic cleaning items, sharp tools or utensils, matches, and lighters.  For doors to the outside of the house, put the locks in places where the person cannot see or reach them easily. This will help ensure that the person does not wander out of the house and get lost.  Be prepared for emergencies. Keep a list of emergency phone numbers and addresses in a convenient area.  Remove car keys and lock garage doors so that the person does not try to get in the car and drive.  Have the person wear a bracelet that tracks locations and identifies the person as having memory problems. This should be worn at all times for safety.   Helping with daily life  Keep the person on track with his or her routine.  Try to identify areas where the person may need help.  Be  supportive, patient, calm, and encouraging.  Gently remind the person that adjusting to changes takes time.  Help with the tasks that the person has asked for help with.  Keep the person involved in daily tasks and decisions as much as possible.  Encourage conversation, but try not to get frustrated if the person struggles to find words or does not seem to appreciate your help.   How to recognize stress Look for signs of stress in yourself and in the person you are caring for. If you notice signs of stress, take steps to manage it. Symptoms of stress include:  Feeling anxious, irritable, frustrated, or angry.  Denying that the person has dementia or that his or her symptoms will not improve.  Feeling depressed, hopeless, or unappreciated.  Difficulty sleeping.  Difficulty concentrating.  Developing stress-related health problems.  Feeling like you have too little time for your own life. Follow these instructions at home: Take care of your health Make sure that you and the person you are caring for:  Get regular sleep.  Exercise regularly.  Eat regular, nutritious meals.  Take over-the-counter and prescription medicines only as told by your health care providers.  Drink enough fluid to keep your urine pale yellow.  Attend all scheduled health care appointments.   General instructions  Join a support group with others who are caregivers.  Ask about respite care resources. Respite care can provide short-term care for the person so that you can have a regular break from the stress of caregiving.  Consider any safety risks and take steps to avoid them.  Organize medicines in a pill box for each day of the week.  Create a plan to handle any legal or financial matters. Get legal or financial advice if needed.  Keep a calendar in a central location to remind the person of appointments or other activities. Where to find support: Many individuals and organizations offer  support. These include:  Support groups for people with dementia.  Support groups for caregivers.  Counselors or therapists.  Home health care services.  Adult day care centers. Where to find more information  Centers for Disease Control and Prevention: http://www.wolf.info/  Alzheimer's Association: [place].co.nz  Family Caregiver Alliance: www.caregiver.[place]: www.alzfdn.org Contact a health care provider if:  The person's health is rapidly getting worse.  You are no longer able to care for the person.  Caring for the person is affecting your physical and emotional health.  You are feeling depressed or anxious about caring for the person. Get help right away if:  The person threatens himself or herself, you, or anyone else.  You feel depressed or sad, or feel that you want to harm yourself. If you ever feel like your loved one may hurt himself or herself or others, or if he or she shares thoughts about taking his or her own life, get help right away. You can go to your nearest emergency department or:  Call your local emergency services (911 in the [place]  Call a suicide crisis helpline, such as the [place] at 934-325-9113. This is open 24 hours a day in the [place]  Text the Crisis Text Line at 430-075-0523 (in the [place].). Summary  Dementia is a term used to describe a number of symptoms that affect memory and thinking.  Dementia usually gets worse slowly over time.  Take steps to reduce the person's risk of injury and to plan for future care.  Caregivers need support, relief from caregiving, and time for their own lives. This information is not intended to replace advice given to you by your health care provider. Make sure you discuss any questions you have with your health care provider. Document Revised: 04/19/2020 Document Reviewed: 04/19/2020 Elsevier Patient Education  [place].

## 2021-03-01 ENCOUNTER — Other Ambulatory Visit: Payer: Self-pay | Admitting: Podiatry

## 2021-03-01 ENCOUNTER — Ambulatory Visit: Payer: Medicare Other | Admitting: Podiatry

## 2021-03-01 ENCOUNTER — Encounter: Payer: Self-pay | Admitting: Podiatry

## 2021-03-01 ENCOUNTER — Other Ambulatory Visit: Payer: Self-pay

## 2021-03-01 ENCOUNTER — Ambulatory Visit (INDEPENDENT_AMBULATORY_CARE_PROVIDER_SITE_OTHER): Payer: Medicare Other

## 2021-03-01 DIAGNOSIS — M2010 Hallux valgus (acquired), unspecified foot: Secondary | ICD-10-CM

## 2021-03-01 DIAGNOSIS — M2041 Other hammer toe(s) (acquired), right foot: Secondary | ICD-10-CM

## 2021-03-01 DIAGNOSIS — M7751 Other enthesopathy of right foot: Secondary | ICD-10-CM

## 2021-03-01 DIAGNOSIS — D2371 Other benign neoplasm of skin of right lower limb, including hip: Secondary | ICD-10-CM

## 2021-03-01 NOTE — Progress Notes (Signed)
Subjective:  Patient ID: Shawna Gonzalez, female    DOB: 1951/08/31,  MRN: [id number] HPI Chief Complaint  Patient presents with  . Toe Pain    4th toe laterally and 5th toe medially right - callused areas x several months, PCP tried trimming but continued pain  . Ankle Pain    Ankle right - patient states a doctor has been treating her ankle arthritis with injections-says may need MRI  . New Patient (Initial Visit)    70 y.o. female presents with the above complaint.   ROS: Denies fever chills nausea vomiting muscle aches pains calf pain back pain chest pain shortness of breath.  Past Medical History:  Diagnosis Date  . Adenomatous colon polyp   . Anemia    history of  . Arthritis   . Asthma   . Atypical chest pain   . Bacterial ear infection 2018  . Cancer ([place])    melanoma skin cancer 7 years ago  . Cataract   . Diabetes mellitus without complication ([place])    history of taking diabetic medication, has lost weight no longer needing medciation at this time  . GERD (gastroesophageal reflux disease)   . Glaucoma   . Heart murmur    slight  . Hypertension   . Macular degeneration   . Osteoporosis   . Sleep apnea    cpap  . Ulcerative colitis, chronic ([place])    Past Surgical History:  Procedure Laterality Date  . CARPAL TUNNEL RELEASE Right 12/16/2009  . CATARACT EXTRACTION W/ INTRAOCULAR LENS  IMPLANT, BILATERAL Bilateral 05/21/2007(Right); 06/04/2007(Left)  . CHOLECYSTECTOMY  2000  . COLONOSCOPY WITH PROPOFOL  01/23/2012  . DE QUERVAIN'S RELEASE Right 11/30/2005  . HYSTERECTOMY ABDOMINAL WITH SALPINGECTOMY Bilateral 01/09/2017   Procedure: HYSTERECTOMY ABDOMINAL WITH SALPINGOOPHORECTOMY;  Surgeon: Dian Queen, MD;  Location: [facility];  Service: Gynecology;  Laterality: Bilateral;  . TONSILLECTOMY    . UPPER GI ENDOSCOPY  03/05/2017   MAC    Current Outpatient Medications:  .  albuterol (PROVENTIL HFA;VENTOLIN HFA) 108 (90 BASE) MCG/ACT inhaler, Inhale 2 puffs  into the lungs every 6 (six) hours as needed for wheezing or shortness of breath., Disp: , Rfl:  .  budesonide-formoterol (SYMBICORT) 160-4.5 MCG/ACT inhaler, Inhale 2 puffs into the lungs daily., Disp: , Rfl:  .  dicyclomine (BENTYL) 10 MG capsule, Take 1 capsule (10 mg total) by mouth 4 (four) times daily -  before meals and at bedtime., Disp: 90 capsule, Rfl: 0 .  dorzolamide (TRUSOPT) 2 % ophthalmic solution, INSTILL 1 DROP INTO BOTH EYES THREE TIMES DAILY, Disp: 30 mL, Rfl: 0 .  esomeprazole (NEXIUM) 40 MG capsule, Take 40 mg by mouth 2 (two) times daily., Disp: , Rfl:  .  furosemide (LASIX) 40 MG tablet, Take 40 mg by mouth every evening. , Disp: , Rfl: 4 .  ibuprofen (ADVIL,MOTRIN) 600 MG tablet, Take 1 tablet (600 mg total) by mouth every 6 (six) hours as needed (mild pain)., Disp: 60 tablet, Rfl: 0 .  levocetirizine (XYZAL) 5 MG tablet, Take 5 mg by mouth every evening., Disp: , Rfl:  .  losartan (COZAAR) 50 MG tablet, Take 50 mg by mouth every evening. , Disp: , Rfl:  .  Multiple Vitamins-Minerals (MULTIVITAMIN GUMMIES ADULT) CHEW, Chew 2 tablets by mouth every evening., Disp: , Rfl:  .  potassium chloride SA (K-DUR,KLOR-CON) 20 MEQ tablet, Take 20 mEq by mouth every Tuesday, Thursday, and Saturday at 6 PM. , Disp: , Rfl:  .  traMADol (  ULTRAM) 50 MG tablet, Take 1 tablet (50 mg total) by mouth every 6 (six) hours as needed for moderate pain., Disp: 30 tablet, Rfl: 0 .  Travoprost, BAK Free, (TRAVATAN) 0.004 % SOLN ophthalmic solution, PLACE 1 DROP INTO BOTH EYES NIGHTLY, Disp: , Rfl:  .  Vitamin D, Ergocalciferol, (DRISDOL) 50000 UNITS CAPS, Take 50,000 Units by mouth 2 (two) times a week. Sunday & Thursday, Disp: , Rfl:   Allergies  Allergen Reactions  . Amlodipine Cough  . Bydureon [Exenatide] Itching and Other (See Comments)    KNOTS IN SKIN  . Lisinopril Cough  . Lunesta [Eszopiclone] Other (See Comments)    Metallic taste in mouth  . Sulfa Antibiotics Rash   Review of  Systems Objective:  There were no vitals filed for this visit.  General: Well developed, nourished, in no acute distress, alert and oriented x3   Dermatological: Skin is warm, dry and supple bilateral. Nails x 10 are well maintained; remaining integument appears unremarkable at this time. There are no open sores, no preulcerative lesions, no rash or signs of infection present.  Reactive hyperkeratotic lesion medial aspect fifth digit right foot secondary to adductovarus rotation of the toe.  Vascular: Dorsalis Pedis artery and Posterior Tibial artery pedal pulses are 2/4 bilateral with immedate capillary fill time. Pedal hair growth present. No varicosities and no lower extremity edema present bilateral.   Neruologic: Grossly intact via light touch bilateral. Vibratory intact via tuning fork bilateral. Protective threshold with Semmes Wienstein monofilament intact to all pedal sites bilateral. Patellar and Achilles deep tendon reflexes 2+ bilateral. No Babinski or clonus noted bilateral.   Musculoskeletal: No gross boney pedal deformities bilateral. No pain, crepitus, or limitation noted with foot and ankle range of motion bilateral. Muscular strength 5/5 in all groups tested bilateral.  No crepitation on range of motion of the ankle joint.  No reproducible tenderness.  Ankle joint subtalar joint full range of motion.  Gait: Unassisted, Nonantalgic.    Radiographs:  Radiographs taken today of the ankle demonstrate an osseously mature ankle with some joint space narrowing retrocalcaneal heel spur thickening of the Achilles tendon  Assessment & Plan:   Assessment: Adductovarus rotation fifth digit right foot resulting in painful callus medial aspect of the toe.  Osteoarthritic changes ankle joint and subtalar joint right.  Plan: Discussed etiology pathology conservative versus surgical therapies at this point I went ahead and debrided the reactive hyper keratoma and placed padding.  Discussed  the need for surgical intervention.  She will follow up with [place] for ankle pain also if necessary.     Tionne Carelli T. [place], [place]

## 2021-03-07 ENCOUNTER — Other Ambulatory Visit: Payer: Self-pay

## 2021-03-07 ENCOUNTER — Encounter: Payer: Self-pay | Admitting: Internal Medicine

## 2021-03-07 ENCOUNTER — Ambulatory Visit: Payer: Medicare Other | Admitting: Internal Medicine

## 2021-03-07 VITALS — BP 142/80 | HR 82 | Ht <= 58 in | Wt 210.2 lb

## 2021-03-07 DIAGNOSIS — Z8249 Family history of ischemic heart disease and other diseases of the circulatory system: Secondary | ICD-10-CM

## 2021-03-07 DIAGNOSIS — R072 Precordial pain: Secondary | ICD-10-CM | POA: Diagnosis not present

## 2021-03-07 DIAGNOSIS — I10 Essential (primary) hypertension: Secondary | ICD-10-CM

## 2021-03-07 DIAGNOSIS — Z01812 Encounter for preprocedural laboratory examination: Secondary | ICD-10-CM

## 2021-03-07 MED ORDER — METOPROLOL TARTRATE 100 MG PO TABS: ORAL_TABLET | ORAL | 0 refills | Status: DC

## 2021-03-07 NOTE — Patient Instructions (Signed)
Medication Instructions:  Your physician recommends that you continue on your current medications as directed. Please refer to the Current Medication list given to you today.  *If you need a refill on your cardiac medications before your next appointment, please call your pharmacy*   Lab Work: BMET - complete prior to CT test  If you have labs (blood work) drawn today and your tests are completely normal, you will receive your results only by: Marland Kitchen MyChart Message (if you have MyChart) OR . A paper copy in the mail If you have any lab test that is abnormal or we need to change your treatment, we will call you to review the results.   Testing/Procedures: Cardiac CT @ [facility] -- once approved with insurance, you will get a call to arrange your test   Follow-Up: At [facility], you and your health needs are our priority.  As part of our continuing mission to provide you with exceptional heart care, we have created designated Provider Care Teams.  These Care Teams include your primary Cardiologist (physician) and Advanced Practice Providers (APPs -  Physician Assistants and Nurse Practitioners) who all work together to provide you with the care you need, when you need it.  We recommend signing up for the patient portal called "MyChart".  Sign up information is provided on this After Visit Summary.  MyChart is used to connect with patients for Virtual Visits (Telemedicine).  Patients are able to view lab/test results, encounter notes, upcoming appointments, etc.  Non-urgent messages can be sent to your provider as well.   To learn more about what you can do with MyChart, go to [email or web address].    Your next appointment:   6-8 week(s)  The format for your next appointment:   In Person  Provider:   You may see Dr. Debara Pickett or one of the following Advanced Practice Providers on your designated Care Team:    Almyra Deforest, PA-C  Fabian Sharp, PA-C or   Roby Lofts,  [place]    Other Instructions Cardiac CT Instructions:  Your cardiac CT will be scheduled at one of the below locations:   [facility] [address] [place], [place] [address] [phone]  [place] [address] [place], [place] [address] [phone]  If scheduled at [facility], please arrive at the [facility] main entrance (entrance A) of [facility] 30 minutes prior to test start time. Proceed to the [facility] Radiology Department (first floor) to check-in and test prep.  If scheduled at [facility], please arrive 15 mins early for check-in and test prep.  Please follow these instructions carefully (unless otherwise directed):   On the Night Before the Test: . Be sure to Drink plenty of water. . Do not consume any caffeinated/decaffeinated beverages or chocolate 12 hours prior to your test. . Do not take any antihistamines 12 hours prior to your test.  On the Day of the Test: . Drink plenty of water until 1 hour prior to the test. . Do not eat any food 4 hours prior to the test. . You may take your regular medications prior to the test.  . Take metoprolol (Lopressor) two hours prior to test. . HOLD Furosemide/Hydrochlorothiazide morning of the test. . FEMALES- please wear underwire-free bra if available      After the Test: . Drink plenty of water. . After receiving IV contrast, you may experience a mild flushed feeling.  This is normal. . On occasion, you may experience a mild rash up to 24 hours after the test. This is not dangerous. If this occurs, you can take Benadryl 25 mg and increase your fluid intake. . If you experience trouble breathing, this can be serious. If it is severe call 911 IMMEDIATELY. If it is mild, please call our office. . If you take any of these medications: Glipizide/Metformin, Avandament, Glucavance, please do not take 48 hours  after completing test unless otherwise instructed.   Once we have confirmed authorization from your insurance company, we will call you to set up a date and time for your test. Based on how quickly your insurance processes prior authorizations requests, please allow up to 4 weeks to be contacted for scheduling your Cardiac CT appointment. Be advised that routine Cardiac CT appointments could be scheduled as many as 8 weeks after your provider has ordered it.  For non-scheduling related questions, please contact the cardiac imaging nurse navigator should you have any questions/concerns: Marchia Bond, Cardiac Imaging Nurse Navigator Gordy Clement, Cardiac Imaging Nurse Navigator [facility] Direct Office Dial: [phone]   For scheduling needs, including cancellations and rescheduling, please call [place], [phone].

## 2021-03-07 NOTE — Progress Notes (Signed)
OFFICE CONSULT NOTE  Chief Complaint:  Chest pain  Primary Care Physician: Crist Infante, MD  HPI:  Shawna Gonzalez is a 70 y.o. female who is being seen today for the evaluation of chest pain at the request of Crist Infante, MD.  This is a pleasant 70 year old female kindly referred by Dr. Haynes Kerns for evaluation and management of chest pain.  She had seen Dr. Aldona Bar number of years ago and underwent stress testing in 2013 which was negative for ischemia.  She then saw Dr. Alvester Chou in 2017 for what sounded like atypical chest pain.  He did not pursue any further ischemia evaluation.  She does have family history of heart disease including her mother who had had a heart attack in her 29s.  Her father also had some heart problems.  Other risk factors include hypertension and obesity.  She also has a type 2 diabetes which appears to be controlled with diet.  She is describing some recent onset chest discomfort.  More notably she has right anterior chest pain just to the right of midline in the sternum.  Sometimes it goes across her chest or comes up the sternum but is difficult for her to differentiate that between her reflux symptoms.  She says is not always worse with exertion or relieved by rest.  She does get some shortness of breath with exertion.  PMHx:  Past Medical History:  Diagnosis Date  . Adenomatous colon polyp   . Anemia    history of  . Arthritis   . Asthma   . Atypical chest pain   . Bacterial ear infection 2018  . Cancer ([place])    melanoma skin cancer 7 years ago  . Cataract   . Diabetes mellitus without complication ([place])    history of taking diabetic medication, has lost weight no longer needing medciation at this time  . GERD (gastroesophageal reflux disease)   . Glaucoma   . Heart murmur    slight  . Hypertension   . Macular degeneration   . Osteoporosis   . Sleep apnea    cpap  . Ulcerative colitis, chronic ([place])     Past Surgical History:  Procedure  Laterality Date  . CARPAL TUNNEL RELEASE Right 12/16/2009  . CATARACT EXTRACTION W/ INTRAOCULAR LENS  IMPLANT, BILATERAL Bilateral 05/21/2007(Right); 06/04/2007(Left)  . CHOLECYSTECTOMY  2000  . COLONOSCOPY WITH PROPOFOL  01/23/2012  . DE QUERVAIN'S RELEASE Right 11/30/2005  . HYSTERECTOMY ABDOMINAL WITH SALPINGECTOMY Bilateral 01/09/2017   Procedure: HYSTERECTOMY ABDOMINAL WITH SALPINGOOPHORECTOMY;  Surgeon: Dian Queen, MD;  Location: [facility];  Service: Gynecology;  Laterality: Bilateral;  . TONSILLECTOMY    . UPPER GI ENDOSCOPY  03/05/2017   MAC    FAMHx:  Family History  Problem Relation Age of Onset  . Colon polyps Mother   . Heart disease Mother   . Colon polyps Father   . Colon polyps Brother   . Colon cancer Neg Hx   . Esophageal cancer Neg Hx   . Stomach cancer Neg Hx     SOCHx:   reports that she has never smoked. She has never used smokeless tobacco. She reports that she does not drink alcohol and does not use drugs.  ALLERGIES:  Allergies  Allergen Reactions  . Amlodipine Cough  . Bydureon [Exenatide] Itching and Other (See Comments)    KNOTS IN SKIN  . Lisinopril Cough  . Lunesta [Eszopiclone] Other (See Comments)    Metallic taste in mouth  . Sulfa  Antibiotics Rash    ROS: Pertinent items noted in HPI and remainder of comprehensive ROS otherwise negative.  HOME MEDS: Current Outpatient Medications on File Prior to Visit  Medication Sig Dispense Refill  . albuterol (PROVENTIL HFA;VENTOLIN HFA) 108 (90 BASE) MCG/ACT inhaler Inhale 2 puffs into the lungs every 6 (six) hours as needed for wheezing or shortness of breath.    . budesonide-formoterol (SYMBICORT) 160-4.5 MCG/ACT inhaler Inhale 2 puffs into the lungs daily.    Marland Kitchen dicyclomine (BENTYL) 10 MG capsule Take 1 capsule (10 mg total) by mouth 4 (four) times daily -  before meals and at bedtime. 90 capsule 0  . dorzolamide (TRUSOPT) 2 % ophthalmic solution INSTILL 1 DROP INTO BOTH EYES THREE TIMES DAILY 30  mL 0  . esomeprazole (NEXIUM) 40 MG capsule Take 40 mg by mouth 2 (two) times daily.    . furosemide (LASIX) 40 MG tablet Take 40 mg by mouth every evening.   4  . ibuprofen (ADVIL,MOTRIN) 600 MG tablet Take 1 tablet (600 mg total) by mouth every 6 (six) hours as needed (mild pain). 60 tablet 0  . levocetirizine (XYZAL) 5 MG tablet Take 5 mg by mouth every evening.    Marland Kitchen losartan (COZAAR) 25 MG tablet Take 25 mg by mouth daily.    . Multiple Vitamins-Minerals (MULTIVITAMIN GUMMIES ADULT) CHEW Chew 2 tablets by mouth every evening.    . potassium chloride SA (K-DUR,KLOR-CON) 20 MEQ tablet Take 20 mEq by mouth every Tuesday, Thursday, and Saturday at 6 PM.     . traMADol (ULTRAM) 50 MG tablet Take 1 tablet (50 mg total) by mouth every 6 (six) hours as needed for moderate pain. 30 tablet 0  . Travoprost, BAK Free, (TRAVATAN) 0.004 % SOLN ophthalmic solution PLACE 1 DROP INTO BOTH EYES NIGHTLY    . Vitamin D, Ergocalciferol, (DRISDOL) 50000 UNITS CAPS Take 50,000 Units by mouth 2 (two) times a week. Sunday & Thursday     No current facility-administered medications on file prior to visit.    LABS/IMAGING: No results found for this or any previous visit (from the past 48 hour(s)). No results found.  LIPID PANEL: No results found for: CHOL, TRIG, HDL, CHOLHDL, VLDL, LDLCALC, LDLDIRECT  WEIGHTS: Wt Readings from Last 3 Encounters:  03/07/21 210 lb 3.2 oz (95.3 kg)  02/01/21 210 lb (95.3 kg)  05/24/20 196 lb (88.9 kg)    VITALS: BP (!) 142/80 (BP Location: Right Arm, Patient Position: Sitting, Cuff Size: Normal)   Pulse 82   Ht _0  (1.473 m)   Wt 210 lb 3.2 oz (95.3 kg)   SpO2 96%   BMI 43.93 kg/m   EXAM: General appearance: alert, no distress and morbidly obese Neck: no carotid bruit, no JVD and thyroid not enlarged, symmetric, no tenderness/mass/nodules Lungs: clear to auscultation bilaterally Heart: regular rate and rhythm, S1, S2 normal, no murmur, click, rub or  gallop Abdomen: soft, non-tender; bowel sounds normal; no masses,  no organomegaly and Obese Extremities: extremities normal, atraumatic, no cyanosis or edema Pulses: 2+ and symmetric Skin: Skin color, texture, turgor normal. No rashes or lesions Neurologic: Grossly normal Psych: Pleasant  EKG: Normal sinus rhythm 82- personally reviewed  ASSESSMENT: 1. Atypical chest pain 2. Family history of premature coronary disease 3. Morbid obesity 4. Diet-controlled diabetes 5. Hyper tension  PLAN: 1.   Ms. Teagarden is describing what sound like atypical chest pain.  She had an ischemic evaluation in 2013 which was negative.  She has had  similar symptoms in the past but has not had any further work-up.  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  I suspect that this is not likely ischemia as his symptoms are not always associated with exertion.  Nevertheless will be helpful to rule out any significant coronary disease given her family history of early onset heart disease and some risk factors.  I did advise CT coronary angiography.  Although she is morbidly obese, the extent of her weight is carried truncally, therefore I suspect the CT could be performed of her heart with less artifact.  She will likely need 100 mg of metoprolol for heart rate control before the procedure as well as nitroglycerin.  We discussed the risk benefits and alternatives to CT coronary angiography and she is agreeable to proceed.  FFR will be performed as necessary.  Plan follow-up with me afterwards to discuss the findings.  Thanks as always for the kind referral.  Pixie Casino, MD, FACC, [place] Director of the Advanced Lipid Disorders &  Cardiovascular Risk Reduction Clinic Diplomate of the American Board of Clinical Lipidology Attending Cardiologist  Direct Dial: [phone]  Fax: [phone]  Website:  [email or web address]  Dabney Schanz 03/07/2021, 4:24 PM

## 2021-03-16 LAB — BASIC METABOLIC PANEL
BUN/Creatinine Ratio: 21 (ref 12–28)
BUN: 14 mg/dL (ref 8–27)
CO2: 22 mmol/L (ref 20–29)
Calcium: 8.5 mg/dL — ABNORMAL LOW (ref 8.7–10.3)
Chloride: 103 mmol/L (ref 96–106)
Creatinine, Ser: 0.66 mg/dL (ref 0.57–1.00)
Glucose: 115 mg/dL — ABNORMAL HIGH (ref 65–99)
Potassium: 3.7 mmol/L (ref 3.5–5.2)
Sodium: 142 mmol/L (ref 134–144)
eGFR: 95 mL/min/{1.73_m2} (ref >59)

## 2021-03-21 ENCOUNTER — Telehealth (HOSPITAL_COMMUNITY): Payer: Self-pay | Admitting: *Deleted

## 2021-03-21 NOTE — Telephone Encounter (Signed)
Reaching out to patient to offer assistance regarding upcoming cardiac imaging study; pt verbalizes understanding of appt date/time, parking situation and where to check in, pre-test NPO status and medications ordered, and verified current allergies; name and call back number provided for further questions should they arise  Jaevon Paras RN Navigator Cardiac Imaging [facility] [phone] office [phone] cell  

## 2021-03-22 ENCOUNTER — Other Ambulatory Visit: Payer: Self-pay

## 2021-03-22 ENCOUNTER — Encounter: Payer: Medicare Other | Admitting: *Deleted

## 2021-03-22 ENCOUNTER — Ambulatory Visit (HOSPITAL_COMMUNITY)
Admission: RE | Admit: 2021-03-22 | Discharge: 2021-03-22 | Disposition: A | Discharge status: Home / Self Care | Payer: Medicare Other | Source: Ambulatory Visit | Attending: Internal Medicine | Admitting: Internal Medicine

## 2021-03-22 DIAGNOSIS — Z006 Encounter for examination for normal comparison and control in clinical research program: Secondary | ICD-10-CM

## 2021-03-22 DIAGNOSIS — I10 Essential (primary) hypertension: Secondary | ICD-10-CM | POA: Insufficient documentation

## 2021-03-22 DIAGNOSIS — R072 Precordial pain: Secondary | ICD-10-CM | POA: Insufficient documentation

## 2021-03-22 DIAGNOSIS — Z8249 Family history of ischemic heart disease and other diseases of the circulatory system: Secondary | ICD-10-CM | POA: Diagnosis present

## 2021-03-22 MED ORDER — IOHEXOL 350 MG/ML SOLN
95.0000 mL | Freq: Once | INTRAVENOUS | Status: AC | PRN
  Administered 2021-03-22: 95 mL via INTRAVENOUS

## 2021-03-22 MED ORDER — NITROGLYCERIN 0.4 MG SL SUBL
SUBLINGUAL_TABLET | SUBLINGUAL | Status: AC
  Filled 2021-03-22: qty 2

## 2021-03-22 MED ORDER — NITROGLYCERIN 0.4 MG SL SUBL
0.8000 mg | SUBLINGUAL_TABLET | Freq: Once | SUBLINGUAL | Status: AC
  Administered 2021-03-22: 0.8 mg via SUBLINGUAL

## 2021-03-22 NOTE — Research (Signed)
IDENTIFY Informed Consent                  Subject Name:   Shawna Gonzalez   Subject met inclusion and exclusion criteria.  The informed consent form, study requirements and expectations were reviewed with the subject and questions and concerns were addressed prior to the signing of the consent form.  The subject verbalized understanding of the trial requirements.  The subject agreed to participate in the IDENTIFY trial and signed the informed consent.  The informed consent was obtained prior to performance of any protocol-specific procedures for the subject.  A copy of the signed informed consent was given to the subject and a copy was placed in the subject's medical record.   [place] Vash Quezada, Research Assistant  03/22/2021  09:12 a.m.

## 2021-05-03 ENCOUNTER — Other Ambulatory Visit: Payer: Self-pay

## 2021-05-03 ENCOUNTER — Ambulatory Visit: Payer: Medicare Other | Admitting: Internal Medicine

## 2021-05-03 ENCOUNTER — Encounter: Payer: Self-pay | Admitting: Internal Medicine

## 2021-05-03 VITALS — BP 142/76 | HR 84 | Ht <= 58 in | Wt 210.0 lb

## 2021-05-03 DIAGNOSIS — I10 Essential (primary) hypertension: Secondary | ICD-10-CM | POA: Diagnosis not present

## 2021-05-03 DIAGNOSIS — E119 Type 2 diabetes mellitus without complications: Secondary | ICD-10-CM | POA: Diagnosis not present

## 2021-05-03 DIAGNOSIS — Z8249 Family history of ischemic heart disease and other diseases of the circulatory system: Secondary | ICD-10-CM

## 2021-05-03 DIAGNOSIS — R072 Precordial pain: Secondary | ICD-10-CM | POA: Diagnosis not present

## 2021-05-03 NOTE — Patient Instructions (Signed)

## 2021-05-03 NOTE — Progress Notes (Signed)
OFFICE CONSULT NOTE  Chief Complaint:  Chest pain  Primary Care Physician: Crist Infante, MD  HPI:  Shawna Gonzalez is a 70 y.o. female who is being seen today for the evaluation of chest pain at the request of Crist Infante, MD.  This is a pleasant 70 year old female kindly referred by Dr. Haynes Kerns for evaluation and management of chest pain.  She had seen Dr. Aldona Bar number of years ago and underwent stress testing in 2013 which was negative for ischemia.  She then saw Dr. Alvester Chou in 2017 for what sounded like atypical chest pain.  He did not pursue any further ischemia evaluation.  She does have family history of heart disease including her mother who had had a heart attack in her 48s.  Her father also had some heart problems.  Other risk factors include hypertension and obesity.  She also has a type 2 diabetes which appears to be controlled with diet.  She is describing some recent onset chest discomfort.  More notably she has right anterior chest pain just to the right of midline in the sternum.  Sometimes it goes across her chest or comes up the sternum but is difficult for her to differentiate that between her reflux symptoms.  She says is not always worse with exertion or relieved by rest.  She does get some shortness of breath with exertion.  05/03/2021  Shawna Gonzalez returns today for follow-up.  She underwent CT coronary angiography.  No coronary calcium with a normal aortic root diameter and normal right dominant coronary arteries.  There was hepatic steatosis.  Overall very reassuring finding.  I think a lot of her symptoms may be due to the significant stress she is under caring for her mother.  PMHx:  Past Medical History:  Diagnosis Date  . Adenomatous colon polyp   . Anemia    history of  . Arthritis   . Asthma   . Atypical chest pain   . Bacterial ear infection 2018  . Cancer ([place])    melanoma skin cancer 7 years ago  . Cataract   . Diabetes mellitus without  complication ([place])    history of taking diabetic medication, has lost weight no longer needing medciation at this time  . GERD (gastroesophageal reflux disease)   . Glaucoma   . Heart murmur    slight  . Hypertension   . Macular degeneration   . Osteoporosis   . Sleep apnea    cpap  . Ulcerative colitis, chronic ([place])     Past Surgical History:  Procedure Laterality Date  . CARPAL TUNNEL RELEASE Right 12/16/2009  . CATARACT EXTRACTION W/ INTRAOCULAR LENS  IMPLANT, BILATERAL Bilateral 05/21/2007(Right); 06/04/2007(Left)  . CHOLECYSTECTOMY  2000  . COLONOSCOPY WITH PROPOFOL  01/23/2012  . DE QUERVAIN'S RELEASE Right 11/30/2005  . HYSTERECTOMY ABDOMINAL WITH SALPINGECTOMY Bilateral 01/09/2017   Procedure: HYSTERECTOMY ABDOMINAL WITH SALPINGOOPHORECTOMY;  Surgeon: Dian Queen, MD;  Location: [facility];  Service: Gynecology;  Laterality: Bilateral;  . TONSILLECTOMY    . UPPER GI ENDOSCOPY  03/05/2017   MAC    FAMHx:  Family History  Problem Relation Age of Onset  . Colon polyps Mother   . Heart disease Mother   . Colon polyps Father   . Colon polyps Brother   . Colon cancer Neg Hx   . Esophageal cancer Neg Hx   . Stomach cancer Neg Hx     SOCHx:   reports that she has never smoked. She has never  used smokeless tobacco. She reports that she does not drink alcohol and does not use drugs.  ALLERGIES:  Allergies  Allergen Reactions  . Amlodipine Cough  . Bydureon [Exenatide] Itching and Other (See Comments)    KNOTS IN SKIN  . Lisinopril Cough  . Lunesta [Eszopiclone] Other (See Comments)    Metallic taste in mouth  . Sulfa Antibiotics Rash    ROS: Pertinent items noted in HPI and remainder of comprehensive ROS otherwise negative.  HOME MEDS: Current Outpatient Medications on File Prior to Visit  Medication Sig Dispense Refill  . albuterol (PROVENTIL HFA;VENTOLIN HFA) 108 (90 BASE) MCG/ACT inhaler Inhale 2 puffs into the lungs every 6 (six) hours as needed for  wheezing or shortness of breath.    . budesonide-formoterol (SYMBICORT) 160-4.5 MCG/ACT inhaler Inhale 2 puffs into the lungs daily.    Marland Kitchen dicyclomine (BENTYL) 10 MG capsule Take 1 capsule (10 mg total) by mouth 4 (four) times daily -  before meals and at bedtime. 90 capsule 0  . dorzolamide (TRUSOPT) 2 % ophthalmic solution INSTILL 1 DROP INTO BOTH EYES THREE TIMES DAILY 30 mL 0  . esomeprazole (NEXIUM) 40 MG capsule Take 40 mg by mouth 2 (two) times daily.    . furosemide (LASIX) 40 MG tablet Take 40 mg by mouth every evening.   4  . ibuprofen (ADVIL,MOTRIN) 600 MG tablet Take 1 tablet (600 mg total) by mouth every 6 (six) hours as needed (mild pain). 60 tablet 0  . levocetirizine (XYZAL) 5 MG tablet Take 5 mg by mouth every evening.    Marland Kitchen losartan (COZAAR) 25 MG tablet Take 25 mg by mouth daily.    . Multiple Vitamins-Minerals (MULTIVITAMIN GUMMIES ADULT) CHEW Chew 2 tablets by mouth every evening.    . potassium chloride SA (K-DUR,KLOR-CON) 20 MEQ tablet Take 20 mEq by mouth every Tuesday, Thursday, and Saturday at 6 PM.     . traMADol (ULTRAM) 50 MG tablet Take 1 tablet (50 mg total) by mouth every 6 (six) hours as needed for moderate pain. 30 tablet 0  . Travoprost, BAK Free, (TRAVATAN) 0.004 % SOLN ophthalmic solution PLACE 1 DROP INTO BOTH EYES NIGHTLY    . Vitamin D, Ergocalciferol, (DRISDOL) 50000 UNITS CAPS Take 50,000 Units by mouth 2 (two) times a week. Sunday & Thursday     No current facility-administered medications on file prior to visit.    LABS/IMAGING: No results found for this or any previous visit (from the past 48 hour(s)). No results found.  LIPID PANEL: No results found for: CHOL, TRIG, HDL, CHOLHDL, VLDL, LDLCALC, LDLDIRECT  WEIGHTS: Wt Readings from Last 3 Encounters:  05/03/21 210 lb (95.3 kg)  03/07/21 210 lb 3.2 oz (95.3 kg)  02/01/21 210 lb (95.3 kg)    VITALS: BP (!) 142/76 (BP Location: Left Arm, Patient Position: Sitting, Cuff Size: Normal)   Pulse 84    Ht _0  (1.473 m)   Wt 210 lb (95.3 kg)   BMI 43.89 kg/m   EXAM: Deferred  EKG: Deferred  ASSESSMENT: 1. Atypical chest pain -0 coronary calcium and no detectable coronary disease by CT coronary angiography (03/22/2021) 2. Family history of premature coronary disease 3. Morbid obesity 4. Diet-controlled diabetes 5. Hypertension 6. Hepatic steatosis  PLAN: 1.   Shawna Gonzalez fortunately was not found to have any coronary calcium or detectable noncalcified coronary disease by CT coronary angiography.  There is some hepatic steatosis and she will need to continue to work on weight loss  and risk factor modification.  She reports she still gets some symptoms occasionally and may be related to stress.  I have encouraged her to follow-up with her PCP.  I am happy to see her back annually or sooner as necessary.  Pixie Casino, MD, [facility], [place] Director of the Advanced Lipid Disorders &  Cardiovascular Risk Reduction Clinic Diplomate of the American Board of Clinical Lipidology Attending Cardiologist  Direct Dial: [phone]  Fax: [phone]  Website:  [email or web address] 05/03/2021, 11:46 AM

## 2021-07-14 ENCOUNTER — Telehealth: Payer: Self-pay

## 2021-07-14 DIAGNOSIS — Z006 Encounter for examination for normal comparison and control in clinical research program: Secondary | ICD-10-CM

## 2021-07-14 NOTE — Telephone Encounter (Signed)
Called patient for 90 day Identify phone call pt stated she is not having anymore cardiac symptoms but does have a follow up with PCP, I reminded the patient that we would be calling her back around April for a year follow up phone call.

## 2021-09-09 ENCOUNTER — Ambulatory Visit: Payer: Medicare Other | Admitting: Gastroenterology

## 2021-09-09 ENCOUNTER — Other Ambulatory Visit (INDEPENDENT_AMBULATORY_CARE_PROVIDER_SITE_OTHER): Payer: Medicare Other

## 2021-09-09 ENCOUNTER — Encounter: Payer: Self-pay | Admitting: Gastroenterology

## 2021-09-09 VITALS — BP 110/74 | HR 87 | Ht <= 58 in | Wt 202.0 lb

## 2021-09-09 DIAGNOSIS — R1011 Right upper quadrant pain: Secondary | ICD-10-CM

## 2021-09-09 DIAGNOSIS — R1084 Generalized abdominal pain: Secondary | ICD-10-CM | POA: Diagnosis not present

## 2021-09-09 DIAGNOSIS — K582 Mixed irritable bowel syndrome: Secondary | ICD-10-CM | POA: Diagnosis not present

## 2021-09-09 DIAGNOSIS — K219 Gastro-esophageal reflux disease without esophagitis: Secondary | ICD-10-CM | POA: Diagnosis not present

## 2021-09-09 LAB — BASIC METABOLIC PANEL
BUN: 13 mg/dL (ref 6–23)
CO2: 28 mEq/L (ref 19–32)
Calcium: 8.8 mg/dL (ref 8.4–10.5)
Chloride: 104 mEq/L (ref 96–112)
Creatinine, Ser: 0.7 mg/dL (ref 0.40–1.20)
GFR: 88.1 mL/min (ref >60.00)
Glucose, Bld: 125 mg/dL — ABNORMAL HIGH (ref 70–99)
Potassium: 3.5 mEq/L (ref 3.5–5.1)
Sodium: 141 mEq/L (ref 135–145)

## 2021-09-09 MED ORDER — DICYCLOMINE HCL 10 MG PO CAPS: 10.0000 mg | ORAL_CAPSULE | Freq: Three times a day (TID) | ORAL | 2 refills | Status: DC

## 2021-09-09 NOTE — Patient Instructions (Addendum)
You have been scheduled for a CT scan of the abdomen and pelvis at [facility] ([address]---this is in the same building as [employer]).   You are scheduled on 09/13/2021 at [facility] should arrive 15 minutes prior to your appointment time for registration. Please follow the written instructions below on the day of your exam:  WARNING: IF YOU ARE ALLERGIC TO IODINE/X-RAY DYE, PLEASE NOTIFY RADIOLOGY IMMEDIATELY AT [phone]! YOU WILL BE GIVEN A 13 HOUR PREMEDICATION PREP.  1) Do not eat or drink anything after 7am (4 hours prior to your test) 2) You have been given 2 bottles of oral contrast to drink. The solution may taste better if refrigerated, but do NOT add ice or any other liquid to this solution. Shake well before drinking.    Drink 1 bottle of contrast @ __9am____ (2 hours prior to your exam)  Drink 1 bottle of contrast @ ___10am___ (1 hour prior to your exam)  You may take any medications as prescribed with a small amount of water, if necessary. If you take any of the following medications: METFORMIN, GLUCOPHAGE, GLUCOVANCE, AVANDAMET, RIOMET, FORTAMET, [place] MET, JANUMET, GLUMETZA or METAGLIP, you MAY be asked to HOLD this medication 48 hours AFTER the exam.  The purpose of you drinking the oral contrast is to aid in the visualization of your intestinal tract. The contrast solution may cause some diarrhea. Depending on your individual set of symptoms, you may also receive an intravenous injection of x-ray contrast/dye. Plan on being at [facility] for 30 minutes or longer, depending on the type of exam you are having performed.  This test typically takes 30-45 minutes to complete.  If you have any questions regarding your exam or if you need to reschedule, you may call the CT department at [phone] between the hours of 8:00 am and 5:00 pm, Monday-Friday.  Your provider has requested that you go to the basement level for lab work before  leaving today. Press "B" on the elevator. The lab is located at the first door on the left as you exit the elevator.   Dr Silverio Decamp recommends that you complete a bowel purge (to clean out your bowels). Please do the following: Purchase a bottle of Miralax over the counter as well as a box of 5 mg dulcolax tablets. Take 4 dulcolax tablets. Wait 1 hour. You will then drink 6-8 capfuls of Miralax mixed in an adequate amount of water/juice/gatorade (you may choose which of these liquids to drink) over the next 2-3 hours. You should expect results within 1 to 6 hours after completing the bowel purge.   Continue Nexium   Use Dicyclomine every 8 hours as needed  Due to recent changes in healthcare laws, you may see the results of your imaging and laboratory studies on MyChart before your provider has had a chance to review them.  We understand that in some cases there may be results that are confusing or concerning to you. Not all laboratory results come back in the same time frame and the provider may be waiting for multiple results in order to interpret others.  Please give [place] 48 hours in order for your provider to thoroughly review all the results before contacting the office for clarification of your results.    I appreciate the  opportunity to care for you  Thank You   Harl Bowie , MD   ________________________________________________________________________

## 2021-09-09 NOTE — Progress Notes (Signed)
Shawna Gonzalez    [id number]    [birth year]  Primary Care Physician:Perini, Elta Guadeloupe, MD  Referring Physician: Crist Infante, [place],  [place] [address]   Chief complaint: Abdominal discomfort  HPI:  70 year old female with history of H. pylori gastritis status post Pylera therapy with eradication in 2017, chronic GERD with complaints of nausea, abdominal pain, bloating and excessive belching   She is having intermittent lower abdominal discomfort, worse on the left side.  She is trying to alter her diet and is trying to lose weight Nexium once a day on most days but has to take it twice a day about 10 to 15 days in the month. Nausea but no vomiting.  No dysphagia, odynophagia, melena or blood per rectum.  She has excessive bloating and generalized abdominal discomfort. Intermittent diarrhea, worse with certain foods  Colonoscopy 05/24/2020: - Three 5 to 9 mm polyps in the descending colon, in the transverse colon and in the ascending colon, removed with a cold snare. Resected and retrieved. - One less than 1 mm polyp in the rectum, removed with a cold biopsy forceps. Resected and retrieved. - Moderate diverticulosis in the sigmoid colon, in the descending colon, in the transverse colon, in the ascending colon and in the cecum. Purulent discharge was seen in association with the diverticular opening, suspicious of diverticulitis. Biopsied.   Colonoscopy January 23, 2012 by Dr. Olevia Perches diverticulosis with removal of 2 hyperplastic polyps   Outpatient Encounter Medications as of 09/09/2021  Medication Sig   albuterol (PROVENTIL HFA;VENTOLIN HFA) 108 (90 BASE) MCG/ACT inhaler Inhale 2 puffs into the lungs every 6 (six) hours as needed for wheezing or shortness of breath.   budesonide-formoterol (SYMBICORT) 160-4.5 MCG/ACT inhaler Inhale 2 puffs into the lungs daily.   dorzolamide (TRUSOPT) 2 % ophthalmic solution INSTILL 1 DROP INTO BOTH EYES THREE TIMES  DAILY   esomeprazole (NEXIUM) 40 MG capsule Take 40 mg by mouth 2 (two) times daily.   furosemide (LASIX) 40 MG tablet Take 40 mg by mouth every evening.    levocetirizine (XYZAL) 5 MG tablet Take 5 mg by mouth every evening.   losartan (COZAAR) 25 MG tablet Take 25 mg by mouth daily.   potassium chloride SA (K-DUR,KLOR-CON) 20 MEQ tablet Take 20 mEq by mouth every Tuesday, Thursday, and Saturday at 6 PM.    traMADol (ULTRAM) 50 MG tablet Take 1 tablet (50 mg total) by mouth every 6 (six) hours as needed for moderate pain.   Travoprost, BAK Free, (TRAVATAN) 0.004 % SOLN ophthalmic solution PLACE 1 DROP INTO BOTH EYES NIGHTLY   Vitamin D, Ergocalciferol, (DRISDOL) 50000 UNITS CAPS Take 50,000 Units by mouth 2 (two) times a week. Sunday & Thursday   dicyclomine (BENTYL) 10 MG capsule Take 1 capsule (10 mg total) by mouth 4 (four) times daily -  before meals and at bedtime.   ibuprofen (ADVIL,MOTRIN) 600 MG tablet Take 1 tablet (600 mg total) by mouth every 6 (six) hours as needed (mild pain).   Multiple Vitamins-Minerals (MULTIVITAMIN GUMMIES ADULT) CHEW Chew 2 tablets by mouth every evening.   No facility-administered encounter medications on file as of 09/09/2021.    Allergies as of 09/09/2021 - Review Complete 09/09/2021  Allergen Reaction Noted   Amlodipine Cough 08/03/2015   Bydureon [exenatide] Itching and Other (See Comments) 08/03/2015   Lisinopril Cough 08/03/2015   Lunesta [eszopiclone] Other (See Comments) 08/03/2015   Sulfa antibiotics Rash 01/09/2012  Past Medical History:  Diagnosis Date   Adenomatous colon polyp    Anemia    history of   Arthritis    Asthma    Atypical chest pain    Bacterial ear infection 2018   Cancer ([place])    melanoma skin cancer 7 years ago   Cataract    Diabetes mellitus without complication ([place])    history of taking diabetic medication, has lost weight no longer needing medciation at this time   GERD (gastroesophageal reflux disease)     Glaucoma    Heart murmur    slight   Hypertension    Macular degeneration    Osteoporosis    Sleep apnea    cpap   Ulcerative colitis, chronic ([place])     Past Surgical History:  Procedure Laterality Date   CARPAL TUNNEL RELEASE Right 12/16/2009   CATARACT EXTRACTION W/ INTRAOCULAR LENS  IMPLANT, BILATERAL Bilateral 05/21/2007(Right); 06/04/2007(Left)   CHOLECYSTECTOMY  2000   COLONOSCOPY WITH PROPOFOL  01/23/2012   DE QUERVAIN'S RELEASE Right 11/30/2005   HYSTERECTOMY ABDOMINAL WITH SALPINGECTOMY Bilateral 01/09/2017   Procedure: HYSTERECTOMY ABDOMINAL WITH SALPINGOOPHORECTOMY;  Surgeon: Dian Queen, MD;  Location: [facility];  Service: Gynecology;  Laterality: Bilateral;   TONSILLECTOMY     UPPER GI ENDOSCOPY  03/05/2017   MAC    Family History  Problem Relation Age of Onset   Colon polyps Mother    Heart disease Mother    Colon polyps Father    Colon polyps Brother    Colon cancer Neg Hx    Esophageal cancer Neg Hx    Stomach cancer Neg Hx     Social History   Socioeconomic History   Marital status: Single    Spouse name: Not on file   Number of children: 0   Years of education: 16   Highest education level: Not on file  Occupational History   Occupation: customer service    Employer: [employer]  Tobacco Use   Smoking status: Never   Smokeless tobacco: Never  Vaping Use   Vaping Use: Never used  Substance and Sexual Activity   Alcohol use: No   Drug use: No   Sexual activity: Not on file  Other Topics Concern   Not on file  Social History Narrative   Lives at home with mother.   Caffeine use: Diet and caffeine free soda (8 drinks per day)         Epworth Sleepiness Scale = 7 (as of 01/26/2016)   Social Determinants of Health   Financial Resource Strain: Not on file  Food Insecurity: Not on file  Transportation Needs: Not on file  Physical Activity: Not on file  Stress: Not on file  Social Connections: Not on file  Intimate Partner Violence: Not on  file      Review of systems: All other review of systems negative except as mentioned in the HPI.   Physical Exam: Vitals:   09/09/21 0816  BP: 110/74  Pulse: 87  SpO2: 97%   Body mass index is 42.22 kg/m. Gen:      No acute distress HEENT:  sclera anicteric Abd:      soft, mild RUQ and LLQ-tender; no palpable masses, no distension Ext:    No edema Neuro: alert and oriented x 3 Psych: normal mood and affect  Data Reviewed:  Reviewed labs, radiology imaging, old records and pertinent past GI work up   Assessment and Plan/Recommendations:    70 year old female with history of  chronic GERD, H. pylori gastritis status post eradication 2017 here with complaints of abdominal pain  Schedule for CT abdomen and pelvis to exclude acute diverticulitis or acute intra-abdominal pathology  GERD: Continue Nexium and antireflux measures  Irritable bowel syndrome constipation and diarrhea: Advised patient to do bowel purge with MiraLAX Continue with high-fiber diet and increase water intake  Use dicyclomine 10 mg every 6 hours as needed for abdominal cramping  The patient was provided an opportunity to ask questions and all were answered. The patient agreed with the plan and demonstrated an understanding of the instructions.  Damaris Hippo , MD    CC: Crist Infante, MD

## 2021-09-13 ENCOUNTER — Ambulatory Visit (INDEPENDENT_AMBULATORY_CARE_PROVIDER_SITE_OTHER)
Admission: RE | Admit: 2021-09-13 | Discharge: 2021-09-13 | Disposition: A | Discharge status: Home / Self Care | Payer: Medicare Other | Source: Ambulatory Visit | Attending: Gastroenterology | Admitting: Gastroenterology

## 2021-09-13 ENCOUNTER — Other Ambulatory Visit: Payer: Self-pay

## 2021-09-13 DIAGNOSIS — R1011 Right upper quadrant pain: Secondary | ICD-10-CM

## 2021-09-13 DIAGNOSIS — R1084 Generalized abdominal pain: Secondary | ICD-10-CM

## 2021-09-13 DIAGNOSIS — K219 Gastro-esophageal reflux disease without esophagitis: Secondary | ICD-10-CM

## 2021-09-13 MED ORDER — IOHEXOL 350 MG/ML SOLN
100.0000 mL | Freq: Once | INTRAVENOUS | Status: AC | PRN
  Administered 2021-09-13: 100 mL via INTRAVENOUS

## 2021-11-15 ENCOUNTER — Encounter: Payer: Self-pay | Admitting: Gastroenterology

## 2021-11-15 ENCOUNTER — Ambulatory Visit: Payer: Medicare Other | Admitting: Gastroenterology

## 2021-11-15 VITALS — BP 172/90 | HR 70 | Ht <= 58 in | Wt 202.0 lb

## 2021-11-15 DIAGNOSIS — R109 Unspecified abdominal pain: Secondary | ICD-10-CM

## 2021-11-15 DIAGNOSIS — K760 Fatty (change of) liver, not elsewhere classified: Secondary | ICD-10-CM | POA: Diagnosis not present

## 2021-11-15 DIAGNOSIS — R110 Nausea: Secondary | ICD-10-CM | POA: Diagnosis not present

## 2021-11-15 DIAGNOSIS — E65 Localized adiposity: Secondary | ICD-10-CM | POA: Diagnosis not present

## 2021-11-15 DIAGNOSIS — K219 Gastro-esophageal reflux disease without esophagitis: Secondary | ICD-10-CM

## 2021-11-15 NOTE — Progress Notes (Signed)
Shawna Gonzalez    [id number]    [birth year]  Primary Care Physician:Perini, Elta Guadeloupe, MD  Referring Physician: Crist Infante, [place],  [place] [address]   Chief complaint:  Nausea, GERD, Abdominal pain  HPI:  70 year old female with history of H. pylori gastritis status post Pylera therapy with eradication in 2017, chronic GERD with complaints of nausea, abdominal pain, bloating and excessive belching    She is no longer having severe abdominal pain or discomfort, overall feels slightly better. She continues to have intermittent nausea and heartburn.  She is taking Nexium twice daily  She recently had a fall and hurt her shoulder.  She is trying to exercise and is struggling with dietary changes but has plans to alter her diet to lose weight  CT abd & pelvis 09/13/21 Colonic diverticulosis. No radiographic evidence of diverticulitis or other acute findings. Stable hepatic steatosis   Colonoscopy 05/24/2020: - Three 5 to 9 mm polyps in the descending colon, in the transverse colon and in the ascending colon, removed with a cold snare. Resected and retrieved. - One less than 1 mm polyp in the rectum, removed with a cold biopsy forceps. Resected and retrieved. - Moderate diverticulosis in the sigmoid colon, in the descending colon, in the transverse colon, in the ascending colon and in the cecum. Purulent discharge was seen in association with the diverticular opening, suspicious of diverticulitis. Biopsied.   Colonoscopy January 23, 2012 by Dr. Olevia Perches diverticulosis with removal of 2 hyperplastic polyps   Outpatient Encounter Medications as of 11/15/2021  Medication Sig   albuterol (PROVENTIL HFA;VENTOLIN HFA) 108 (90 BASE) MCG/ACT inhaler Inhale 2 puffs into the lungs every 6 (six) hours as needed for wheezing or shortness of breath.   budesonide-formoterol (SYMBICORT) 160-4.5 MCG/ACT inhaler Inhale 2 puffs into the lungs daily.   dicyclomine  (BENTYL) 10 MG capsule Take 1 capsule (10 mg total) by mouth 4 (four) times daily -  before meals and at bedtime.   dorzolamide (TRUSOPT) 2 % ophthalmic solution INSTILL 1 DROP INTO BOTH EYES THREE TIMES DAILY   esomeprazole (NEXIUM) 40 MG capsule Take 40 mg by mouth 2 (two) times daily.   furosemide (LASIX) 40 MG tablet Take 40 mg by mouth every evening.    levocetirizine (XYZAL) 5 MG tablet Take 5 mg by mouth every evening.   losartan (COZAAR) 25 MG tablet Take 25 mg by mouth daily.   potassium chloride SA (K-DUR,KLOR-CON) 20 MEQ tablet Take 20 mEq by mouth every Tuesday, Thursday, and Saturday at 6 PM.    traMADol (ULTRAM) 50 MG tablet Take 1 tablet (50 mg total) by mouth every 6 (six) hours as needed for moderate pain.   Travoprost, BAK Free, (TRAVATAN) 0.004 % SOLN ophthalmic solution PLACE 1 DROP INTO BOTH EYES NIGHTLY   Vitamin D, Ergocalciferol, (DRISDOL) 50000 UNITS CAPS Take 50,000 Units by mouth 2 (two) times a week. Sunday & Thursday   No facility-administered encounter medications on file as of 11/15/2021.    Allergies as of 11/15/2021 - Review Complete 11/15/2021  Allergen Reaction Noted   Amlodipine Cough 08/03/2015   Bydureon [exenatide] Itching and Other (See Comments) 08/03/2015   Lisinopril Cough 08/03/2015   Lunesta [eszopiclone] Other (See Comments) 08/03/2015   Sulfa antibiotics Rash 01/09/2012    Past Medical History:  Diagnosis Date   Adenomatous colon polyp    Anemia    history of   Arthritis    Asthma  Atypical chest pain    Bacterial ear infection 2018   Cancer ([place])    melanoma skin cancer 7 years ago   Cataract    Diabetes mellitus without complication ([place])    history of taking diabetic medication, has lost weight no longer needing medciation at this time   GERD (gastroesophageal reflux disease)    Glaucoma    Heart murmur    slight   Hypertension    Macular degeneration    Osteoporosis    Sleep apnea    cpap   Ulcerative colitis, chronic  ([place])     Past Surgical History:  Procedure Laterality Date   CARPAL TUNNEL RELEASE Right 12/16/2009   CATARACT EXTRACTION W/ INTRAOCULAR LENS  IMPLANT, BILATERAL Bilateral 05/21/2007(Right); 06/04/2007(Left)   CHOLECYSTECTOMY  2000   COLONOSCOPY WITH PROPOFOL  01/23/2012   DE QUERVAIN'S RELEASE Right 11/30/2005   HYSTERECTOMY ABDOMINAL WITH SALPINGECTOMY Bilateral 01/09/2017   Procedure: HYSTERECTOMY ABDOMINAL WITH SALPINGOOPHORECTOMY;  Surgeon: Dian Queen, MD;  Location: [facility];  Service: Gynecology;  Laterality: Bilateral;   TONSILLECTOMY     UPPER GI ENDOSCOPY  03/05/2017   MAC    Family History  Problem Relation Age of Onset   Colon polyps Mother    Heart disease Mother    Colon polyps Father    Colon polyps Brother    Colon cancer Neg Hx    Esophageal cancer Neg Hx    Stomach cancer Neg Hx     Social History   Socioeconomic History   Marital status: Single    Spouse name: Not on file   Number of children: 0   Years of education: 16   Highest education level: Not on file  Occupational History   Occupation: customer service    Employer: [employer]  Tobacco Use   Smoking status: Never   Smokeless tobacco: Never  Vaping Use   Vaping Use: Never used  Substance and Sexual Activity   Alcohol use: No   Drug use: No   Sexual activity: Not on file  Other Topics Concern   Not on file  Social History Narrative   Lives at home with mother.   Caffeine use: Diet and caffeine free soda (8 drinks per day)         Epworth Sleepiness Scale = 7 (as of 01/26/2016)   Social Determinants of Health   Financial Resource Strain: Not on file  Food Insecurity: Not on file  Transportation Needs: Not on file  Physical Activity: Not on file  Stress: Not on file  Social Connections: Not on file  Intimate Partner Violence: Not on file      Review of systems: All other review of systems negative except as mentioned in the HPI.   Physical Exam: Vitals:   11/15/21 0915   BP: (!) 172/90  Pulse: 70   Body mass index is 42.22 kg/m. Gen:      No acute distress HEENT:  sclera anicteric Neuro: alert and oriented x 3 Psych: normal mood and affect  Data Reviewed:  Reviewed labs, radiology imaging, old records and pertinent past GI work up   Assessment and Plan/Recommendations:  70 year old female with history of chronic GERD, H. pylori gastritis status post eradication 2017 here with complaints of nausea and breakthrough heartburn   GERD: Continue Nexium and antireflux measures Use Gaviscon up to 3 times daily after meals as needed for breakthrough symptoms  Abdominal discomfort: Improved CT abdomen pelvis negative for any acute pathology other than fatty liver  Fatty liver: Metabolic secondary to central obesity  Discussed diet and exercise to lose weight Avoid high carb and high fat diet   Return in 4 months  This visit required 31 minutes of patient care (this includes precharting, chart review, review of results, face-to-face time used for counseling as well as treatment plan and follow-up. The patient was provided an opportunity to ask questions and all were answered. The patient agreed with the plan and demonstrated an understanding of the instructions.  Damaris Hippo , MD    CC: Crist Infante, MD

## 2021-11-15 NOTE — Patient Instructions (Signed)
Continue Nexium Twice daily  Use Gaviscon three times a day as needed after meals  Do a Low carb and Low fat diet  Continue daily exercises  Follow up in 4 months  If you are age 69 or older, your body mass index should be between 23-30. Your Body mass index is 42.22 kg/m. If this is out of the aforementioned range listed, please consider follow up with your Primary Care Provider.  If you are age 60 or younger, your body mass index should be between 19-25. Your Body mass index is 42.22 kg/m. If this is out of the aformentioned range listed, please consider follow up with your Primary Care Provider.   ________________________________________________________  The [facility] providers would like to encourage you to use [facility] to communicate with providers for non-urgent requests or questions.  Due to long hold times on the telephone, sending your provider a message by [facility] may be a faster and more efficient way to get a response.  Please allow 48 business hours for a response.  Please remember that this is for non-urgent requests.  _______________________________________________________    I appreciate the  opportunity to care for you  Thank You   Harl Bowie , MD

## 2021-12-21 ENCOUNTER — Telehealth: Payer: Self-pay | Admitting: Gastroenterology

## 2021-12-21 NOTE — Telephone Encounter (Signed)
Please advise 

## 2021-12-21 NOTE — Telephone Encounter (Signed)
Inbound call from patient she has been experiencing nausea for the 2 days and is wanting to know if she can get medication to help with it please.  Can be sent to [facility] in chart.  Please advise.

## 2021-12-22 MED ORDER — ONDANSETRON HCL 4 MG PO TABS: 4.0000 mg | ORAL_TABLET | Freq: Three times a day (TID) | ORAL | 0 refills | Status: DC | PRN

## 2021-12-22 NOTE — Telephone Encounter (Signed)
Please send Rx for Zofran 4mg  daily as needed X 10 tabs with no refills. Please schedule follow up office visit next available with me or APP. Thanks

## 2021-12-22 NOTE — Telephone Encounter (Signed)
Called pt and informed her of Dr Jillyn Hidden recommendations   She has a follow up 01/18/2022

## 2022-01-18 ENCOUNTER — Other Ambulatory Visit (INDEPENDENT_AMBULATORY_CARE_PROVIDER_SITE_OTHER): Payer: Medicare Other

## 2022-01-18 ENCOUNTER — Encounter: Payer: Self-pay | Admitting: Gastroenterology

## 2022-01-18 ENCOUNTER — Ambulatory Visit: Payer: Medicare Other | Admitting: Gastroenterology

## 2022-01-18 VITALS — BP 124/72 | HR 94 | Ht <= 58 in | Wt 194.0 lb

## 2022-01-18 DIAGNOSIS — Z8619 Personal history of other infectious and parasitic diseases: Secondary | ICD-10-CM

## 2022-01-18 DIAGNOSIS — R1013 Epigastric pain: Secondary | ICD-10-CM

## 2022-01-18 DIAGNOSIS — K760 Fatty (change of) liver, not elsewhere classified: Secondary | ICD-10-CM

## 2022-01-18 DIAGNOSIS — R110 Nausea: Secondary | ICD-10-CM

## 2022-01-18 DIAGNOSIS — Z86010 Personal history of colonic polyps: Secondary | ICD-10-CM

## 2022-01-18 LAB — COMPREHENSIVE METABOLIC PANEL
ALT: 15 U/L (ref 0–35)
AST: 15 U/L (ref 0–37)
Albumin: 4.4 g/dL (ref 3.5–5.2)
Alkaline Phosphatase: 114 U/L (ref 39–117)
BUN: 11 mg/dL (ref 6–23)
CO2: 33 mEq/L — ABNORMAL HIGH (ref 19–32)
Calcium: 9.4 mg/dL (ref 8.4–10.5)
Chloride: 99 mEq/L (ref 96–112)
Creatinine, Ser: 0.66 mg/dL (ref 0.40–1.20)
GFR: 89.13 mL/min (ref >60.00)
Glucose, Bld: 99 mg/dL (ref 70–99)
Potassium: 3.5 mEq/L (ref 3.5–5.1)
Sodium: 139 mEq/L (ref 135–145)
Total Bilirubin: 0.8 mg/dL (ref 0.2–1.2)
Total Protein: 8.2 g/dL (ref 6.0–8.3)

## 2022-01-18 LAB — CBC WITH DIFFERENTIAL/PLATELET
Basophils Absolute: 0.1 10*3/uL (ref 0.0–0.1)
Basophils Relative: 0.9 % (ref 0.0–3.0)
Eosinophils Absolute: 0.1 10*3/uL (ref 0.0–0.7)
Eosinophils Relative: 1.1 % (ref 0.0–5.0)
HCT: 44.7 % (ref 36.0–46.0)
Hemoglobin: 14.8 g/dL (ref 12.0–15.0)
Lymphocytes Relative: 23.7 % (ref 12.0–46.0)
Lymphs Abs: 2.5 10*3/uL (ref 0.7–4.0)
MCHC: 33.2 g/dL (ref 30.0–36.0)
MCV: 83.4 fl (ref 78.0–100.0)
Monocytes Absolute: 0.8 10*3/uL (ref 0.1–1.0)
Monocytes Relative: 7.3 % (ref 3.0–12.0)
Neutro Abs: 7 10*3/uL (ref 1.4–7.7)
Neutrophils Relative %: 67 % (ref 43.0–77.0)
Platelets: 249 10*3/uL (ref 150.0–400.0)
RBC: 5.36 Mil/uL — ABNORMAL HIGH (ref 3.87–5.11)
RDW: 14.9 % (ref 11.5–15.5)
WBC: 10.5 10*3/uL (ref 4.0–10.5)

## 2022-01-18 LAB — AMYLASE: Amylase: 19 U/L — ABNORMAL LOW (ref 27–131)

## 2022-01-18 LAB — LIPASE: Lipase: 4 U/L — ABNORMAL LOW (ref 11.0–59.0)

## 2022-01-18 MED ORDER — ONDANSETRON HCL 4 MG PO TABS: 4.0000 mg | ORAL_TABLET | Freq: Three times a day (TID) | ORAL | 0 refills | Status: DC | PRN

## 2022-01-18 MED ORDER — ESOMEPRAZOLE MAGNESIUM 40 MG PO CPDR: 40.0000 mg | DELAYED_RELEASE_CAPSULE | Freq: Two times a day (BID) | ORAL | 6 refills | Status: DC

## 2022-01-18 NOTE — Progress Notes (Signed)
Shawna Gonzalez    [id number]    10/24/1951  Primary Care Physician:Perini, Elta Guadeloupe, MD  Referring Physician: Crist Infante, [place],  [place] [address]   Chief complaint:  GERD, nausea  HPI: 71 year old very pleasant female with history of H. pylori gastritis status post Pylera therapy with eradication in 2017, chronic GERD with complaints of persistent abdominal on and off nausea, abdominal pain, bloating and excessive belching    She had an episode of severe nausea associated with diarrhea few weeks ago last month that lasted for few hours.  She is unable to identify any triggers, thinks it may be associated with her diet but she is unable to identify which foods bother her most. She is experiencing significant upper abdominal discomfort, worse in the epigastric area   CT abd & pelvis 09/13/21 Colonic diverticulosis. No radiographic evidence of diverticulitis or other acute findings. Stable hepatic steatosis   Colonoscopy 05/24/2020: - Three 5 to 9 mm polyps in the descending colon, in the transverse colon and in the ascending colon, removed with a cold snare. Resected and retrieved. - One less than 1 mm polyp in the rectum, removed with a cold biopsy forceps. Resected and retrieved. - Moderate diverticulosis in the sigmoid colon, in the descending colon, in the transverse colon, in the ascending colon and in the cecum. Purulent discharge was seen in association with the diverticular opening, suspicious of diverticulitis. Biopsied.   Colonoscopy January 23, 2012 by Dr. Olevia Perches diverticulosis with removal of 2 hyperplastic polyps     Outpatient Encounter Medications as of 01/18/2022  Medication Sig   albuterol (PROVENTIL HFA;VENTOLIN HFA) 108 (90 BASE) MCG/ACT inhaler Inhale 2 puffs into the lungs every 6 (six) hours as needed for wheezing or shortness of breath.   budesonide-formoterol (SYMBICORT) 160-4.5 MCG/ACT inhaler Inhale 2 puffs into the  lungs daily.   dicyclomine (BENTYL) 10 MG capsule Take 1 capsule (10 mg total) by mouth 4 (four) times daily -  before meals and at bedtime.   dorzolamide (TRUSOPT) 2 % ophthalmic solution INSTILL 1 DROP INTO BOTH EYES THREE TIMES DAILY   esomeprazole (NEXIUM) 40 MG capsule Take 40 mg by mouth 2 (two) times daily.   furosemide (LASIX) 40 MG tablet Take 40 mg by mouth every evening.    levocetirizine (XYZAL) 5 MG tablet Take 5 mg by mouth every evening.   losartan (COZAAR) 25 MG tablet Take 25 mg by mouth daily.   ondansetron (ZOFRAN) 4 MG tablet Take 1 tablet (4 mg total) by mouth every 8 (eight) hours as needed for nausea or vomiting.   potassium chloride SA (K-DUR,KLOR-CON) 20 MEQ tablet Take 20 mEq by mouth every Tuesday, Thursday, and Saturday at 6 PM.    traMADol (ULTRAM) 50 MG tablet Take 1 tablet (50 mg total) by mouth every 6 (six) hours as needed for moderate pain.   Travoprost, BAK Free, (TRAVATAN) 0.004 % SOLN ophthalmic solution PLACE 1 DROP INTO BOTH EYES NIGHTLY   Vitamin D, Ergocalciferol, (DRISDOL) 50000 UNITS CAPS Take 50,000 Units by mouth 2 (two) times a week. Sunday & Thursday   No facility-administered encounter medications on file as of 01/18/2022.    Allergies as of 01/18/2022 - Review Complete 01/18/2022  Allergen Reaction Noted   Amlodipine Cough 08/03/2015   Bydureon [exenatide] Itching and Other (See Comments) 08/03/2015   Lisinopril Cough 08/03/2015   Lunesta [eszopiclone] Other (See Comments) 08/03/2015   Sulfa antibiotics Rash 01/09/2012  Past Medical History:  Diagnosis Date   Adenomatous colon polyp    Anemia    history of   Arthritis    Asthma    Atypical chest pain    Bacterial ear infection 2018   Cancer ([place])    melanoma skin cancer 7 years ago   Cataract    Diabetes mellitus without complication ([place])    history of taking diabetic medication, has lost weight no longer needing medciation at this time   GERD (gastroesophageal reflux disease)     Glaucoma    Heart murmur    slight   Hypertension    Macular degeneration    Osteoporosis    Sleep apnea    cpap   Ulcerative colitis, chronic ([place])     Past Surgical History:  Procedure Laterality Date   CARPAL TUNNEL RELEASE Right 12/16/2009   CATARACT EXTRACTION W/ INTRAOCULAR LENS  IMPLANT, BILATERAL Bilateral 05/21/2007(Right); 06/04/2007(Left)   CHOLECYSTECTOMY  2000   COLONOSCOPY WITH PROPOFOL  01/23/2012   DE QUERVAIN'S RELEASE Right 11/30/2005   HYSTERECTOMY ABDOMINAL WITH SALPINGECTOMY Bilateral 01/09/2017   Procedure: HYSTERECTOMY ABDOMINAL WITH SALPINGOOPHORECTOMY;  Surgeon: Dian Queen, MD;  Location: [facility];  Service: Gynecology;  Laterality: Bilateral;   TONSILLECTOMY     UPPER GI ENDOSCOPY  03/05/2017   MAC    Family History  Problem Relation Age of Onset   Colon polyps Mother    Heart disease Mother    Colon polyps Father    Colon polyps Brother    Colon cancer Neg Hx    Esophageal cancer Neg Hx    Stomach cancer Neg Hx     Social History   Socioeconomic History   Marital status: Single    Spouse name: Not on file   Number of children: 0   Years of education: 16   Highest education level: Not on file  Occupational History   Occupation: customer service    Employer: [employer]  Tobacco Use   Smoking status: Never   Smokeless tobacco: Never  Vaping Use   Vaping Use: Never used  Substance and Sexual Activity   Alcohol use: No   Drug use: No   Sexual activity: Not on file  Other Topics Concern   Not on file  Social History Narrative   Lives at home with mother.   Caffeine use: Diet and caffeine free soda (8 drinks per day)         Epworth Sleepiness Scale = 7 (as of 01/26/2016)   Social Determinants of Health   Financial Resource Strain: Not on file  Food Insecurity: Not on file  Transportation Needs: Not on file  Physical Activity: Not on file  Stress: Not on file  Social Connections: Not on file  Intimate Partner Violence: Not on  file      Review of systems: All other review of systems negative except as mentioned in the HPI.   Physical Exam: Vitals:   01/18/22 1334  BP: 124/72  Pulse: 94   Body mass index is 40.55 kg/m. Gen:      No acute distress HEENT:  sclera anicteric Abd:      soft, mild generalized upper abdominal tenderness; no palpable masses, + distension Ext:    No edema Neuro: alert and oriented x 3 Psych: normal mood and affect  Data Reviewed:  Reviewed labs, radiology imaging, old records and pertinent past GI work up   Assessment and Plan/Recommendations:  71 year old very pleasant female with history of chronic GERD,  H. pylori gastritis status post eradication 2017 here with complaints of nausea and epigastric abdominal pain  We will schedule for EGD for further evaluation, exclude severe erosive esophagitis or peptic ulcer disease The risks and benefits as well as alternatives of endoscopic procedure(s) have been discussed and reviewed. All questions answered. The patient agrees to proceed.   Abdominal bloating: We will check TTG IgA antibody to exclude celiac disease  She is status postcholecystectomy and has history of pancreatitis Check amylase and lipase    Fatty liver: Follow-up CMP, metabolic secondary to central obesity  Avoid high carb and high fat diet Daily exercise  History of colon polyps: Due for surveillance colonoscopy June 2024   Return in 2 months   The patient was provided an opportunity to ask questions and all were answered. The patient agreed with the plan and demonstrated an understanding of the instructions.  Damaris Hippo , MD    CC: Crist Infante, MD

## 2022-01-18 NOTE — Patient Instructions (Signed)
Your provider has requested that you go to the basement level for lab work before leaving today. Press "B" on the elevator. The lab is located at the first door on the left as you exit the elevator.   You have been scheduled for an endoscopy. Please follow written instructions given to you at your visit today. If you use inhalers (even only as needed), please bring them with you on the day of your procedure.   We have sent Zofran and Nexium refills to your pharmacy  Due to recent changes in healthcare laws, you may see the results of your imaging and laboratory studies on MyChart before your provider has had a chance to review them.  We understand that in some cases there may be results that are confusing or concerning to you. Not all laboratory results come back in the same time frame and the provider may be waiting for multiple results in order to interpret others.  Please give [place] 48 hours in order for your provider to thoroughly review all the results before contacting the office for clarification of your results.    I appreciate the  opportunity to care for you  Thank You   Harl Bowie , MD

## 2022-01-19 LAB — TISSUE TRANSGLUTAMINASE, IGA: (tTG) Ab, IgA: <1 U/mL

## 2022-01-19 LAB — IGA: Immunoglobulin A: 302 mg/dL (ref 70–320)

## 2022-02-01 ENCOUNTER — Ambulatory Visit: Payer: Medicare Other | Admitting: Adult Health

## 2022-02-01 ENCOUNTER — Encounter: Payer: Self-pay | Admitting: Adult Health

## 2022-02-01 ENCOUNTER — Other Ambulatory Visit: Payer: Self-pay

## 2022-02-01 VITALS — BP 152/80 | HR 79 | Ht <= 58 in | Wt 201.8 lb

## 2022-02-01 DIAGNOSIS — Z9989 Dependence on other enabling machines and devices: Secondary | ICD-10-CM | POA: Diagnosis not present

## 2022-02-01 DIAGNOSIS — G4733 Obstructive sleep apnea (adult) (pediatric): Secondary | ICD-10-CM | POA: Diagnosis not present

## 2022-02-01 NOTE — Progress Notes (Signed)
PATIENT: Shawna Gonzalez DOB: Apr 24, 1951  REASON FOR VISIT: follow up HISTORY FROM: patient PRIMARY NEUROLOGIST: Dr. Brett Fairy  HISTORY OF PRESENT ILLNESS: Today 02/01/22   Shawna Gonzalez is a 71 year old female with a history of OSA on CPAP. She returns today for follow-up. CPAP is working well. Mask does make a mark on her face but doesn't want to change mask at this time.   REVIEW OF SYSTEMS: Out of a complete 14 system review of symptoms, the patient complains only of the following symptoms, and all other reviewed systems are negative.  ESS 0  ALLERGIES: Allergies  Allergen Reactions   Amlodipine Cough   Bydureon [Exenatide] Itching and Other (See Comments)    KNOTS IN SKIN   Lisinopril Cough   Lunesta [Eszopiclone] Other (See Comments)    Metallic taste in mouth   Sulfa Antibiotics Rash    HOME MEDICATIONS: Outpatient Medications Prior to Visit  Medication Sig Dispense Refill   albuterol (PROVENTIL HFA;VENTOLIN HFA) 108 (90 BASE) MCG/ACT inhaler Inhale 2 puffs into the lungs every 6 (six) hours as needed for wheezing or shortness of breath.     budesonide-formoterol (SYMBICORT) 160-4.5 MCG/ACT inhaler Inhale 2 puffs into the lungs daily.     dicyclomine (BENTYL) 10 MG capsule Take 1 capsule (10 mg total) by mouth 4 (four) times daily -  before meals and at bedtime. 90 capsule 2   dorzolamide (TRUSOPT) 2 % ophthalmic solution INSTILL 1 DROP INTO BOTH EYES THREE TIMES DAILY 30 mL 0   esomeprazole (NEXIUM) 40 MG capsule Take 1 capsule (40 mg total) by mouth 2 (two) times daily. 60 capsule 6   furosemide (LASIX) 40 MG tablet Take 40 mg by mouth every evening.   4   levocetirizine (XYZAL) 5 MG tablet Take 5 mg by mouth every evening.     losartan (COZAAR) 25 MG tablet Take 25 mg by mouth daily.     ondansetron (ZOFRAN) 4 MG tablet Take 1 tablet (4 mg total) by mouth every 8 (eight) hours as needed for nausea or vomiting. 10 tablet 0   potassium chloride SA  (K-DUR,KLOR-CON) 20 MEQ tablet Take 20 mEq by mouth every Tuesday, Thursday, and Saturday at 6 PM.      traMADol (ULTRAM) 50 MG tablet Take 1 tablet (50 mg total) by mouth every 6 (six) hours as needed for moderate pain. 30 tablet 0   Travoprost, BAK Free, (TRAVATAN) 0.004 % SOLN ophthalmic solution PLACE 1 DROP INTO BOTH EYES NIGHTLY     Vitamin D, Ergocalciferol, (DRISDOL) 50000 UNITS CAPS Take 50,000 Units by mouth 2 (two) times a week. Sunday & Thursday     No facility-administered medications prior to visit.    PAST MEDICAL HISTORY: Past Medical History:  Diagnosis Date   Adenomatous colon polyp    Anemia    history of   Arthritis    Asthma    Atypical chest pain    Bacterial ear infection 2018   Cancer ([place])    melanoma skin cancer 7 years ago   Cataract    Diabetes mellitus without complication ([place])    history of taking diabetic medication, has lost weight no longer needing medciation at this time   GERD (gastroesophageal reflux disease)    Glaucoma    Heart murmur    slight   Hypertension    Macular degeneration    Osteoporosis    Sleep apnea    cpap   Ulcerative colitis, chronic ([place])  PAST SURGICAL HISTORY: Past Surgical History:  Procedure Laterality Date   CARPAL TUNNEL RELEASE Right 12/16/2009   CATARACT EXTRACTION W/ INTRAOCULAR LENS  IMPLANT, BILATERAL Bilateral 05/21/2007(Right); 06/04/2007(Left)   CHOLECYSTECTOMY  2000   COLONOSCOPY WITH PROPOFOL  01/23/2012   DE QUERVAIN'S RELEASE Right 11/30/2005   HYSTERECTOMY ABDOMINAL WITH SALPINGECTOMY Bilateral 01/09/2017   Procedure: HYSTERECTOMY ABDOMINAL WITH SALPINGOOPHORECTOMY;  Surgeon: Dian Queen, MD;  Location: [facility];  Service: Gynecology;  Laterality: Bilateral;   TONSILLECTOMY     UPPER GI ENDOSCOPY  03/05/2017   MAC    FAMILY HISTORY: Family History  Problem Relation Age of Onset   Colon polyps Mother    Heart disease Mother    COPD Mother        early signs of   Colon polyps Father     Heart disease Father    Colon polyps Brother    Colon cancer Neg Hx    Esophageal cancer Neg Hx    Stomach cancer Neg Hx    Sleep apnea Neg Hx     SOCIAL HISTORY: Social History   Socioeconomic History   Marital status: Single    Spouse name: Not on file   Number of children: 0   Years of education: 16   Highest education level: Not on file  Occupational History   Occupation: customer service    Employer: [employer]  Tobacco Use   Smoking status: Never   Smokeless tobacco: Never  Vaping Use   Vaping Use: Never used  Substance and Sexual Activity   Alcohol use: No   Drug use: No   Sexual activity: Not on file  Other Topics Concern   Not on file  Social History Narrative   Lives at home with mother.   Caffeine use: Diet and caffeine free soda (8 drinks per day)         Epworth Sleepiness Scale = 7 (as of 01/26/2016)   Social Determinants of Health   Financial Resource Strain: Not on file  Food Insecurity: Not on file  Transportation Needs: Not on file  Physical Activity: Not on file  Stress: Not on file  Social Connections: Not on file  Intimate Partner Violence: Not on file      PHYSICAL EXAM  Vitals:   02/01/22 1052  BP: (!) 152/80  Pulse: 79  Weight: 201 lb 12.8 oz (91.5 kg)  Height: _0  (1.473 m)   Body mass index is 42.18 kg/m.  Generalized: Well developed, in no acute distress  Chest: Lungs clear to auscultation bilaterally  Neurological examination  Mentation: Alert oriented to time, place, history taking. Follows all commands speech and language fluent Cranial nerve II-XII: Extraocular movements were full, visual field were full on confrontational test Head turning and shoulder shrug  were normal and symmetric. Motor: The motor testing reveals 5 over 5 strength of all 4 extremities. Good symmetric motor tone is noted throughout.  Sensory: Sensory testing is intact to soft touch on all 4 extremities. No evidence of extinction is noted.  Gait  and station: Gait is normal.    DIAGNOSTIC DATA (LABS, IMAGING, TESTING) - I reviewed patient records, labs, notes, testing and imaging myself where available.  Lab Results  Component Value Date   WBC 10.5 01/18/2022   HGB 14.8 01/18/2022   HCT 44.7 01/18/2022   MCV 83.4 01/18/2022   PLT 249.0 01/18/2022      Component Value Date/Time   NA 139 01/18/2022 1419   NA 142 03/15/2021 1233  K 3.5 01/18/2022 1419   CL 99 01/18/2022 1419   CO2 33 (H) 01/18/2022 1419   GLUCOSE 99 01/18/2022 1419   BUN 11 01/18/2022 1419   BUN 14 03/15/2021 1233   CREATININE 0.66 01/18/2022 1419   CALCIUM 9.4 01/18/2022 1419   PROT 8.2 01/18/2022 1419   ALBUMIN 4.4 01/18/2022 1419   AST 15 01/18/2022 1419   ALT 15 01/18/2022 1419   ALKPHOS 114 01/18/2022 1419   BILITOT 0.8 01/18/2022 1419   GFRNONAA >60 01/09/2017 1137   GFRAA >60 01/09/2017 1137    Lab Results  Component Value Date   VITAMINB12 376 01/16/2007       ASSESSMENT AND PLAN 71 y.o. year old female  has a past medical history of Adenomatous colon polyp, Anemia, Arthritis, Asthma, Atypical chest pain, Bacterial ear infection (2018), Cancer ([place]), Cataract, Diabetes mellitus without complication ([place]), GERD (gastroesophageal reflux disease), Glaucoma, Heart murmur, Hypertension, Macular degeneration, Osteoporosis, Sleep apnea, and Ulcerative colitis, chronic ([place]). here with:  OSA on CPAP  - CPAP compliance excellent - Good treatment of AHI  - Encourage patient to use CPAP nightly and > 4 hours each night - F/U in 1 year or sooner if needed    Ward Givens, MSN, NP-C 02/01/2022, 11:02 AM [facility] [address] [place], [place] [address] [phone]

## 2022-02-01 NOTE — Patient Instructions (Signed)
Continue using CPAP nightly and greater than 4 hours each night °If your symptoms worsen or you develop new symptoms please let us know.  ° °

## 2022-02-08 ENCOUNTER — Ambulatory Visit (AMBULATORY_SURGERY_CENTER): Payer: Medicare Other | Admitting: Gastroenterology

## 2022-02-08 ENCOUNTER — Encounter: Payer: Self-pay | Admitting: Gastroenterology

## 2022-02-08 VITALS — BP 119/74 | HR 68 | Temp 97.3°F | Resp 15 | Ht <= 58 in | Wt 202.0 lb

## 2022-02-08 DIAGNOSIS — K259 Gastric ulcer, unspecified as acute or chronic, without hemorrhage or perforation: Secondary | ICD-10-CM

## 2022-02-08 DIAGNOSIS — K2970 Gastritis, unspecified, without bleeding: Secondary | ICD-10-CM

## 2022-02-08 DIAGNOSIS — K2950 Unspecified chronic gastritis without bleeding: Secondary | ICD-10-CM

## 2022-02-08 DIAGNOSIS — K31A0 Gastric intestinal metaplasia, unspecified: Secondary | ICD-10-CM

## 2022-02-08 DIAGNOSIS — K219 Gastro-esophageal reflux disease without esophagitis: Secondary | ICD-10-CM

## 2022-02-08 MED ORDER — SODIUM CHLORIDE 0.9 % IV SOLN: 500.0000 mL | Freq: Once | INTRAVENOUS | Status: DC

## 2022-02-08 NOTE — Progress Notes (Signed)
To Pacu, VSS. Report to [email or web address] 

## 2022-02-08 NOTE — Patient Instructions (Signed)
Please read handouts provided. Continue present medications. Await pathology results. Return to GI office at the next available appointment in 2 months.    YOU HAD AN ENDOSCOPIC PROCEDURE TODAY AT [facility]:   Refer to the procedure report that was given to you for any specific questions about what was found during the examination.  If the procedure report does not answer your questions, please call your gastroenterologist to clarify.  If you requested that your care partner not be given the details of your procedure findings, then the procedure report has been included in a sealed envelope for you to review at your convenience later.  YOU SHOULD EXPECT: Some feelings of bloating in the abdomen. Passage of more gas than usual.  Walking can help get rid of the air that was put into your GI tract during the procedure and reduce the bloating. If you had a lower endoscopy (such as a colonoscopy or flexible sigmoidoscopy) you may notice spotting of blood in your stool or on the toilet paper. If you underwent a bowel prep for your procedure, you may not have a normal bowel movement for a few days.  Please Note:  You might notice some irritation and congestion in your nose or some drainage.  This is from the oxygen used during your procedure.  There is no need for concern and it should clear up in a day or so.  SYMPTOMS TO REPORT IMMEDIATELY:   Following upper endoscopy (EGD)  Vomiting of blood or coffee ground material  New chest pain or pain under the shoulder blades  Painful or persistently difficult swallowing  New shortness of breath  Fever of 100F or higher  Black, tarry-looking stools  For urgent or emergent issues, a gastroenterologist can be reached at any hour by calling [phone]. Do not use MyChart messaging for urgent concerns.    DIET:  We do recommend a small meal at first, but then you may proceed to your regular diet.  Drink plenty of fluids but you  should avoid alcoholic beverages for 24 hours.  ACTIVITY:  You should plan to take it easy for the rest of today and you should NOT DRIVE or use heavy machinery until tomorrow (because of the sedation medicines used during the test).    FOLLOW UP: Our staff will call the number listed on your records 48-72 hours following your procedure to check on you and address any questions or concerns that you may have regarding the information given to you following your procedure. If we do not reach you, we will leave a message.  We will attempt to reach you two times.  During this call, we will ask if you have developed any symptoms of COVID 19. If you develop any symptoms (ie: fever, flu-like symptoms, shortness of breath, cough etc.) before then, please call [phone].  If you test positive for Covid 19 in the 2 weeks post procedure, please call and report this information to [place].    If any biopsies were taken you will be contacted by phone or by letter within the next 1-3 weeks.  Please call us at [phone] if you have not heard about the biopsies in 3 weeks.    SIGNATURES/CONFIDENTIALITY: You and/or your care partner have signed paperwork which will be entered into your electronic medical record.  These signatures attest to the fact that that the information above on your After Visit Summary has been reviewed and is understood.  Full responsibility of the  confidentiality of this discharge information lies with you and/or your care-partner.

## 2022-02-08 NOTE — Progress Notes (Signed)
VS  DT ? ?Pt's states no medical or surgical changes since previsit or office visit. ? ?

## 2022-02-08 NOTE — Progress Notes (Signed)
Called to room to assist during endoscopic procedure.  Patient ID and intended procedure confirmed with present staff. Received instructions for my participation in the procedure from the performing physician.  

## 2022-02-08 NOTE — Progress Notes (Signed)
Please refer to office visit note 01/18/22. No additional changes in H&P Patient is appropriate for planned procedure(s) and anesthesia in an ambulatory setting  K. Denzil Magnuson , MD [phone]

## 2022-02-08 NOTE — Op Note (Signed)
[facility] Patient Name: Shawna Gonzalez Procedure Date: 02/08/2022 11:10 AM MRN: [id number] Endoscopist: Mauri Pole , MD Age: 71 Referring MD:  Date of Birth: 03-28-1951 Gender: Female Account #: [id number] Procedure:                Upper GI endoscopy Indications:              Epigastric abdominal pain, Heartburn,                            Gastro-esophageal reflux disease, Nausea Medicines:                Monitored Anesthesia Care Procedure:                Pre-Anesthesia Assessment:                           - Prior to the procedure, a History and Physical                            was performed, and patient medications and                            allergies were reviewed. The patient's tolerance of                            previous anesthesia was also reviewed. The risks                            and benefits of the procedure and the sedation                            options and risks were discussed with the patient.                            All questions were answered, and informed consent                            was obtained. Prior Anticoagulants: The patient has                            taken no previous anticoagulant or antiplatelet                            agents. ASA Grade Assessment: III - A patient with                            severe systemic disease. After reviewing the risks                            and benefits, the patient was deemed in                            satisfactory condition to undergo the procedure.  After obtaining informed consent, the endoscope was                            passed under direct vision. Throughout the                            procedure, the patient's blood pressure, pulse, and                            oxygen saturations were monitored continuously. The                            Endoscope was introduced through the mouth, and                            advanced to the  second part of duodenum. The upper                            GI endoscopy was accomplished without difficulty.                            The patient tolerated the procedure well. Scope In: Scope Out: Findings:                 The Z-line was regular and was found 36 cm from the                            incisors.                           The gastroesophageal flap valve was visualized                            endoscopically and classified as Hill Grade II                            (fold present, opens with respiration).                           The esophagus was normal.                           A few localized erosions with no bleeding and no                            stigmata of recent bleeding were found in the                            prepyloric region of the stomach. Biopsies were                            taken with a cold forceps for histology. Biopsies                            were taken  with a cold forceps for Helicobacter                            pylori testing.                           Patchy inflammation characterized by congestion                            (edema), erosions and erythema was found in the                            entire examined stomach. Biopsies were taken with a                            cold forceps for Helicobacter pylori testing.                           The examined duodenum was normal. Complications:            No immediate complications. Estimated Blood Loss:     Estimated blood loss was minimal. Impression:               - Z-line regular, 36 cm from the incisors.                           - Gastroesophageal flap valve classified as Hill                            Grade II (fold present, opens with respiration).                           - Normal esophagus.                           - Gastric erosions with no bleeding and no stigmata                            of recent bleeding. Biopsied.                           - Gastritis.  Biopsied.                           - Normal examined duodenum. Recommendation:           - Patient has a contact number available for                            emergencies. The signs and symptoms of potential                            delayed complications were discussed with the                            patient. Return to normal activities tomorrow.  Written discharge instructions were provided to the                            patient.                           - Resume previous diet.                           - Continue present medications.                           - Await pathology results.                           - Return to GI office at the next available                            appointment in 2 months. Mauri Pole, MD 02/08/2022 11:21:43 AM This report has been signed electronically.

## 2022-02-10 ENCOUNTER — Telehealth: Payer: Self-pay | Admitting: *Deleted

## 2022-02-10 NOTE — Telephone Encounter (Signed)
No answer on second attempt follow up call.

## 2022-02-10 NOTE — Telephone Encounter (Signed)
Attempted f/u phone call. No answer. Left message. °

## 2022-02-16 ENCOUNTER — Telehealth: Payer: Self-pay | Admitting: Gastroenterology

## 2022-02-16 NOTE — Telephone Encounter (Signed)
Patient called requesting path results. ?

## 2022-02-17 NOTE — Telephone Encounter (Signed)
Advised the biopsy does not show any cancerous or precancerous cells. She will receive a letter from the doctor about the results and recommendations. ?

## 2022-02-21 ENCOUNTER — Encounter: Payer: Self-pay | Admitting: Gastroenterology

## 2022-03-16 ENCOUNTER — Encounter: Payer: Self-pay | Admitting: Gastroenterology

## 2022-03-16 ENCOUNTER — Ambulatory Visit: Payer: Medicare Other | Admitting: Gastroenterology

## 2022-03-16 VITALS — BP 130/80 | HR 98 | Wt 200.0 lb

## 2022-03-16 DIAGNOSIS — R1013 Epigastric pain: Secondary | ICD-10-CM

## 2022-03-16 DIAGNOSIS — K219 Gastro-esophageal reflux disease without esophagitis: Secondary | ICD-10-CM | POA: Diagnosis not present

## 2022-03-16 MED ORDER — ESOMEPRAZOLE MAGNESIUM 40 MG PO CPDR: 40.0000 mg | DELAYED_RELEASE_CAPSULE | Freq: Two times a day (BID) | ORAL | 6 refills | Status: AC

## 2022-03-16 MED ORDER — DICYCLOMINE HCL 10 MG PO CAPS: 10.0000 mg | ORAL_CAPSULE | Freq: Three times a day (TID) | ORAL | 6 refills | Status: DC

## 2022-03-16 NOTE — Patient Instructions (Addendum)
Follow up in 4 months  ? ?Take FDgard 1 capsule three times a day as needed ? ?We have sent the following medications to your pharmacy for you to pick up at your convenience:   Dicyclomine  Nexium ? ?If you are age 70 or older, your body mass index should be between 23-30. Your Body mass index is 41.8 kg/m?Marland Kitchen If this is out of the aforementioned range listed, please consider follow up with your Primary Care Provider. ? ?If you are age 17 or younger, your body mass index should be between 19-25. Your Body mass index is 41.8 kg/m?Marland Kitchen If this is out of the aformentioned range listed, please consider follow up with your Primary Care Provider.  ? ?________________________________________________________ ? ?The [facility] providers would like to encourage you to use [facility] to communicate with providers for non-urgent requests or questions.  Due to long hold times on the telephone, sending your provider a message by [facility] may be a faster and more efficient way to get a response.  Please allow 48 business hours for a response.  Please remember that this is for non-urgent requests.  ?_______________________________________________________  ?I appreciate the  opportunity to care for you ? ?Thank You  ? ?Harl Bowie , MD  ?

## 2022-03-16 NOTE — Progress Notes (Signed)
? ?       ? ?Shawna Gonzalez    [id number]    02-13-1951 ? ?Primary Care Physician:Perini, Elta Guadeloupe, MD ? ?Referring Physician: Crist Infante, MD ?[address] ?[place],  [place] [address] ? ? ?Chief complaint:  GERD ? ?HPI: ? ?71 year old very pleasant female with history of H. pylori gastritis status post Pylera therapy with eradication in 2017, chronic GERD here for follow-up visit ? ?She continues to have intermittent episodes of nausea and dyspepsia, usually last for 1 to 2 hour and resolved on its own ?She is taking oral potassium daily for hypokalemia ?  ?EGD  ?- Z-line regular, 36 cm from the incisors. ?- Gastroesophageal flap valve classified as Hill Grade II (fold present, opens with respiration). ?- Normal esophagus. ?- Gastric erosions with no bleeding and no stigmata of recent bleeding. Biopsied.  Negative for H. pylori ?- Gastritis. Biopsied. ?- Normal examined duodenum. ?  ?CT abd & pelvis 09/13/21 ?Colonic diverticulosis. No radiographic evidence of diverticulitis ?or other acute findings. ?Stable hepatic steatosis ?  ?Colonoscopy 05/24/2020: ?- Three 5 to 9 mm polyps in the descending colon, in the transverse colon and in the ?ascending colon, removed with a cold snare. Resected and retrieved. ?- One less than 1 mm polyp in the rectum, removed with a cold biopsy forceps. Resected and ?retrieved. ?- Moderate diverticulosis in the sigmoid colon, in the descending colon, in the transverse colon, ?in the ascending colon and in the cecum. Purulent discharge was seen in association with the ?diverticular opening, suspicious of diverticulitis. Biopsied. ?  ?Colonoscopy January 23, 2012 by Dr. Olevia Perches diverticulosis with removal of 2 hyperplastic polyps ? ? ?Outpatient Encounter Medications as of 03/16/2022  ?Medication Sig  ? albuterol (PROVENTIL HFA;VENTOLIN HFA) 108 (90 BASE) MCG/ACT inhaler Inhale 2 puffs into the lungs every 6 (six) hours as needed for wheezing or shortness of breath.  ?  budesonide-formoterol (SYMBICORT) 160-4.5 MCG/ACT inhaler Inhale 2 puffs into the lungs daily.  ? dicyclomine (BENTYL) 10 MG capsule Take 1 capsule (10 mg total) by mouth 4 (four) times daily -  before meals and at bedtime.  ? dorzolamide (TRUSOPT) 2 % ophthalmic solution INSTILL 1 DROP INTO BOTH EYES THREE TIMES DAILY  ? esomeprazole (NEXIUM) 40 MG capsule Take 1 capsule (40 mg total) by mouth 2 (two) times daily.  ? furosemide (LASIX) 40 MG tablet Take 40 mg by mouth every evening.   ? levocetirizine (XYZAL) 5 MG tablet Take 5 mg by mouth every evening.  ? losartan (COZAAR) 25 MG tablet Take 25 mg by mouth daily.  ? ondansetron (ZOFRAN) 4 MG tablet Take 1 tablet (4 mg total) by mouth every 8 (eight) hours as needed for nausea or vomiting.  ? potassium chloride SA (K-DUR,KLOR-CON) 20 MEQ tablet Take 20 mEq by mouth every Tuesday, Thursday, and Saturday at 6 PM.   ? traMADol (ULTRAM) 50 MG tablet Take 1 tablet (50 mg total) by mouth every 6 (six) hours as needed for moderate pain.  ? Travoprost, BAK Free, (TRAVATAN) 0.004 % SOLN ophthalmic solution PLACE 1 DROP INTO BOTH EYES NIGHTLY  ? Vitamin D, Ergocalciferol, (DRISDOL) 50000 UNITS CAPS Take 50,000 Units by mouth 2 (two) times a week. Sunday & Thursday  ? ?Facility-Administered Encounter Medications as of 03/16/2022  ?Medication  ? 0.9 %  sodium chloride infusion  ? ? ?Allergies as of 03/16/2022 - Review Complete 02/08/2022  ?Allergen Reaction Noted  ? Amlodipine Cough 08/03/2015  ? Bydureon [exenatide] Itching and Other (See Comments)  08/03/2015  ? Lisinopril Cough 08/03/2015  ? Lunesta [eszopiclone] Other (See Comments) 08/03/2015  ? Sulfa antibiotics Rash 01/09/2012  ? ? ?Past Medical History:  ?Diagnosis Date  ? Adenomatous colon polyp   ? Anemia   ? history of  ? Arthritis   ? Asthma   ? Atypical chest pain   ? Bacterial ear infection 2018  ? Cancer [facility])   ? melanoma skin cancer 7 years ago  ? Cataract   ? Diabetes mellitus without complication ([place])   ?  history of taking diabetic medication, has lost weight no longer needing medciation at this time  ? GERD (gastroesophageal reflux disease)   ? Glaucoma   ? Heart murmur   ? slight  ? Hypertension   ? Macular degeneration   ? Osteoporosis   ? Sleep apnea   ? cpap  ? Ulcerative colitis, chronic ([place])   ? ? ?Past Surgical History:  ?Procedure Laterality Date  ? CARPAL TUNNEL RELEASE Right 12/16/2009  ? CATARACT EXTRACTION W/ INTRAOCULAR LENS  IMPLANT, BILATERAL Bilateral 05/21/2007(Right); 06/04/2007(Left)  ? CHOLECYSTECTOMY  2000  ? COLONOSCOPY WITH PROPOFOL  01/23/2012  ? DE QUERVAIN'S RELEASE Right 11/30/2005  ? HYSTERECTOMY ABDOMINAL WITH SALPINGECTOMY Bilateral 01/09/2017  ? Procedure: HYSTERECTOMY ABDOMINAL WITH SALPINGOOPHORECTOMY;  Surgeon: Dian Queen, MD;  Location: [facility];  Service: Gynecology;  Laterality: Bilateral;  ? TONSILLECTOMY    ? TRIGGER FINGER RELEASE Right   ? UPPER GI ENDOSCOPY  03/05/2017  ? MAC  ? ? ?Family History  ?Problem Relation Age of Onset  ? Colon polyps Mother   ? Heart disease Mother   ? COPD Mother   ?     early signs of  ? Colon polyps Father   ? Heart disease Father   ? Colon polyps Brother   ? Colon cancer Neg Hx   ? Esophageal cancer Neg Hx   ? Stomach cancer Neg Hx   ? Sleep apnea Neg Hx   ? ? ?Social History  ? ?Socioeconomic History  ? Marital status: Single  ?  Spouse name: Not on file  ? Number of children: 0  ? Years of education: 36  ? Highest education level: Not on file  ?Occupational History  ? Occupation: customer service  ?  Employer: [employer]  ?Tobacco Use  ? Smoking status: Never  ? Smokeless tobacco: Never  ?Vaping Use  ? Vaping Use: Never used  ?Substance and Sexual Activity  ? Alcohol use: No  ? Drug use: No  ? Sexual activity: Not on file  ?Other Topics Concern  ? Not on file  ?Social History Narrative  ? Lives at home with mother.  ? Caffeine use: Diet and caffeine free soda (8 drinks per day)  ?   ?   ? Epworth Sleepiness Scale = 7 (as of 01/26/2016)   ? ?Social Determinants of Health  ? ?Financial Resource Strain: Not on file  ?Food Insecurity: Not on file  ?Transportation Needs: Not on file  ?Physical Activity: Not on file  ?Stress: Not on file  ?Social Connections: Not on file  ?Intimate Partner Violence: Not on file  ? ? ? ? ?Review of systems: ?All other review of systems negative except as mentioned in the HPI. ? ? ?Physical Exam: ?Vitals:  ? 03/16/22 1053  ?BP: 130/80  ?Pulse: 98  ? ?Body mass index is 41.8 kg/m?. ?Gen:      No acute distress ?HEENT:  sclera anicteric ?Abd:      soft, non-tender;  no palpable masses, no distension ?Ext:    No edema ?Neuro: alert and oriented x 3 ?Psych: normal mood and affect ? ?Data Reviewed: ? ?Reviewed labs, radiology imaging, old records and pertinent past GI work up ? ? ?Assessment and Plan/Recommendations: ?71 year old very pleasant female with history of chronic GERD, H. pylori gastritis status post eradication 2017 here for follow-up of intermittent nausea, breakthrough GERD symptoms and dyspepsia  ? ?EGD with findings of chronic gastritis, negative for H. Pylori ?GERD: Continue Nexium 40 mg before breakfast and dinner ?Antireflux measures ? ?Dyspepsia: Use FD guard 1 capsule up to 3 times daily ?  ?Fatty liver:  metabolic secondary to central obesity  ?Avoid high carb and high fat diet ?Daily exercise ?  ?History of colon polyps: Due for surveillance colonoscopy June 2024 ?  ?Return in 3-4 months ? ?The patient was provided an opportunity to ask questions and all were answered. The patient agreed with the plan and demonstrated an understanding of the instructions. ? ?K. Denzil Magnuson , MD ?  ? ?CC: Crist Infante, MD ? ? ?

## 2022-07-06 NOTE — Progress Notes (Signed)
Cardiology Clinic Note   Patient Name: Shawna Gonzalez Date of Encounter: 07/07/2022  Primary Care Provider:  Crist Infante, MD Primary Cardiologist:  Dr. Debara Pickett   Patient Profile    71 year old female with history of hypertension, obesity, family history of heart disease, anemia, diabetes mellitus type 2, (diet-controlled), macular degeneration, hepatic stenosis, osteoporosis, OSA on CPAP, and chronic ulcerative colitis.  She was initially seen by cardiology for atypical chest discomfort with a coronary calcium score of 0 and no detectable coronary disease by CTA on 03/22/2021.  She was to follow-up as needed.  Past Medical History    Past Medical History:  Diagnosis Date   Adenomatous colon polyp    Anemia    history of   Arthritis    Asthma    Atypical chest pain    Bacterial ear infection 2018   Cancer ([place])    melanoma skin cancer 7 years ago   Cataract    Diabetes mellitus without complication ([place])    history of taking diabetic medication, has lost weight no longer needing medciation at this time   GERD (gastroesophageal reflux disease)    Glaucoma    Heart murmur    slight   Hypertension    Macular degeneration    Osteoporosis    Sleep apnea    cpap   Ulcerative colitis, chronic ([place])    Past Surgical History:  Procedure Laterality Date   CARPAL TUNNEL RELEASE Right 12/16/2009   CATARACT EXTRACTION W/ INTRAOCULAR LENS  IMPLANT, BILATERAL Bilateral 05/21/2007(Right); 06/04/2007(Left)   CHOLECYSTECTOMY  2000   COLONOSCOPY WITH PROPOFOL  01/23/2012   DE QUERVAIN'S RELEASE Right 11/30/2005   HYSTERECTOMY ABDOMINAL WITH SALPINGECTOMY Bilateral 01/09/2017   Procedure: HYSTERECTOMY ABDOMINAL WITH SALPINGOOPHORECTOMY;  Surgeon: Dian Queen, MD;  Location: [facility];  Service: Gynecology;  Laterality: Bilateral;   TONSILLECTOMY     TRIGGER FINGER RELEASE Right    UPPER GI ENDOSCOPY  03/05/2017   MAC    Allergies  Allergies  Allergen Reactions   Amlodipine Cough    Bydureon [Exenatide] Itching and Other (See Comments)    KNOTS IN SKIN   Lisinopril Cough   Lunesta [Eszopiclone] Other (See Comments)    Metallic taste in mouth   Sulfa Antibiotics Rash    History of Present Illness    Shawna Gonzalez is being seen today for ongoing assessment and management of hypertension, with h/o OSA on CPAP.  She comes today with multiple complaints of fatigue, shortness of breath with exertion, and chronic lower extremity edema.  She takes Lasix as needed.  She occasionally has chest discomfort although not severe.  She was reassured by cardiac CTA calcium score of 0.  Home Medications    Current Outpatient Medications  Medication Sig Dispense Refill   albuterol (PROVENTIL HFA;VENTOLIN HFA) 108 (90 BASE) MCG/ACT inhaler Inhale 2 puffs into the lungs every 6 (six) hours as needed for wheezing or shortness of breath.     budesonide-formoterol (SYMBICORT) 160-4.5 MCG/ACT inhaler Inhale 2 puffs into the lungs daily.     dicyclomine (BENTYL) 10 MG capsule Take 1 capsule (10 mg total) by mouth 4 (four) times daily -  before meals and at bedtime. 90 capsule 6   dorzolamide (TRUSOPT) 2 % ophthalmic solution INSTILL 1 DROP INTO BOTH EYES THREE TIMES DAILY 30 mL 0   esomeprazole (NEXIUM) 40 MG capsule Take 1 capsule (40 mg total) by mouth 2 (two) times daily. 60 capsule 6   furosemide (LASIX) 40 MG tablet  Take 40 mg by mouth every evening.   4   levocetirizine (XYZAL) 5 MG tablet Take 5 mg by mouth every evening.     losartan (COZAAR) 25 MG tablet Take 25 mg by mouth daily.     ondansetron (ZOFRAN) 4 MG tablet Take 1 tablet (4 mg total) by mouth every 8 (eight) hours as needed for nausea or vomiting. 10 tablet 0   potassium chloride SA (K-DUR,KLOR-CON) 20 MEQ tablet Take 20 mEq by mouth every Tuesday, Thursday, and Saturday at 6 PM.      traMADol (ULTRAM) 50 MG tablet Take 1 tablet (50 mg total) by mouth every 6 (six) hours as needed for moderate pain. 30 tablet 0    Travoprost, BAK Free, (TRAVATAN) 0.004 % SOLN ophthalmic solution PLACE 1 DROP INTO BOTH EYES NIGHTLY     Vitamin D, Ergocalciferol, (DRISDOL) 50000 UNITS CAPS Take 50,000 Units by mouth 2 (two) times a week. Sunday & Thursday     Current Facility-Administered Medications  Medication Dose Route Frequency Provider Last Rate Last Admin   0.9 %  sodium chloride infusion  500 mL Intravenous Once Nandigam, Venia Minks, MD         Family History    Family History  Problem Relation Age of Onset   Colon polyps Mother    Heart disease Mother    COPD Mother        early signs of   Colon polyps Father    Heart disease Father    Colon polyps Brother    Colon cancer Neg Hx    Esophageal cancer Neg Hx    Stomach cancer Neg Hx    Sleep apnea Neg Hx    She indicated that her mother is deceased. She indicated that her father is deceased. She indicated that her brother is alive. She indicated that her maternal grandmother is deceased. She indicated that her maternal grandfather is deceased. She indicated that her paternal grandmother is deceased. She indicated that her paternal grandfather is deceased. She indicated that the status of her neg hx is unknown.  Social History    Social History   Socioeconomic History   Marital status: Single    Spouse name: Not on file   Number of children: 0   Years of education: 16   Highest education level: Not on file  Occupational History   Occupation: customer service    Employer: [employer]  Tobacco Use   Smoking status: Never   Smokeless tobacco: Never  Vaping Use   Vaping Use: Never used  Substance and Sexual Activity   Alcohol use: No   Drug use: No   Sexual activity: Not on file  Other Topics Concern   Not on file  Social History Narrative   Lives at home with mother.   Caffeine use: Diet and caffeine free soda (8 drinks per day)         Epworth Sleepiness Scale = 7 (as of 01/26/2016)   Social Determinants of Health   Financial Resource  Strain: Not on file  Food Insecurity: Not on file  Transportation Needs: Not on file  Physical Activity: Not on file  Stress: Not on file  Social Connections: Not on file  Intimate Partner Violence: Not on file     Review of Systems    General:  No chills, fever, night sweats or weight changes.  Cardiovascular:  No chest pain, positive for dyspnea on exertion, occasional lower extremity edema, orthopnea, palpitations, paroxysmal nocturnal dyspnea. Dermatological:  No rash, lesions/masses Respiratory: No cough, dyspnea Urologic: No hematuria, dysuria Abdominal:   No nausea, vomiting, diarrhea, bright red blood per rectum, melena, or hematemesis Neurologic:  No visual changes, wkns, changes in mental status. All other systems reviewed and are otherwise negative except as noted above.     Physical Exam    VS:  BP 120/60   Pulse 83   Ht _0  (1.473 m)   Wt 207 lb 6.4 oz (94.1 kg)   SpO2 96%   BMI 43.35 kg/m  , BMI Body mass index is 43.35 kg/m.     GEN: Well nourished, well developed, in no acute distress.  Central obesity HEENT: normal.  Strabismus noted Neck: Supple, no JVD, carotid bruits, or masses. Cardiac: RRR, no murmurs, rubs, or gallops. No clubbing, cyanosis, edema.  Radials/DP/PT 2+ and equal bilaterally.  Respiratory:  Respirations regular and unlabored, clear to auscultation bilaterally. GI: Soft, nontender, nondistended, BS + x 4. MS: no deformity or atrophy. Skin: warm and dry, no rash. Neuro:  Strength and sensation are intact. Psych: Normal affect.  Accessory Clinical Findings    ECG personally reviewed by me today-normal sinus rhythm, heart rate of 83 bpm- No acute changes  Lab Results  Component Value Date   WBC 10.5 01/18/2022   HGB 14.8 01/18/2022   HCT 44.7 01/18/2022   MCV 83.4 01/18/2022   PLT 249.0 01/18/2022   Lab Results  Component Value Date   CREATININE 0.66 01/18/2022   BUN 11 01/18/2022   NA 139 01/18/2022   K 3.5 01/18/2022    CL 99 01/18/2022   CO2 33 (H) 01/18/2022   Lab Results  Component Value Date   ALT 15 01/18/2022   AST 15 01/18/2022   ALKPHOS 114 01/18/2022   BILITOT 0.8 01/18/2022   No results found for: "CHOL", "HDL", "LDLCALC", "LDLDIRECT", "TRIG", "CHOLHDL"  No results found for: "HGBA1C"  Review of Prior Studies: Cardiac CTA 03/22/2021   FINDINGS: Non-cardiac: See separate report from [facility]. No significant findings on limited lung and soft tissue windows.   Calcium score: No calcium noted   Coronary Arteries: Right dominant with no anomalies   LM: Normal   LAD: Normal tortuous small distally   IM: Normal   D1: Large branching vessel normal   Circumflex: Normal   OM1: Large vessel normal   RCA: Normal   PDA: Normal   PLA: Normal   IMPRESSION: 1. Calcium score 0   2.  Normal aortic root diameter 3.1 cm   3.  Normal right dominant coronary arteries   [place]  Assessment & Plan   1.  Dyspnea on exertion: May be due to deconditioning, and obesity.  However we will check echocardiogram in the setting of hypertension for evaluation of LV function.  We will check a CBC to evaluate for anemia.  2.  Hypertension: Currently well controlled.  No changes in her medication regimen at this time.  Continue Lasix 40 mg as needed losartan 25 mg daily.  Checking BMET   3.  OSA on CPAP: Followed by sleep medicine.  4.  Vitamin D deficiency: Checking vitamin D level with current blood draw to make available for PCPs review.    Current medicines are reviewed at length with the patient today.  I have spent 25 min's  dedicated to the care of this patient on the date of this encounter to include pre-visit review of records, assessment, management and diagnostic testing,with shared decision making. Signed, Phill Myron.  West Pugh, ANP, AACC   07/07/2022 12:57 PM    [facility] [address] Office [phone] Fax [phone]  Notice: This dictation was prepared with Dragon dictation along with smaller phrase technology. Any transcriptional errors that result from this process are unintentional and may not be corrected upon review.

## 2022-07-07 ENCOUNTER — Ambulatory Visit: Payer: Medicare Other | Admitting: Adult Health

## 2022-07-07 ENCOUNTER — Encounter: Payer: Self-pay | Admitting: Adult Health

## 2022-07-07 VITALS — BP 120/60 | HR 83 | Ht <= 58 in | Wt 207.4 lb

## 2022-07-07 DIAGNOSIS — R0602 Shortness of breath: Secondary | ICD-10-CM

## 2022-07-07 DIAGNOSIS — Z9989 Dependence on other enabling machines and devices: Secondary | ICD-10-CM

## 2022-07-07 DIAGNOSIS — I10 Essential (primary) hypertension: Secondary | ICD-10-CM | POA: Diagnosis not present

## 2022-07-07 DIAGNOSIS — G4733 Obstructive sleep apnea (adult) (pediatric): Secondary | ICD-10-CM | POA: Diagnosis not present

## 2022-07-07 DIAGNOSIS — R0609 Other forms of dyspnea: Secondary | ICD-10-CM

## 2022-07-07 NOTE — Patient Instructions (Signed)
Medication Instructions:  No Changes *If you need a refill on your cardiac medications before your next appointment, please call your pharmacy*   Lab Work: CMET,CBC, Vitamin -D Today If you have labs (blood work) drawn today and your tests are completely normal, you will receive your results only by: [place] (if you have MyChart) OR A paper copy in the mail If you have any lab test that is abnormal or we need to change your treatment, we will call you to review the results.   Testing/Procedures: [address], [address] Your physician has requested that you have an echocardiogram. Echocardiography is a painless test that uses sound waves to create images of your heart. It provides your doctor with information about the size and shape of your heart and how well your heart's chambers and valves are working. This procedure takes approximately one hour. There are no restrictions for this procedure.    Follow-Up: At [facility], you and your health needs are our priority.  As part of our continuing mission to provide you with exceptional heart care, we have created designated Provider Care Teams.  These Care Teams include your primary Cardiologist (physician) and Advanced Practice Providers (APPs -  Physician Assistants and Nurse Practitioners) who all work together to provide you with the care you need, when you need it.  We recommend signing up for the patient portal called "MyChart".  Sign up information is provided on this After Visit Summary.  MyChart is used to connect with patients for Virtual Visits (Telemedicine).  Patients are able to view lab/test results, encounter notes, upcoming appointments, etc.  Non-urgent messages can be sent to your provider as well.   To learn more about what you can do with MyChart, go to [email or web address].    Your next appointment:   1 month(s)  The format for your next appointment:   In Person  Provider:   Jory Sims,  NP    Important Information About Sugar

## 2022-07-08 LAB — CBC
Hematocrit: 40.9 % (ref 34.0–46.6)
Hemoglobin: 14 g/dL (ref 11.1–15.9)
MCH: 27.5 pg (ref 26.6–33.0)
MCHC: 34.2 g/dL (ref 31.5–35.7)
MCV: 80 fL (ref 79–97)
Platelets: 251 10*3/uL (ref 150–450)
RBC: 5.1 x10E6/uL (ref 3.77–5.28)
RDW: 14.9 % (ref 11.7–15.4)
WBC: 7.5 10*3/uL (ref 3.4–10.8)

## 2022-07-08 LAB — COMPREHENSIVE METABOLIC PANEL
ALT: 17 IU/L (ref 0–32)
AST: 20 IU/L (ref 0–40)
Albumin/Globulin Ratio: 1.6 (ref 1.2–2.2)
Albumin: 4.2 g/dL (ref 3.9–4.9)
Alkaline Phosphatase: 116 IU/L (ref 44–121)
BUN/Creatinine Ratio: 21 (ref 12–28)
BUN: 13 mg/dL (ref 8–27)
Bilirubin Total: 0.4 mg/dL (ref 0.0–1.2)
CO2: 22 mmol/L (ref 20–29)
Calcium: 9.1 mg/dL (ref 8.7–10.3)
Chloride: 100 mmol/L (ref 96–106)
Creatinine, Ser: 0.63 mg/dL (ref 0.57–1.00)
Globulin, Total: 2.7 g/dL (ref 1.5–4.5)
Glucose: 114 mg/dL — ABNORMAL HIGH (ref 70–99)
Potassium: 4.2 mmol/L (ref 3.5–5.2)
Sodium: 138 mmol/L (ref 134–144)
Total Protein: 6.9 g/dL (ref 6.0–8.5)
eGFR: 95 mL/min/{1.73_m2} (ref >59)

## 2022-07-13 ENCOUNTER — Telehealth: Payer: Self-pay

## 2022-07-13 NOTE — Telephone Encounter (Addendum)
Called patient regarding results. Patient had understanding of results.----- Message from Lendon Colonel, NP sent at 07/12/2022  4:35 PM EDT ----- I have reviewed the labs. No concerns here. Have her follow up with PCP. She already has appointment scheduled.   KL

## 2022-07-19 ENCOUNTER — Ambulatory Visit: Payer: Medicare Other | Admitting: Pulmonary Disease

## 2022-07-19 ENCOUNTER — Encounter: Payer: Self-pay | Admitting: Pulmonary Disease

## 2022-07-19 VITALS — BP 128/70 | HR 78 | Temp 98.4°F | Ht <= 58 in | Wt 205.4 lb

## 2022-07-19 DIAGNOSIS — R0609 Other forms of dyspnea: Secondary | ICD-10-CM | POA: Diagnosis not present

## 2022-07-19 DIAGNOSIS — J4540 Moderate persistent asthma, uncomplicated: Secondary | ICD-10-CM | POA: Diagnosis not present

## 2022-07-19 NOTE — Progress Notes (Signed)
$'@Patient'p$  ID: Shawna Gonzalez, female    DOB: 06/19/1951, 71 y.o.   MRN: [id number]  Chief Complaint  Patient presents with   Consult    Pt is here for consult for low oxygen levels. Pt states she wear cpap at night and she feels like her oxygen is low at night. She states on her pulse ox reads 88%-93% at home. Pt states that she has felt her oxygen getting low for about 6 weeks now. Pt states she also feels like she can not take a full complete breath. Pt is on Proair and Symbicort. But she states she does not taken them as prescribed.     Referring provider: Crist Infante, MD  HPI:   71 y.o. woman whom we are seeing in consultation for evaluation of cough, recurrent bronchitis.  Overall, doing well.  She states she checks her oxygen at home.  Usually 96 to 98%.  In the past when she had pneumonia is never dropped below 88%.  89% at 1 point time in the hospital.  She does describe dyspnea on exertion.  Comes and goes.  Worse on inclines or stairs.  No time of day when things are better or worse.  No seasonal environmental factors she can identify that make things better or worse.  No position make things better or worse.  Sometimes relieved with albuterol.  Use albuterol and Symbicort as needed at this point.  Do think they are a bit beneficial when she is feeling short of breath.  Sensation at times at rest with difficulty getting a full breath and a deep breath in.  She has history of asthma.  [place] seems recurrent bronchitis.  Cough for many weeks.  Was worse when she was working but retired a couple years ago from [employer].  But does tend to have them at least once a year now.  1-2 times yearly prior.  Usually between Thanksgiving and New Year's.  Again, she does not use maintenance inhaler regularly.  Most recent chest imaging CT coronary scan 03/2021 with clear lungs bilaterally on my review interpretation of available lung fields.  PMH: Asthma, GERD, Surgical history: Carpal tunnel  surgery, cataract surgery, cholecystectomy, tonsillectomy Family history: Mother with lung cancer, COPD, CAD, father with CAD Social history: Never smoker, lives in [place] / Pulmonary Flowsheets:   ACT:      No data to display          MMRC:     No data to display          Epworth:      No data to display          Tests:   FENO:  No results found for: "NITRICOXIDE"  PFT:     No data to display          WALK:      No data to display          Imaging: Personally reviewed and as per EMR discussion in this note No results found.  Lab Results: Personally reviewed CBC    Component Value Date/Time   WBC 7.5 07/07/2022 1205   WBC 10.5 01/18/2022 1419   RBC 5.10 07/07/2022 1205   RBC 5.36 (H) 01/18/2022 1419   HGB 14.0 07/07/2022 1205   HCT 40.9 07/07/2022 1205   PLT 251 07/07/2022 1205   MCV 80 07/07/2022 1205   MCH 27.5 07/07/2022 1205   MCH 27.9 01/10/2017 0514   MCHC 34.2 07/07/2022 1205  MCHC 33.2 01/18/2022 1419   RDW 14.9 07/07/2022 1205   LYMPHSABS 2.5 01/18/2022 1419   MONOABS 0.8 01/18/2022 1419   EOSABS 0.1 01/18/2022 1419   BASOSABS 0.1 01/18/2022 1419    BMET    Component Value Date/Time   NA 138 07/07/2022 1205   K 4.2 07/07/2022 1205   CL 100 07/07/2022 1205   CO2 22 07/07/2022 1205   GLUCOSE 114 (H) 07/07/2022 1205   GLUCOSE 99 01/18/2022 1419   BUN 13 07/07/2022 1205   CREATININE 0.63 07/07/2022 1205   CALCIUM 9.1 07/07/2022 1205   GFRNONAA >60 01/09/2017 1137   GFRAA >60 01/09/2017 1137    BNP No results found for: "BNP"  ProBNP No results found for: "PROBNP"  Specialty Problems       Pulmonary Problems   HIATAL HERNIA    Qualifier: Diagnosis of  By: Laney Potash, Pam        SLEEP APNEA    Qualifier: History of  By: Laney Potash, Pam        Chronic cough   Asthma, chronic   OSA (obstructive sleep apnea)   Respiratory failure with hypoxia and hypercapnia (HCC)    Asthma with acute exacerbation   OSA on CPAP   Nocturnal hypoxemia   Obesity hypoventilation syndrome (HCC)    Allergies  Allergen Reactions   Amlodipine Cough   Bydureon [Exenatide] Itching and Other (See Comments)    KNOTS IN SKIN   Lisinopril Cough   Lunesta [Eszopiclone] Other (See Comments)    Metallic taste in mouth   Sulfa Antibiotics Rash    Immunization History  Administered Date(s) Administered   Influenza Whole 09/17/2012   Influenza,inj,quad, With Preservative 11/21/2017, 11/09/2020   Influenza-Unspecified 12/22/2016   Pneumococcal Polysaccharide-23 09/18/2007    Past Medical History:  Diagnosis Date   Adenomatous colon polyp    Anemia    history of   Arthritis    Asthma    Atypical chest pain    Bacterial ear infection 2018   Cancer ([place])    melanoma skin cancer 7 years ago   Cataract    Diabetes mellitus without complication ([place])    history of taking diabetic medication, has lost weight no longer needing medciation at this time   GERD (gastroesophageal reflux disease)    Glaucoma    Heart murmur    slight   Hypertension    Macular degeneration    Osteoporosis    Sleep apnea    cpap   Ulcerative colitis, chronic (HCC)     Tobacco History: Social History   Tobacco Use  Smoking Status Never  Smokeless Tobacco Never   Counseling given: Not Answered   Continue to not smoke  Outpatient Encounter Medications as of 07/19/2022  Medication Sig   albuterol (PROVENTIL HFA;VENTOLIN HFA) 108 (90 BASE) MCG/ACT inhaler Inhale 2 puffs into the lungs every 6 (six) hours as needed for wheezing or shortness of breath.   budesonide-formoterol (SYMBICORT) 160-4.5 MCG/ACT inhaler Inhale 2 puffs into the lungs daily.   dicyclomine (BENTYL) 10 MG capsule Take 1 capsule (10 mg total) by mouth 4 (four) times daily -  before meals and at bedtime.   dorzolamide (TRUSOPT) 2 % ophthalmic solution INSTILL 1 DROP INTO BOTH EYES THREE TIMES DAILY   esomeprazole  (NEXIUM) 40 MG capsule Take 1 capsule (40 mg total) by mouth 2 (two) times daily.   furosemide (LASIX) 40 MG tablet Take 40 mg by mouth every evening.    levocetirizine (XYZAL)  5 MG tablet Take 5 mg by mouth every evening.   losartan (COZAAR) 25 MG tablet Take 25 mg by mouth daily.   ondansetron (ZOFRAN) 4 MG tablet Take 1 tablet (4 mg total) by mouth every 8 (eight) hours as needed for nausea or vomiting.   potassium chloride SA (K-DUR,KLOR-CON) 20 MEQ tablet Take 20 mEq by mouth every Tuesday, Thursday, and Saturday at 6 PM.    traMADol (ULTRAM) 50 MG tablet Take 1 tablet (50 mg total) by mouth every 6 (six) hours as needed for moderate pain.   Travoprost, BAK Free, (TRAVATAN) 0.004 % SOLN ophthalmic solution PLACE 1 DROP INTO BOTH EYES NIGHTLY   Vitamin D, Ergocalciferol, (DRISDOL) 50000 UNITS CAPS Take 50,000 Units by mouth 2 (two) times a week. Sunday & Thursday   Facility-Administered Encounter Medications as of 07/19/2022  Medication   0.9 %  sodium chloride infusion     Review of Systems  Review of Systems  No chest pain with exertion.  No orthopnea or PND.  Comprehensive review of systems otherwise negative. Physical Exam  BP 128/70 (BP Location: Left Arm, Patient Position: Sitting, Cuff Size: Normal)   Pulse 78   Temp 98.4 F (36.9 C) (Oral)   Ht '4\' 10"'$  (1.473 m)   Wt 205 lb 6.4 oz (93.2 kg)   SpO2 95%   BMI 42.93 kg/m   Wt Readings from Last 5 Encounters:  07/19/22 205 lb 6.4 oz (93.2 kg)  07/07/22 207 lb 6.4 oz (94.1 kg)  03/16/22 200 lb (90.7 kg)  02/08/22 202 lb (91.6 kg)  02/01/22 201 lb 12.8 oz (91.5 kg)    BMI Readings from Last 5 Encounters:  07/19/22 42.93 kg/m  07/07/22 43.35 kg/m  03/16/22 41.80 kg/m  02/08/22 42.22 kg/m  02/01/22 42.18 kg/m     Physical Exam General: Sitting in chair, no acute distress Eyes: EOMI, no icterus Neck: Supple, no JVP appreciated Pulmonary: Clear, normal work of breathing Cardiovascular warm, no edema Abdomen:  Nondistended, bowel sounds present MSK: No synovitis, no joint effusion Neuro: Normal gait, no weakness Psych: Normal mood, full affect   Assessment & Plan:   Dyspnea on exertion: Suspect multifactorial.  Possibly poorly controlled asthma given her recurrent episodes of bronchitis to the year.  Also concern for weight and deconditioning contributing.  Cardiology evaluation in the past has been reassuring.  Pulmonary function test for further evaluation.  Asthma: Day-to-day seems fairly well controlled but overall think poorly controlled given bouts of recurrent bronchitis at least twice a year.  Often in the winter.  Stressed the importance of maintenance inhaler use.  She is not using Symbicort and albuterol as needed.  After much discussion we agreed to use Symbicort 2 puffs twice a day every day for the next few months.  Hopefully will prevent bronchitis or minimize severity of bronchitis or upper respiratory illness in the coming months.  Continue albuterol as needed.   Return in about 3 months (around 10/19/2022).   Lanier Clam, MD 07/19/2022

## 2022-07-19 NOTE — Patient Instructions (Signed)
Nice to meet you  To further evaluate your symptoms I have ordered pulmonary function test.  Please schedule this at your convenience.  In effort to treat asthma and minimize episodes of bronchitis, I recommend using your Symbicort 2 puffs twice a day every day.  Even if you are feeling well.  Rinse your mouth out after every use.  Return to clinic in 3 months or sooner as needed with Dr. Silas Flood

## 2022-07-20 ENCOUNTER — Ambulatory Visit (HOSPITAL_COMMUNITY): Payer: Medicare Other | Attending: Adult Health

## 2022-07-20 DIAGNOSIS — I10 Essential (primary) hypertension: Secondary | ICD-10-CM | POA: Diagnosis present

## 2022-07-20 DIAGNOSIS — R0602 Shortness of breath: Secondary | ICD-10-CM | POA: Diagnosis present

## 2022-07-20 LAB — ECHOCARDIOGRAM COMPLETE
AR max vel: 2.63 cm2
AV Area VTI: 2.52 cm2
AV Area mean vel: 2.52 cm2
AV Mean grad: 5 mmHg
AV Peak grad: 9.3 mmHg
Ao pk vel: 1.53 m/s
Area-P 1/2: 3.34 cm2
S' Lateral: 2 cm

## 2022-07-26 ENCOUNTER — Telehealth: Payer: Self-pay

## 2022-07-26 NOTE — Telephone Encounter (Addendum)
Called patient regarding results. Patient had understanding of results.----- Message from Lendon Colonel, NP sent at 07/21/2022  7:19 AM EDT ----- Reviewed echo. Normal pumping and valves.  No reason seen for shortness of breath.  No new orders  KL

## 2022-08-10 NOTE — Progress Notes (Signed)
Cardiology Clinic Note   Patient Name: Shawna Gonzalez Date of Encounter: 08/11/2022  Primary Care Provider:  Crist Infante, MD Primary Cardiologist:  None  Patient Profile    71 year old female with history of hypertension, obesity, family history of heart disease, anemia, diabetes mellitus type 2, (diet-controlled), macular degeneration, hepatic stenosis, osteoporosis, OSA on CPAP, and chronic ulcerative colitis.    She was initially seen by cardiology for atypical chest discomfort with a coronary calcium score of 0 and no detectable coronary disease by CTA on 03/22/2021. Being followed by pulmonary for chronic bronchitis and asthma on albuterol and Symbicort. On last office visit, echocardiogram was ordered.  Past Medical History    Past Medical History:  Diagnosis Date   Adenomatous colon polyp    Anemia    history of   Arthritis    Asthma    Atypical chest pain    Bacterial ear infection 2018   Cancer ([place])    melanoma skin cancer 7 years ago   Cataract    Diabetes mellitus without complication ([place])    history of taking diabetic medication, has lost weight no longer needing medciation at this time   GERD (gastroesophageal reflux disease)    Glaucoma    Heart murmur    slight   Hypertension    Macular degeneration    Osteoporosis    Sleep apnea    cpap   Ulcerative colitis, chronic ([place])    Past Surgical History:  Procedure Laterality Date   CARPAL TUNNEL RELEASE Right 12/16/2009   CATARACT EXTRACTION W/ INTRAOCULAR LENS  IMPLANT, BILATERAL Bilateral 05/21/2007(Right); 06/04/2007(Left)   CHOLECYSTECTOMY  2000   COLONOSCOPY WITH PROPOFOL  01/23/2012   DE QUERVAIN'S RELEASE Right 11/30/2005   HYSTERECTOMY ABDOMINAL WITH SALPINGECTOMY Bilateral 01/09/2017   Procedure: HYSTERECTOMY ABDOMINAL WITH SALPINGOOPHORECTOMY;  Surgeon: Dian Queen, MD;  Location: [facility];  Service: Gynecology;  Laterality: Bilateral;   TONSILLECTOMY     TRIGGER FINGER RELEASE Right     UPPER GI ENDOSCOPY  03/05/2017   MAC    Allergies  Allergies  Allergen Reactions   Amlodipine Cough   Bydureon [Exenatide] Itching and Other (See Comments)    KNOTS IN SKIN   Lisinopril Cough   Lunesta [Eszopiclone] Other (See Comments)    Metallic taste in mouth   Sulfa Antibiotics Rash    History of Present Illness    Shawna Gonzalez is a very pleasant 71 year old female we are seeing on follow-up status post echocardiogram, and ongoing assessment and management of essential hypertension, with complaints of chronic shortness of breath.  Echocardiogram completed on 07/20/2022 revealed normal LV systolic function, no evidence of elevated right ventricular pressure or pulmonary hypertension.  She has been seen by pulmonary who have provided her with Symbicort inhaler which she is taking twice daily, and having planned PFTs.  She has seen her PCP recently and is noted that her glucose has been elevated.  Uncertain if this is related to inhaled steroids.  She does admit to dietary noncompliance with low-cholesterol, low carbohydrate meals.  She is committed to changing her eating habits based upon the labs.  Home Medications    Current Outpatient Medications  Medication Sig Dispense Refill   albuterol (PROVENTIL HFA;VENTOLIN HFA) 108 (90 BASE) MCG/ACT inhaler Inhale 2 puffs into the lungs every 6 (six) hours as needed for wheezing or shortness of breath.     budesonide-formoterol (SYMBICORT) 160-4.5 MCG/ACT inhaler Inhale 2 puffs into the lungs daily.     dicyclomine (  BENTYL) 10 MG capsule Take 1 capsule (10 mg total) by mouth 4 (four) times daily -  before meals and at bedtime. 90 capsule 6   dorzolamide (TRUSOPT) 2 % ophthalmic solution INSTILL 1 DROP INTO BOTH EYES THREE TIMES DAILY 30 mL 0   esomeprazole (NEXIUM) 40 MG capsule Take 1 capsule (40 mg total) by mouth 2 (two) times daily. 60 capsule 6   furosemide (LASIX) 40 MG tablet Take 40 mg by mouth every evening.   4    levocetirizine (XYZAL) 5 MG tablet Take 5 mg by mouth every evening.     losartan (COZAAR) 25 MG tablet Take 25 mg by mouth daily.     ondansetron (ZOFRAN) 4 MG tablet Take 1 tablet (4 mg total) by mouth every 8 (eight) hours as needed for nausea or vomiting. 10 tablet 0   potassium chloride SA (K-DUR,KLOR-CON) 20 MEQ tablet Take 20 mEq by mouth every Tuesday, Thursday, and Saturday at 6 PM.      traMADol (ULTRAM) 50 MG tablet Take 1 tablet (50 mg total) by mouth every 6 (six) hours as needed for moderate pain. 30 tablet 0   Travoprost, BAK Free, (TRAVATAN) 0.004 % SOLN ophthalmic solution PLACE 1 DROP INTO BOTH EYES NIGHTLY     Vitamin D, Ergocalciferol, (DRISDOL) 50000 UNITS CAPS Take 50,000 Units by mouth 2 (two) times a week. Sunday & Thursday     Current Facility-Administered Medications  Medication Dose Route Frequency Provider Last Rate Last Admin   0.9 %  sodium chloride infusion  500 mL Intravenous Once Nandigam, Venia Minks, MD         Family History    Family History  Problem Relation Age of Onset   Colon polyps Mother    Heart disease Mother    COPD Mother        early signs of   Colon polyps Father    Heart disease Father    Colon polyps Brother    Colon cancer Neg Hx    Esophageal cancer Neg Hx    Stomach cancer Neg Hx    Sleep apnea Neg Hx    She indicated that her mother is deceased. She indicated that her father is deceased. She indicated that her brother is alive. She indicated that her maternal grandmother is deceased. She indicated that her maternal grandfather is deceased. She indicated that her paternal grandmother is deceased. She indicated that her paternal grandfather is deceased. She indicated that the status of her neg hx is unknown.  Social History    Social History   Socioeconomic History   Marital status: Single    Spouse name: Not on file   Number of children: 0   Years of education: 16   Highest education level: Not on file  Occupational History    Occupation: customer service    Employer: [employer]  Tobacco Use   Smoking status: Never   Smokeless tobacco: Never  Vaping Use   Vaping Use: Never used  Substance and Sexual Activity   Alcohol use: No   Drug use: No   Sexual activity: Not on file  Other Topics Concern   Not on file  Social History Narrative   Lives at home with mother.   Caffeine use: Diet and caffeine free soda (8 drinks per day)         Epworth Sleepiness Scale = 7 (as of 01/26/2016)   Social Determinants of Health   Financial Resource Strain: Not on file  Food  Insecurity: Not on file  Transportation Needs: Not on file  Physical Activity: Not on file  Stress: Not on file  Social Connections: Not on file  Intimate Partner Violence: Not on file     Review of Systems    General:  No chills, fever, night sweats or weight changes.  Cardiovascular:  No chest pain, dyspnea on exertion, edema, orthopnea, palpitations, paroxysmal nocturnal dyspnea. Dermatological: No rash, lesions/masses Respiratory: No cough, dyspnea Urologic: No hematuria, dysuria Abdominal:   No nausea, vomiting, diarrhea, bright red blood per rectum, melena, or hematemesis Neurologic:  No visual changes, wkns, changes in mental status. All other systems reviewed and are otherwise negative except as noted above.     Physical Exam    VS:  BP 134/68   Pulse 76   Ht _0  (1.473 m)   Wt 207 lb (93.9 kg)   SpO2 98%   BMI 43.26 kg/m  , BMI Body mass index is 43.26 kg/m.     GEN: Well nourished, well developed, in no acute distress.  Obese HEENT: normal. Neck: Supple, no JVD, carotid bruits, or masses. Cardiac: RRR, no murmurs, rubs, or gallops. No clubbing, cyanosis, edema.  Radials/DP/PT 2+ and equal bilaterally.  Respiratory:  Respirations regular and unlabored, bibasilar crackles without wheezing  GI: Soft, nontender, nondistended, BS + x 4. MS: no deformity or atrophy. Skin: warm and dry, no rash. Neuro:  Strength and  sensation are intact. Psych: Normal affect.  Accessory Clinical Findings     Lab Results  Component Value Date   WBC 7.5 07/07/2022   HGB 14.0 07/07/2022   HCT 40.9 07/07/2022   MCV 80 07/07/2022   PLT 251 07/07/2022   Lab Results  Component Value Date   CREATININE 0.63 07/07/2022   BUN 13 07/07/2022   NA 138 07/07/2022   K 4.2 07/07/2022   CL 100 07/07/2022   CO2 22 07/07/2022   Lab Results  Component Value Date   ALT 17 07/07/2022   AST 20 07/07/2022   ALKPHOS 116 07/07/2022   BILITOT 0.4 07/07/2022   No results found for: "CHOL", "HDL", "LDLCALC", "LDLDIRECT", "TRIG", "CHOLHDL"  No results found for: "HGBA1C"  Review of Prior Studies: Echocardiogram 07/20/2022  1. Left ventricular ejection fraction, by estimation, is 60 to 65%. The  left ventricle has normal function. The left ventricle has no regional  wall motion abnormalities. Left ventricular diastolic parameters were  normal.   2. Right ventricular systolic function is normal. The right ventricular  size is normal.   3. The mitral valve is normal in structure. Trivial mitral valve  regurgitation. No evidence of mitral stenosis.   4. The aortic valve has an indeterminant number of cusps. Aortic valve  regurgitation is not visualized. No aortic stenosis is present.   5. The inferior vena cava is normal in size with greater than 50%  respiratory variability, suggesting right atrial pressure of 3 mmHg.  Cardiac CTA 03/22/2021    FINDINGS: Non-cardiac: See separate report from [facility]. No significant findings on limited lung and soft tissue windows.   Calcium score: No calcium noted   Coronary Arteries: Right dominant with no anomalies   LM: Normal   LAD: Normal tortuous small distally   IM: Normal   D1: Large branching vessel normal   Circumflex: Normal   OM1: Large vessel normal   RCA: Normal   PDA: Normal   PLA: Normal   IMPRESSION: 1. Calcium score 0   2.  Normal aortic  root diameter 3.1 cm   3.  Normal right dominant coronary arteries   [place]  Assessment & Plan   1.  Chronic shortness of breath: Echocardiogram is reassuring as she has normal LV function and no evidence of pulmonary hypertension.  Being seen by pulmonology given inhaled steroids, with plan PFTs.  She will see [place] in 1 year unless she has further symptoms.  2.  Noncardiac chest pain: Cardiac CTA on 03/22/2021 revealed a calcium score of 0, and normal right dominant coronary arteries.  Continue primary prevention with weight loss, cholesterol management, and diabetes management.  3.  Hypertension: Blood pressure is currently well controlled on regimen.  No plan changes.  Continue losartan 25 mg daily.  Would avoid beta-blockers in the setting of potential lung disease.  Current medicines are reviewed at length with the patient today.  I have spent 25 min's  dedicated to the care of this patient on the date of this encounter to include pre-visit review of records, assessment, management and diagnostic testing,with shared decision making.  Signed, Phill Myron. [place] Pugh, ANP, AACC   08/11/2022 1:57 PM      Office [phone] Fax [phone]  Notice: This dictation was prepared with Dragon dictation along with smaller phrase technology. Any transcriptional errors that result from this process are unintentional and may not be corrected upon review.

## 2022-08-11 ENCOUNTER — Encounter: Payer: Self-pay | Admitting: Adult Health

## 2022-08-11 ENCOUNTER — Ambulatory Visit: Payer: Medicare Other | Admitting: Adult Health

## 2022-08-11 VITALS — BP 134/68 | HR 76 | Ht <= 58 in | Wt 207.0 lb

## 2022-08-11 DIAGNOSIS — I10 Essential (primary) hypertension: Secondary | ICD-10-CM

## 2022-08-11 DIAGNOSIS — R0609 Other forms of dyspnea: Secondary | ICD-10-CM

## 2022-08-11 DIAGNOSIS — R0789 Other chest pain: Secondary | ICD-10-CM

## 2022-08-11 NOTE — Patient Instructions (Signed)
Medication Instructions:  Your physician recommends that you continue on your current medications as directed. Please refer to the Current Medication list given to you today.  *If you need a refill on your cardiac medications before your next appointment, please call your pharmacy*   Lab Work: NONE If you have labs (blood work) drawn today and your tests are completely normal, you will receive your results only by: [place] (if you have MyChart) OR A paper copy in the mail If you have any lab test that is abnormal or we need to change your treatment, we will call you to review the results.   Testing/Procedures: NONE   Follow-Up: At [facility], you and your health needs are our priority.  As part of our continuing mission to provide you with exceptional heart care, we have created designated Provider Care Teams.  These Care Teams include your primary Cardiologist (physician) and Advanced Practice Providers (APPs -  Physician Assistants and Nurse Practitioners) who all work together to provide you with the care you need, when you need it.  We recommend signing up for the patient portal called "MyChart".  Sign up information is provided on this After Visit Summary.  MyChart is used to connect with patients for Virtual Visits (Telemedicine).  Patients are able to view lab/test results, encounter notes, upcoming appointments, etc.  Non-urgent messages can be sent to your provider as well.   To learn more about what you can do with MyChart, go to [email or web address].    Your next appointment:   1 year(s)  The format for your next appointment:   In Person  Provider:   Lyman Bishop, MD {

## 2022-08-16 ENCOUNTER — Ambulatory Visit (INDEPENDENT_AMBULATORY_CARE_PROVIDER_SITE_OTHER): Payer: Medicare Other | Admitting: Pulmonary Disease

## 2022-08-16 DIAGNOSIS — R0609 Other forms of dyspnea: Secondary | ICD-10-CM | POA: Diagnosis not present

## 2022-08-16 LAB — PULMONARY FUNCTION TEST
DL/VA % pred: 108 %
DL/VA: 4.73 ml/min/mmHg/L
DLCO cor % pred: 95 %
DLCO cor: 14.39 ml/min/mmHg
DLCO unc % pred: 96 %
DLCO unc: 14.65 ml/min/mmHg
FEF 25-75 Post: 2.38 L/sec
FEF 25-75 Pre: 2.5 L/sec
FEF2575-%Change-Post: -4 %
FEF2575-%Pred-Post: 156 %
FEF2575-%Pred-Pre: 164 %
FEV1-%Change-Post: 5 %
FEV1-%Pred-Post: 97 %
FEV1-%Pred-Pre: 92 %
FEV1-Post: 1.6 L
FEV1-Pre: 1.52 L
FEV1FVC-%Change-Post: 5 %
FEV1FVC-%Pred-Pre: 117 %
FEV6-%Change-Post: 0 %
FEV6-%Pred-Post: 81 %
FEV6-%Pred-Pre: 81 %
FEV6-Post: 1.7 L
FEV6-Pre: 1.69 L
FEV6FVC-%Pred-Post: 105 %
FEV6FVC-%Pred-Pre: 105 %
FVC-%Change-Post: 0 %
FVC-%Pred-Post: 77 %
FVC-%Pred-Pre: 78 %
FVC-Post: 1.7 L
FVC-Pre: 1.71 L
Post FEV1/FVC ratio: 94 %
Post FEV6/FVC ratio: 100 %
Pre FEV1/FVC ratio: 89 %
Pre FEV6/FVC Ratio: 100 %
RV % pred: 53 %
RV: 0.99 L
TLC % pred: 86 %
TLC: 3.48 L

## 2022-08-16 NOTE — Progress Notes (Signed)
Full PFT Performed Today  

## 2022-08-16 NOTE — Patient Instructions (Signed)
Full PFT Performed Today  

## 2022-08-16 NOTE — Progress Notes (Signed)
PFT are normal - this is good news

## 2022-08-24 IMAGING — CT CT HEART MORP W/ CTA COR W/ SCORE W/ CA W/CM &/OR W/O CM
4 of 7 series · 8 of 20 positions shown, 9 images · non-contrast
Comparison: None.
COMPARISON: None.

Addendum:
EXAM:
OVER-READ INTERPRETATION  CT CHEST

The following report is an over-read performed by radiologist Dr.
Omaira Foreign [REDACTED] on 03/22/2021. This
over-read does not include interpretation of cardiac or coronary
anatomy or pathology. The coronary calcium score/coronary CTA
interpretation by the cardiologist is attached.
CLINICAL DATA: Chest pain
Cardiac CTA
MEDICATIONS:
Sub lingual nitro. 4 mg and lopressor 100mg
TECHNIQUE: The patient was scanned on a Siemens Force 192 scanner. Gantry
rotation speed was 250 msecs. Collimation was. 6 mm . A 120 kV
prospective scan was triggered in the ascending thoracic aorta at
140 HU's with full mA between 30-70% of the R-R interval . Average
HR during the scan was 53 bpm. The 3D data set was interpreted on a
dedicated work station using MPR, MIP and VRT modes. A total of 80
cc of contrast was used.

[Series 6: best diast 76 % · axial · 0.39mm/px · z∈[-184,-149]mm · 2 of 263 slices shown]
[im 88/263  vessel]
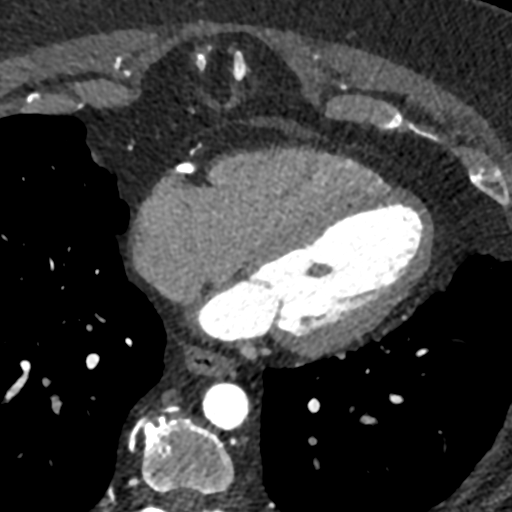
[im 175/263  vessel]
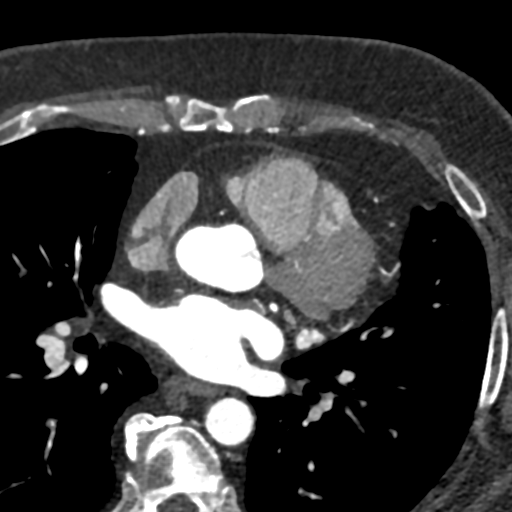

[Series 7: best syst · axial · 0.39mm/px · z∈[-184,-149]mm · 2 of 263 slices shown, 3 images]
[im 88/263  vessel]
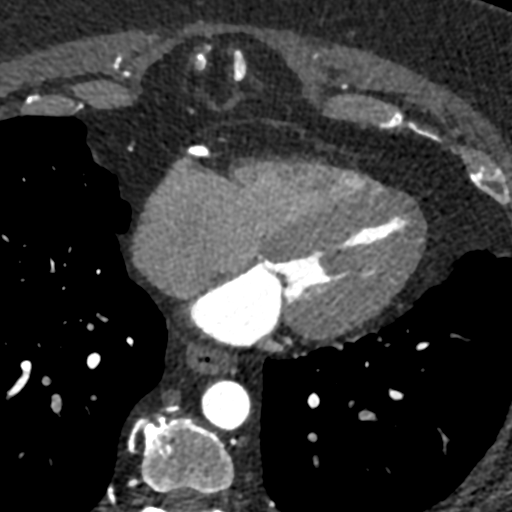
[im 88/263  lung]
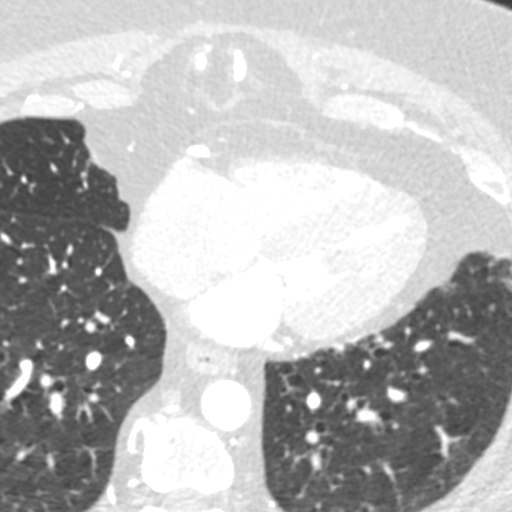
[im 175/263  vessel]
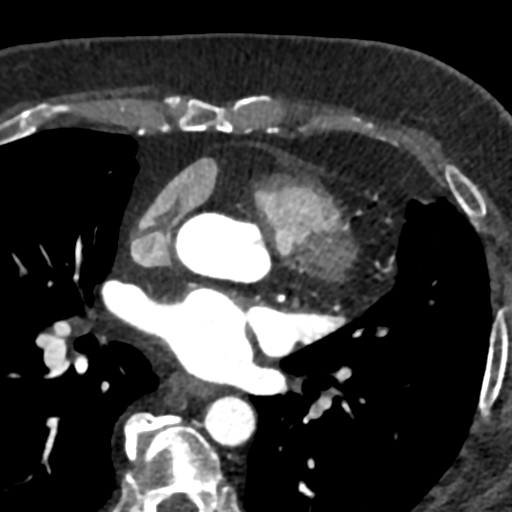

[Series 9: ts diast sharp 76 % · axial · 0.39mm/px · z∈[-184,-149]mm · 2 of 263 slices shown]
[im 88/263  lung]
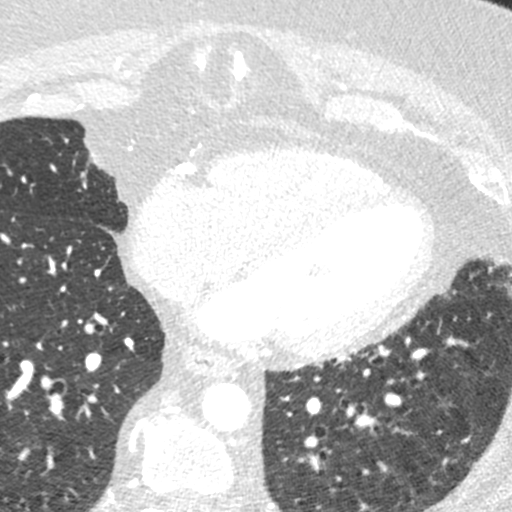
[im 175/263  lung]
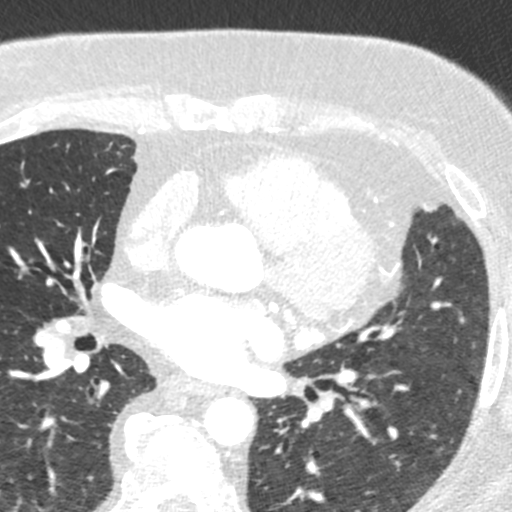

[Series 10: ts syst sharp · axial · 0.39mm/px · z∈[-184,-149]mm · 2 of 263 slices shown]
[im 88/263  lung]
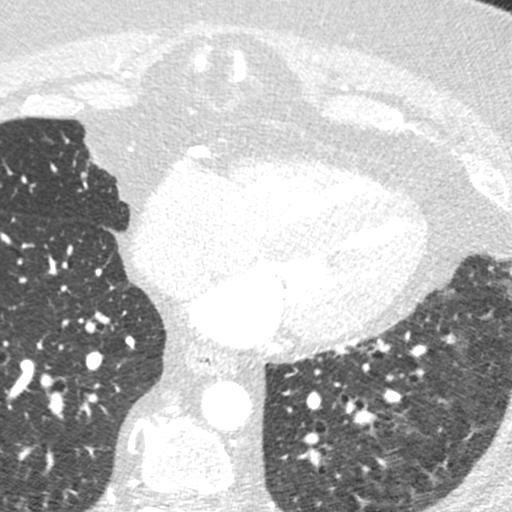
[im 175/263  lung]
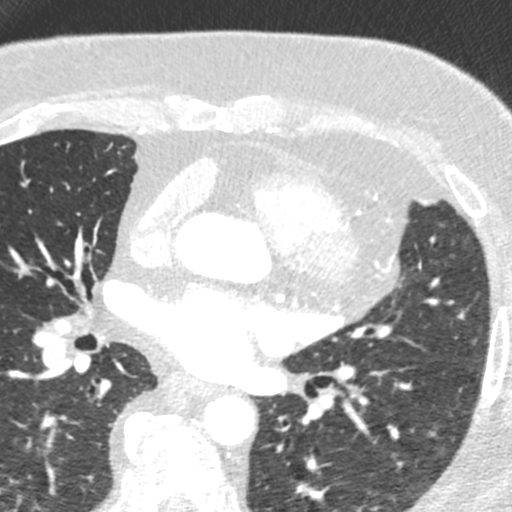

[8 of 20 positions shown; findings below may reference images not displayed]

FINDINGS: Within the visualized portions of the thorax there are no suspicious
appearing pulmonary nodules or masses, there is no acute
consolidative airspace disease, no pleural effusions, no
pneumothorax and no lymphadenopathy. Visualized portions of the
upper abdomen demonstrates diffuse low attenuation throughout the
visualized hepatic parenchyma, indicative of hepatic steatosis.
There are no aggressive appearing lytic or blastic lesions noted in
the visualized portions of the skeleton.
IMPRESSION: 1. Hepatic steatosis.
FINDINGS: Non-cardiac: See separate report from [REDACTED]. No
significant findings on limited lung and soft tissue windows.

Calcium score: No calcium noted

Coronary Arteries: Right dominant with no anomalies

LM: Normal

LAD: Normal tortuous small distally

IM: Normal

D1: Large branching vessel normal

Circumflex: Normal

OM1: Large vessel normal

RCA: Normal

PDA: Normal

PLA: Normal
IMPRESSION: 1. Calcium score 0

2.  Normal aortic root diameter 3.1 cm

3.  Normal right dominant coronary arteries

[facility] [place] Hujan

*** End of Addendum ***
EXAM:
OVER-READ INTERPRETATION  CT CHEST

The following report is an over-read performed by radiologist Dr.
Omaira Foreign [REDACTED] on 03/22/2021. This
over-read does not include interpretation of cardiac or coronary
anatomy or pathology. The coronary calcium score/coronary CTA
interpretation by the cardiologist is attached.
FINDINGS: Within the visualized portions of the thorax there are no suspicious
appearing pulmonary nodules or masses, there is no acute
consolidative airspace disease, no pleural effusions, no
pneumothorax and no lymphadenopathy. Visualized portions of the
upper abdomen demonstrates diffuse low attenuation throughout the
visualized hepatic parenchyma, indicative of hepatic steatosis.
There are no aggressive appearing lytic or blastic lesions noted in
the visualized portions of the skeleton.
IMPRESSION: 1. Hepatic steatosis.

## 2022-10-03 ENCOUNTER — Other Ambulatory Visit: Payer: Self-pay | Admitting: Gastroenterology

## 2022-10-18 ENCOUNTER — Encounter: Payer: Self-pay | Admitting: Pulmonary Disease

## 2022-10-18 ENCOUNTER — Ambulatory Visit: Payer: Medicare Other | Admitting: Pulmonary Disease

## 2022-10-18 VITALS — BP 130/64 | HR 86 | Ht <= 58 in | Wt 195.0 lb

## 2022-10-18 DIAGNOSIS — J4540 Moderate persistent asthma, uncomplicated: Secondary | ICD-10-CM

## 2022-10-18 DIAGNOSIS — R0609 Other forms of dyspnea: Secondary | ICD-10-CM

## 2022-10-18 MED ORDER — TRELEGY ELLIPTA 200-62.5-25 MCG/ACT IN AEPB: 1.0000 | INHALATION_SPRAY | Freq: Every day | RESPIRATORY_TRACT | 0 refills | Status: DC

## 2022-10-18 NOTE — Progress Notes (Signed)
$'@Patient'i$  ID: Shawna Gonzalez, female    DOB: 12/30/1950, 71 y.o.   MRN: [id number]  Chief Complaint  Patient presents with   Follow-up    Pt is here for follow up for DOE. Pt states she has been doing well lately. But she went to the beach and some people were sick. Pt is on Symbicort daily and proair as needed. No covid noted. Pt states her breathing is ok but she was having a little congestion. She feels like she is not getting a deep enough breath.     Referring provider: Crist Infante, MD  HPI:   71 y.o. woman with asthma whom we are seeing in follow up for evaluation of cough, recurrent bronchitis.  Most recent cardiology note reviewed.  Returns for routine follow-up.  Overall doing well.  Reports good adherence to Symbicort.  Breathing and asthma seem to doing fine.  Unclear how much is helped with her shortness of breath.  Seems like on questioning has beneficial.  However when she gets ill she notices a sensation of difficulty in a deep breath.  Unsatisfactory breath.  Last week she contracted the viral illness it sounds like.  She is able to get over this on her own.  Historically has requested antibiotics and steroids from her PCP.  She did not do this at this time.  Her symptoms are slowly improving.  Discussed this could be an encouraging sign but routine use of Symbicort is actually helping reduce severity of her illnesses, infections, URIs, bronchitis when they occur.  Discussed that given some dissatisfaction with symptoms escalation in inhaler therapy.  She is amenable to this today.  HPI at initial visit: Overall, doing well.  She states she checks her oxygen at home.  Usually 96 to 98%.  In the past when she had pneumonia is never dropped below 88%.  89% at 1 point time in the hospital.  She does describe dyspnea on exertion.  Comes and goes.  Worse on inclines or stairs.  No time of day when things are better or worse.  No seasonal environmental factors she can identify that  make things better or worse.  No position make things better or worse.  Sometimes relieved with albuterol.  Use albuterol and Symbicort as needed at this point.  Do think they are a bit beneficial when she is feeling short of breath.  Sensation at times at rest with difficulty getting a full breath and a deep breath in.  She has history of asthma.  [place] seems recurrent bronchitis.  Cough for many weeks.  Was worse when she was working but retired a couple years ago from [employer].  But does tend to have them at least once a year now.  1-2 times yearly prior.  Usually between Thanksgiving and New Year's.  Again, she does not use maintenance inhaler regularly.  Most recent chest imaging CT coronary scan 03/2021 with clear lungs bilaterally on my review interpretation of available lung fields.  PMH: Asthma, GERD, Surgical history: Carpal tunnel surgery, cataract surgery, cholecystectomy, tonsillectomy Family history: Mother with lung cancer, COPD, CAD, father with CAD Social history: Never smoker, lives in [place] / Pulmonary Flowsheets:   ACT:      No data to display           MMRC:     No data to display           Epworth:      No data to display  Tests:   FENO:  No results found for: "NITRICOXIDE"  PFT:    Latest Ref Rng & Units 08/16/2022    2:59 PM  PFT Results  FVC-Pre L 1.71   FVC-Predicted Pre % 78   FVC-Post L 1.70   FVC-Predicted Post % 77   Pre FEV1/FVC % % 89   Post FEV1/FCV % % 94   FEV1-Pre L 1.52   FEV1-Predicted Pre % 92   FEV1-Post L 1.60   DLCO uncorrected ml/min/mmHg 14.65   DLCO UNC% % 96   DLCO corrected ml/min/mmHg 14.39   DLCO COR %Predicted % 95   DLVA Predicted % 108   TLC L 3.48   TLC % Predicted % 86   RV % Predicted % 53   Personally reviewed interpreted normal spirometry, no bronchodilator response, lung volumes within normal limits with exception of severely reduced ERV, DLCO within normal  limits  WALK:      No data to display           Imaging: Personally reviewed and as per EMR discussion in this note No results found.  Lab Results: Personally reviewed CBC    Component Value Date/Time   WBC 7.5 07/07/2022 1205   WBC 10.5 01/18/2022 1419   RBC 5.10 07/07/2022 1205   RBC 5.36 (H) 01/18/2022 1419   HGB 14.0 07/07/2022 1205   HCT 40.9 07/07/2022 1205   PLT 251 07/07/2022 1205   MCV 80 07/07/2022 1205   MCH 27.5 07/07/2022 1205   MCH 27.9 01/10/2017 0514   MCHC 34.2 07/07/2022 1205   MCHC 33.2 01/18/2022 1419   RDW 14.9 07/07/2022 1205   LYMPHSABS 2.5 01/18/2022 1419   MONOABS 0.8 01/18/2022 1419   EOSABS 0.1 01/18/2022 1419   BASOSABS 0.1 01/18/2022 1419    BMET    Component Value Date/Time   NA 138 07/07/2022 1205   K 4.2 07/07/2022 1205   CL 100 07/07/2022 1205   CO2 22 07/07/2022 1205   GLUCOSE 114 (H) 07/07/2022 1205   GLUCOSE 99 01/18/2022 1419   BUN 13 07/07/2022 1205   CREATININE 0.63 07/07/2022 1205   CALCIUM 9.1 07/07/2022 1205   GFRNONAA >60 01/09/2017 1137   GFRAA >60 01/09/2017 1137    BNP No results found for: "BNP"  ProBNP No results found for: "PROBNP"  Specialty Problems       Pulmonary Problems   HIATAL HERNIA    Qualifier: Diagnosis of  By: Laney Potash, Pam        SLEEP APNEA    Qualifier: History of  By: Laney Potash, Pam        Chronic cough   Asthma, chronic   OSA (obstructive sleep apnea)   Respiratory failure with hypoxia and hypercapnia (HCC)   Asthma with acute exacerbation   OSA on CPAP   Nocturnal hypoxemia   Obesity hypoventilation syndrome (HCC)    Allergies  Allergen Reactions   Amlodipine Cough   Bydureon [Exenatide] Itching and Other (See Comments)    KNOTS IN SKIN   Lisinopril Cough   Lunesta [Eszopiclone] Other (See Comments)    Metallic taste in mouth   Sulfa Antibiotics Rash    Immunization History  Administered Date(s) Administered   Influenza Whole 09/17/2012    Influenza,inj,quad, With Preservative 11/21/2017, 11/09/2020   Influenza-Unspecified 12/22/2016   Pneumococcal Polysaccharide-23 09/18/2007    Past Medical History:  Diagnosis Date   Adenomatous colon polyp    Anemia    history of   Arthritis  Asthma    Atypical chest pain    Bacterial ear infection 2018   Cancer ([place])    melanoma skin cancer 7 years ago   Cataract    Diabetes mellitus without complication ([place])    history of taking diabetic medication, has lost weight no longer needing medciation at this time   GERD (gastroesophageal reflux disease)    Glaucoma    Heart murmur    slight   Hypertension    Macular degeneration    Osteoporosis    Sleep apnea    cpap   Ulcerative colitis, chronic (HCC)     Tobacco History: Social History   Tobacco Use  Smoking Status Never  Smokeless Tobacco Never   Counseling given: Not Answered   Continue to not smoke  Outpatient Encounter Medications as of 10/18/2022  Medication Sig   albuterol (PROVENTIL HFA;VENTOLIN HFA) 108 (90 BASE) MCG/ACT inhaler Inhale 2 puffs into the lungs every 6 (six) hours as needed for wheezing or shortness of breath.   budesonide-formoterol (SYMBICORT) 160-4.5 MCG/ACT inhaler Inhale 2 puffs into the lungs daily.   dicyclomine (BENTYL) 10 MG capsule TAKE 1 CAPSULE(10 MG) BY MOUTH FOUR TIMES DAILY BEFORE MEALS AND AT BEDTIME   dorzolamide (TRUSOPT) 2 % ophthalmic solution INSTILL 1 DROP INTO BOTH EYES THREE TIMES DAILY   esomeprazole (NEXIUM) 40 MG capsule Take 1 capsule (40 mg total) by mouth 2 (two) times daily.   furosemide (LASIX) 40 MG tablet Take 40 mg by mouth every evening.    levocetirizine (XYZAL) 5 MG tablet Take 5 mg by mouth every evening.   losartan (COZAAR) 25 MG tablet Take 25 mg by mouth daily.   ondansetron (ZOFRAN) 4 MG tablet Take 1 tablet (4 mg total) by mouth every 8 (eight) hours as needed for nausea or vomiting.   potassium chloride SA (K-DUR,KLOR-CON) 20 MEQ tablet Take 20  mEq by mouth every Tuesday, Thursday, and Saturday at 6 PM.    traMADol (ULTRAM) 50 MG tablet Take 1 tablet (50 mg total) by mouth every 6 (six) hours as needed for moderate pain.   Travoprost, BAK Free, (TRAVATAN) 0.004 % SOLN ophthalmic solution PLACE 1 DROP INTO BOTH EYES NIGHTLY   TYMLOS 3120 MCG/1.56ML SOPN Inject into the skin.   Vitamin D, Ergocalciferol, (DRISDOL) 50000 UNITS CAPS Take 50,000 Units by mouth 2 (two) times a week. Sunday & Thursday   Facility-Administered Encounter Medications as of 10/18/2022  Medication   0.9 %  sodium chloride infusion     Review of Systems  Review of Systems  N/a Physical Exam  BP 130/64 (BP Location: Left Arm, Patient Position: Sitting, Cuff Size: Normal)   Pulse 86   Ht '4\' 10"'$  (1.473 m)   Wt 195 lb (88.5 kg)   SpO2 98%   BMI 40.76 kg/m   Wt Readings from Last 5 Encounters:  10/18/22 195 lb (88.5 kg)  08/11/22 207 lb (93.9 kg)  07/19/22 205 lb 6.4 oz (93.2 kg)  07/07/22 207 lb 6.4 oz (94.1 kg)  03/16/22 200 lb (90.7 kg)    BMI Readings from Last 5 Encounters:  10/18/22 40.76 kg/m  08/11/22 43.26 kg/m  07/19/22 42.93 kg/m  07/07/22 43.35 kg/m  03/16/22 41.80 kg/m     Physical Exam General: Sitting in chair, no acute distress Eyes: EOMI, no icterus Neck: Supple, no JVP appreciated Pulmonary: Clear, normal work of breathing Cardiovascular warm, no edema Abdomen: Nondistended, bowel sounds present MSK: No synovitis, no joint effusion Neuro: Normal gait, no  weakness Psych: Normal mood, full affect   Assessment & Plan:   Dyspnea on exertion: Suspect multifactorial.  Possibly poorly controlled asthma given her recurrent episodes of bronchitis to the year.  Also concern for weight and deconditioning contributing.  Cardiology evaluation in the past has been reassuring.  PFTs within normal limits 07/2019 30.  Asthma: Day-to-day seems fairly well controlled but overall think poorly controlled given bouts of recurrent  bronchitis at least twice a year.  Often in the winter.  Stressed the importance of maintenance inhaler use.  With good adherence to Symbicort twice a day it seems like severity of her recent viral illness was less than usual, did not require prednisone or antibiotics.  Still with relatively severe symptoms when she is ill with what sound like viral illnesses.  Escalate Symbicort to high-dose Trelegy 1 puff daily.  If beneficial will prescribe, samples provided today.   Return in about 3 months (around 01/18/2023).   Lanier Clam, MD 10/18/2022

## 2022-10-18 NOTE — Progress Notes (Deleted)
[place]

## 2022-10-18 NOTE — Patient Instructions (Signed)
See you again  I am glad you are feeling a bit better over the last couple of days  Lets try the Trelegy inhaler.  Use 1 puff once a day.  Rinse your mouth out with water after use.  Do not use Symbicort while using your Trelegy.  If you find it beneficial, let me know and I will send a prescription.  I provided samples for a 28-day supply of the Trelegy.  It is not beneficial or you have side effects or adverse reaction, please let me know and stop the Trelegy and resume the Symbicort.  Return to clinic in 3 months or sooner as needed with Dr. Silas Flood

## 2023-01-30 ENCOUNTER — Ambulatory Visit (INDEPENDENT_AMBULATORY_CARE_PROVIDER_SITE_OTHER): Payer: Medicare Other

## 2023-01-30 ENCOUNTER — Encounter: Payer: Self-pay | Admitting: Podiatry

## 2023-01-30 ENCOUNTER — Ambulatory Visit: Payer: Medicare Other | Admitting: Podiatry

## 2023-01-30 DIAGNOSIS — L600 Ingrowing nail: Secondary | ICD-10-CM

## 2023-01-30 DIAGNOSIS — D2371 Other benign neoplasm of skin of right lower limb, including hip: Secondary | ICD-10-CM | POA: Diagnosis not present

## 2023-01-30 DIAGNOSIS — M2041 Other hammer toe(s) (acquired), right foot: Secondary | ICD-10-CM

## 2023-01-30 DIAGNOSIS — M2042 Other hammer toe(s) (acquired), left foot: Secondary | ICD-10-CM | POA: Diagnosis not present

## 2023-01-30 DIAGNOSIS — D2372 Other benign neoplasm of skin of left lower limb, including hip: Secondary | ICD-10-CM | POA: Diagnosis not present

## 2023-01-30 MED ORDER — NEOMYCIN-POLYMYXIN-HC 1 % OT SOLN: OTIC | 1 refills | Status: AC

## 2023-01-30 NOTE — Progress Notes (Signed)
She presents today complaining of pain to the fifth digits bilaterally she states that they are under lapping the fourth toes.  She is also complaining of painful ingrown toenail to the tibial border of the hallux left.  Objective: Vital signs are stable alert oriented x 3.  Pulses are palpable.  She has osteoarthritic changes at the first metatarsophalangeal joint limiting range of motion with mild hallux valgus deformity.  She does have adductovarus rotated hammertoe deformities fifth bilateral which is resulting in painful clavi to the distal lateral aspect of the foot.  She is also has painful ingrown toenail to the tibial border of the hallux left exquisitely tender on palpation.  Assessment: Ingrown toenail hallux left adductovarus rotated hammertoe deformities fifth bilateral.  Painful benign skin lesions.  Plan: Discussed etiology pathology and surgical therapies chemical matricectomy was performed to the tibial border tolerated procedure well was given both oral and written home-going structure for care and soaking of the toe.  Debrided all benign reactive hyperkeratotic tissue and debrided nails.  Follow-up with her in 2 to 3 weeks.

## 2023-01-30 NOTE — Patient Instructions (Signed)

## 2023-02-06 ENCOUNTER — Telehealth: Payer: Medicare Other | Admitting: Adult Health

## 2023-02-06 NOTE — Progress Notes (Unsigned)
PATIENT: Shawna Gonzalez DOB: 1951-03-11  REASON FOR VISIT: follow up HISTORY FROM: patient PRIMARY NEUROLOGIST: Dr. Brett Fairy  HISTORY OF PRESENT ILLNESS: Today 02/06/23: Shawna Gonzalez is a 72 y.o. female with a history of OSA on CPAP. Returns today for follow-up.       02/01/22: Shawna Gonzalez is a 72 year old female with a history of OSA on CPAP. She returns today for follow-up. CPAP is working well. Mask does make a mark on her face but doesn't want to change mask at this time.   REVIEW OF SYSTEMS: Out of a complete 14 system review of symptoms, the patient complains only of the following symptoms, and all other reviewed systems are negative.  ESS 0  ALLERGIES: Allergies  Allergen Reactions   Amlodipine Cough   Bydureon [Exenatide] Itching and Other (See Comments)    KNOTS IN SKIN   Lisinopril Cough   Lunesta [Eszopiclone] Other (See Comments)    Metallic taste in mouth   Sulfa Antibiotics Rash    HOME MEDICATIONS: Outpatient Medications Prior to Visit  Medication Sig Dispense Refill   albuterol (PROVENTIL HFA;VENTOLIN HFA) 108 (90 BASE) MCG/ACT inhaler Inhale 2 puffs into the lungs every 6 (six) hours as needed for wheezing or shortness of breath.     atorvastatin (LIPITOR) 10 MG tablet Take 1 tablet by mouth daily.     budesonide-formoterol (SYMBICORT) 160-4.5 MCG/ACT inhaler Inhale 2 puffs into the lungs daily.     dicyclomine (BENTYL) 10 MG capsule TAKE 1 CAPSULE(10 MG) BY MOUTH FOUR TIMES DAILY BEFORE MEALS AND AT BEDTIME 90 capsule 6   dorzolamide (TRUSOPT) 2 % ophthalmic solution INSTILL 1 DROP INTO BOTH EYES THREE TIMES DAILY 30 mL 0   esomeprazole (NEXIUM) 40 MG capsule Take 1 capsule (40 mg total) by mouth 2 (two) times daily. 60 capsule 6   Fluticasone-Umeclidin-Vilant (TRELEGY ELLIPTA) 200-62.5-25 MCG/ACT AEPB Inhale 1 puff into the lungs daily. 1 each 0   furosemide (LASIX) 40 MG tablet Take 40 mg by mouth every evening.   4   levocetirizine  (XYZAL) 5 MG tablet Take 5 mg by mouth every evening.     losartan (COZAAR) 25 MG tablet Take 25 mg by mouth daily.     NEOMYCIN-POLYMYXIN-HYDROCORTISONE (CORTISPORIN) 1 % SOLN OTIC solution Apply 1-2 drops to toe BID after soaking 10 mL 1   ondansetron (ZOFRAN) 4 MG tablet Take 1 tablet (4 mg total) by mouth every 8 (eight) hours as needed for nausea or vomiting. 10 tablet 0   potassium chloride SA (K-DUR,KLOR-CON) 20 MEQ tablet Take 20 mEq by mouth every Tuesday, Thursday, and Saturday at 6 PM.      traMADol (ULTRAM) 50 MG tablet Take 1 tablet (50 mg total) by mouth every 6 (six) hours as needed for moderate pain. 30 tablet 0   Travoprost, BAK Free, (TRAVATAN) 0.004 % SOLN ophthalmic solution PLACE 1 DROP INTO BOTH EYES NIGHTLY     TYMLOS 3120 MCG/1.56ML SOPN Inject into the skin.     Vitamin D, Ergocalciferol, (DRISDOL) 50000 UNITS CAPS Take 50,000 Units by mouth 2 (two) times a week. Sunday & Thursday     Facility-Administered Medications Prior to Visit  Medication Dose Route Frequency Provider Last Rate Last Admin   0.9 %  sodium chloride infusion  500 mL Intravenous Once Nandigam, Venia Minks, MD        PAST MEDICAL HISTORY: Past Medical History:  Diagnosis Date   Adenomatous colon polyp    Anemia  history of   Arthritis    Asthma    Atypical chest pain    Bacterial ear infection 2018   Cancer ([place])    melanoma skin cancer 7 years ago   Cataract    Diabetes mellitus without complication ([place])    history of taking diabetic medication, has lost weight no longer needing medciation at this time   GERD (gastroesophageal reflux disease)    Glaucoma    Heart murmur    slight   Hypertension    Macular degeneration    Osteoporosis    Sleep apnea    cpap   Ulcerative colitis, chronic ([place])     PAST SURGICAL HISTORY: Past Surgical History:  Procedure Laterality Date   CARPAL TUNNEL RELEASE Right 12/16/2009   CATARACT EXTRACTION W/ INTRAOCULAR LENS  IMPLANT, BILATERAL  Bilateral 05/21/2007(Right); 06/04/2007(Left)   CHOLECYSTECTOMY  2000   COLONOSCOPY WITH PROPOFOL  01/23/2012   DE QUERVAIN'S RELEASE Right 11/30/2005   HYSTERECTOMY ABDOMINAL WITH SALPINGECTOMY Bilateral 01/09/2017   Procedure: HYSTERECTOMY ABDOMINAL WITH SALPINGOOPHORECTOMY;  Surgeon: Dian Queen, MD;  Location: [facility];  Service: Gynecology;  Laterality: Bilateral;   TONSILLECTOMY     TRIGGER FINGER RELEASE Right    UPPER GI ENDOSCOPY  03/05/2017   MAC    FAMILY HISTORY: Family History  Problem Relation Age of Onset   Colon polyps Mother    Heart disease Mother    COPD Mother        early signs of   Colon polyps Father    Heart disease Father    Colon polyps Brother    Colon cancer Neg Hx    Esophageal cancer Neg Hx    Stomach cancer Neg Hx    Sleep apnea Neg Hx     SOCIAL HISTORY: Social History   Socioeconomic History   Marital status: Single    Spouse name: Not on file   Number of children: 0   Years of education: 16   Highest education level: Not on file  Occupational History   Occupation: customer service    Employer: [employer]  Tobacco Use   Smoking status: Never   Smokeless tobacco: Never  Vaping Use   Vaping Use: Never used  Substance and Sexual Activity   Alcohol use: No   Drug use: No   Sexual activity: Not on file  Other Topics Concern   Not on file  Social History Narrative   Lives at home with mother.   Caffeine use: Diet and caffeine free soda (8 drinks per day)         Epworth Sleepiness Scale = 7 (as of 01/26/2016)   Social Determinants of Health   Financial Resource Strain: Not on file  Food Insecurity: Not on file  Transportation Needs: Not on file  Physical Activity: Not on file  Stress: Not on file  Social Connections: Not on file  Intimate Partner Violence: Not on file      PHYSICAL EXAM  There were no vitals filed for this visit.  There is no height or weight on file to calculate BMI.  Generalized: Well developed, in  no acute distress  Chest: Lungs clear to auscultation bilaterally  Neurological examination  Mentation: Alert oriented to time, place, history taking. Follows all commands speech and language fluent Cranial nerve II-XII: Extraocular movements were full, visual field were full on confrontational test Head turning and shoulder shrug  were normal and symmetric. Motor: The motor testing reveals 5 over 5 strength of all 4  extremities. Good symmetric motor tone is noted throughout.  Sensory: Sensory testing is intact to soft touch on all 4 extremities. No evidence of extinction is noted.  Gait and station: Gait is normal.    DIAGNOSTIC DATA (LABS, IMAGING, TESTING) - I reviewed patient records, labs, notes, testing and imaging myself where available.  Lab Results  Component Value Date   WBC 7.5 07/07/2022   HGB 14.0 07/07/2022   HCT 40.9 07/07/2022   MCV 80 07/07/2022   PLT 251 07/07/2022      Component Value Date/Time   NA 138 07/07/2022 1205   K 4.2 07/07/2022 1205   CL 100 07/07/2022 1205   CO2 22 07/07/2022 1205   GLUCOSE 114 (H) 07/07/2022 1205   GLUCOSE 99 01/18/2022 1419   BUN 13 07/07/2022 1205   CREATININE 0.63 07/07/2022 1205   CALCIUM 9.1 07/07/2022 1205   PROT 6.9 07/07/2022 1205   ALBUMIN 4.2 07/07/2022 1205   AST 20 07/07/2022 1205   ALT 17 07/07/2022 1205   ALKPHOS 116 07/07/2022 1205   BILITOT 0.4 07/07/2022 1205   GFRNONAA >60 01/09/2017 1137   GFRAA >60 01/09/2017 1137    Lab Results  Component Value Date   VITAMINB12 376 01/16/2007       ASSESSMENT AND PLAN 72 y.o. year old female  has a past medical history of Adenomatous colon polyp, Anemia, Arthritis, Asthma, Atypical chest pain, Bacterial ear infection (2018), Cancer ([place]), Cataract, Diabetes mellitus without complication ([place]), GERD (gastroesophageal reflux disease), Glaucoma, Heart murmur, Hypertension, Macular degeneration, Osteoporosis, Sleep apnea, and Ulcerative colitis, chronic ([place]). here  with:  OSA on CPAP  - CPAP compliance excellent - Good treatment of AHI  - Encourage patient to use CPAP nightly and > 4 hours each night - F/U in 1 year or sooner if needed    Ward Givens, MSN, NP-C 02/06/2023, 5:21 PM [facility] [address], [place], [place] [address] [phone]

## 2023-02-07 ENCOUNTER — Encounter: Payer: Self-pay | Admitting: Adult Health

## 2023-02-07 ENCOUNTER — Ambulatory Visit (INDEPENDENT_AMBULATORY_CARE_PROVIDER_SITE_OTHER): Payer: Medicare Other | Admitting: Adult Health

## 2023-02-07 VITALS — BP 134/82 | HR 94 | Ht <= 58 in | Wt 202.0 lb

## 2023-02-07 DIAGNOSIS — G4733 Obstructive sleep apnea (adult) (pediatric): Secondary | ICD-10-CM | POA: Diagnosis not present

## 2023-02-07 NOTE — Patient Instructions (Signed)
Continue using CPAP nightly and greater than 4 hours each night Can consider trazodone for sleep in the future  If your symptoms worsen or you develop new symptoms please let us know.

## 2023-02-12 ENCOUNTER — Ambulatory Visit: Payer: Medicare Other | Admitting: Podiatry

## 2023-02-13 ENCOUNTER — Ambulatory Visit: Payer: Medicare Other | Admitting: Podiatry

## 2023-02-13 ENCOUNTER — Encounter: Payer: Self-pay | Admitting: Podiatry

## 2023-02-13 DIAGNOSIS — L600 Ingrowing nail: Secondary | ICD-10-CM

## 2023-02-13 DIAGNOSIS — Z9889 Other specified postprocedural states: Secondary | ICD-10-CM

## 2023-02-13 NOTE — Progress Notes (Signed)
She presents today for nail check left.  She denies fever chills nausea vomit states is doing just great just a little bit tender.  She is referring to the left hallux along the tibial border.  Objective: There is mild erythema around the proximal nail fold no purulence on palpation of the area mild tenderness on palpation.  No signs of ingrown toenail.  Assessment: Well-healing chemical matricectomy tibial border hallux left.  Plan: Continue to soak Epsom salts and warm water daily or every other day.  Covered in the daytime with a Band-Aid with shoes and socks.  She will take the Band-Aid off at night.  Follow-up with me with any regression

## 2023-03-01 ENCOUNTER — Telehealth: Payer: Self-pay | Admitting: Internal Medicine

## 2023-03-01 ENCOUNTER — Encounter: Payer: Self-pay | Admitting: Cardiovascular Disease

## 2023-03-01 NOTE — Telephone Encounter (Signed)
Error

## 2023-03-01 NOTE — Telephone Encounter (Signed)
Spoke with patient of Dr. Debara Pickett. She reports elevated BP and palps, more noticeable since starting Tymlos daily injections. Her BP has been as high as 153/93. She reports about 30 mins after taking injection she has a pressure in her chest that goes up to her head. She reports feeling exhausted after walking up and down the driveway.This has been going on for a couple of months. She has been on Tymlos since Sept. She has a viist on 3/22 with Curt Bears DNP.   Routed to Dr. Debara Pickett and Curt Bears DNP to review

## 2023-03-01 NOTE — Telephone Encounter (Signed)
Patient c/o Palpitations:  High priority if patient c/o lightheadedness, shortness of breath, or chest pain  How long have you had palpitations/irregular HR/ Afib? Are you having the symptoms now?  " A couple of months, not sure if its related to new prescriptions or not"   Are you currently experiencing lightheadedness, SOB or CP? No. She states she has a pressure in her chest in head but not at the moment.   Do you have a history of afib (atrial fibrillation) or irregular heart rhythm?   Have you checked your BP or HR? (document readings if available):  159/93  135/92   Are you experiencing any other symptoms? No   Pt asked to make an appt with Dr. Debara Pickett, he is booked so she sch with Purcell Nails, NP 03/09/23. She would like to make Dr. Debara Pickett of her symptoms, if he has any other suggestions.

## 2023-03-01 NOTE — Telephone Encounter (Signed)
Could be related to Tymlos based on the package insert - may need monitoring. Appreciate you arranging appt with Curt Bears on 3/22.    -Mali  ----- Message -----  From: Fidel Levy, RN  Sent: 03/01/2023   3:37 PM EDT  To: Pixie Casino, MD; Lendon Colonel, NP   Spoke with patient about MD advice. She said [employer] had notified her of these potential SE. Advised she could check into [employer] for remote ECG monitoring. Advised she check BPs at home and bring log to her 3/22 visit.

## 2023-03-08 NOTE — Progress Notes (Signed)
Cardiology Clinic Note   Patient Name: Shawna Gonzalez Date of Encounter: 03/09/2023  Primary Care Provider:  Crist Infante, MD Primary Cardiologist:  Dr. Debara Pickett   Patient Profile    72 year old with history of HTN,obesity, family history of heart disease, anemia, diabetes mellitus type 2, (diet-controlled), macular degeneration, hepatic stenosis, osteoporosis, OSA on CPAP, and chronic ulcerative colitis. Coronary calcium score of 0 and no detectable coronary disease by CTA on 03/22/2021. Echocardiogram completed on 07/20/2022 revealed normal LV systolic function, no evidence of elevated right ventricular pressure or pulmonary hypertension. Followed by pulmonary.   Past Medical History    Past Medical History:  Diagnosis Date   Adenomatous colon polyp    Anemia    history of   Arthritis    Asthma    Atypical chest pain    Bacterial ear infection 2018   Cancer ([place])    melanoma skin cancer 7 years ago   Cataract    Diabetes mellitus without complication ([place])    history of taking diabetic medication, has lost weight no longer needing medciation at this time   GERD (gastroesophageal reflux disease)    Glaucoma    Heart murmur    slight   Hypertension    Macular degeneration    Osteoporosis    Sleep apnea    cpap   Ulcerative colitis, chronic ([place])    Past Surgical History:  Procedure Laterality Date   CARPAL TUNNEL RELEASE Right 12/16/2009   CATARACT EXTRACTION W/ INTRAOCULAR LENS  IMPLANT, BILATERAL Bilateral 05/21/2007(Right); 06/04/2007(Left)   CHOLECYSTECTOMY  2000   COLONOSCOPY WITH PROPOFOL  01/23/2012   DE QUERVAIN'S RELEASE Right 11/30/2005   HYSTERECTOMY ABDOMINAL WITH SALPINGECTOMY Bilateral 01/09/2017   Procedure: HYSTERECTOMY ABDOMINAL WITH SALPINGOOPHORECTOMY;  Surgeon: Dian Queen, MD;  Location: [facility];  Service: Gynecology;  Laterality: Bilateral;   TONSILLECTOMY     TRIGGER FINGER RELEASE Right    UPPER GI ENDOSCOPY  03/05/2017   MAC     Allergies  Allergies  Allergen Reactions   Amlodipine Cough   Aspirin     Other Reaction(s): stopped 2018 due to gi  issues but not sure that stopping it made any real difference.   Bydureon [Exenatide] Itching and Other (See Comments)    KNOTS IN SKIN   Lisinopril Cough   Lunesta [Eszopiclone] Other (See Comments)    Metallic taste in mouth   Zoledronic Acid     Other Reaction(s): suspected ONJ issues 7/17   Sulfa Antibiotics Rash    History of Present Illness  She comes today without any new cardiac complaints.  She is not tolerating Tymlos for osteoporosis very well as it is causing some fullness in her chest head and neck for about 30 minutes after taking it.  She also has persistent dyspnea on exertion.  She is followed by pulmonary has had PFTs and has inhalers available to her daily and as needed.  Home Medications    Current Outpatient Medications  Medication Sig Dispense Refill   albuterol (PROVENTIL HFA;VENTOLIN HFA) 108 (90 BASE) MCG/ACT inhaler Inhale 2 puffs into the lungs every 6 (six) hours as needed for wheezing or shortness of breath.     atorvastatin (LIPITOR) 10 MG tablet Take 1 tablet by mouth daily.     budesonide-formoterol (SYMBICORT) 160-4.5 MCG/ACT inhaler Inhale 2 puffs into the lungs daily.     dicyclomine (BENTYL) 10 MG capsule TAKE 1 CAPSULE(10 MG) BY MOUTH FOUR TIMES DAILY BEFORE MEALS AND AT BEDTIME 90 capsule  6   dorzolamide (TRUSOPT) 2 % ophthalmic solution INSTILL 1 DROP INTO BOTH EYES THREE TIMES DAILY 30 mL 0   esomeprazole (NEXIUM) 40 MG capsule Take 1 capsule (40 mg total) by mouth 2 (two) times daily. 60 capsule 6   Fluticasone-Umeclidin-Vilant (TRELEGY ELLIPTA) 200-62.5-25 MCG/ACT AEPB Inhale 1 puff into the lungs daily. 1 each 0   furosemide (LASIX) 40 MG tablet Take 40 mg by mouth every evening.   4   levocetirizine (XYZAL) 5 MG tablet Take 5 mg by mouth every evening.     losartan (COZAAR) 25 MG tablet Take 25 mg by mouth daily.      NEOMYCIN-POLYMYXIN-HYDROCORTISONE (CORTISPORIN) 1 % SOLN OTIC solution Apply 1-2 drops to toe BID after soaking 10 mL 1   ondansetron (ZOFRAN) 4 MG tablet Take 1 tablet (4 mg total) by mouth every 8 (eight) hours as needed for nausea or vomiting. 10 tablet 0   traMADol (ULTRAM) 50 MG tablet Take 1 tablet (50 mg total) by mouth every 6 (six) hours as needed for moderate pain. 30 tablet 0   Travoprost, BAK Free, (TRAVATAN) 0.004 % SOLN ophthalmic solution PLACE 1 DROP INTO BOTH EYES NIGHTLY     triamcinolone cream (KENALOG) 0.1 % as needed (rash).     TYMLOS 3120 MCG/1.56ML SOPN Inject into the skin. Every 2 years     Vitamin D, Ergocalciferol, (DRISDOL) 50000 UNITS CAPS Take 50,000 Units by mouth 2 (two) times a week. Sunday & Thursday     Current Facility-Administered Medications  Medication Dose Route Frequency Provider Last Rate Last Admin   0.9 %  sodium chloride infusion  500 mL Intravenous Once Nandigam, Venia Minks, MD         Family History    Family History  Problem Relation Age of Onset   Colon polyps Mother    Heart disease Mother    COPD Mother        early signs of   Colon polyps Father    Heart disease Father    Colon polyps Brother    Sleep apnea Nephew    Colon cancer Neg Hx    Esophageal cancer Neg Hx    She indicated that her mother is deceased. She indicated that her father is deceased. She indicated that her brother is alive. She indicated that her maternal grandmother is deceased. She indicated that her maternal grandfather is deceased. She indicated that her paternal grandmother is deceased. She indicated that her paternal grandfather is deceased. She indicated that the status of her neg hx is unknown. She indicated that her nephew is alive.  Social History    Social History   Socioeconomic History   Marital status: Single    Spouse name: Not on file   Number of children: 0   Years of education: 16   Highest education level: Not on file  Occupational History    Occupation: customer service    Employer: [employer]  Tobacco Use   Smoking status: Never   Smokeless tobacco: Never  Vaping Use   Vaping Use: Never used  Substance and Sexual Activity   Alcohol use: No   Drug use: No   Sexual activity: Not on file  Other Topics Concern   Not on file  Social History Narrative   Lives at home with mother.   Caffeine use: Diet and caffeine free soda (8 drinks per day)         Epworth Sleepiness Scale = 7 (as of 01/26/2016)  Social Determinants of Health   Financial Resource Strain: Not on file  Food Insecurity: Not on file  Transportation Needs: Not on file  Physical Activity: Not on file  Stress: Not on file  Social Connections: Not on file  Intimate Partner Violence: Not on file     Review of Systems    General:  No chills, fever, night sweats or weight changes.  Cardiovascular:  No chest pain, chronic dyspnea on exertion, edema, orthopnea, palpitations, paroxysmal nocturnal dyspnea. Dermatological: No rash, lesions/masses Respiratory: No cough, dyspnea Urologic: No hematuria, dysuria Abdominal:   No nausea, vomiting, diarrhea, bright red blood per rectum, melena, or hematemesis.  Abdominal fullness after Tymlos injection Neurologic:  No visual changes, wkns, changes in mental status. All other systems reviewed and are otherwise negative except as noted above.     Physical Exam    VS:  BP 138/68   Pulse 90   Ht 4\' 10"  (1.473 m)   Wt 201 lb 6.4 oz (91.4 kg)   SpO2 97%   BMI 42.09 kg/m  , BMI Body mass index is 42.09 kg/m.     GEN: Well nourished, well developed, in no acute distress.  Central obesity HEENT: normal.  Nystagmus noted Neck: Supple, no JVD, carotid bruits, or masses. Cardiac: RRR, tachycardic, no murmurs, rubs, or gallops. No clubbing, cyanosis, edema.  Radials/DP/PT 2+ and equal bilaterally.  Respiratory:  Respirations regular and unlabored, clear to auscultation bilaterally. GI: Soft, nontender, nondistended,  BS + x 4. MS: no deformity or atrophy. Skin: warm and dry, no rash. Neuro:  Strength and sensation are intact. Psych: Normal affect.  Accessory Clinical Findings    ECG personally reviewed by me today-normal sinus rhythm, heart rate of 90 bpm, questional septal infarct- No acute changes  Lab Results  Component Value Date   WBC 7.5 07/07/2022   HGB 14.0 07/07/2022   HCT 40.9 07/07/2022   MCV 80 07/07/2022   PLT 251 07/07/2022   Lab Results  Component Value Date   CREATININE 0.63 07/07/2022   BUN 13 07/07/2022   NA 138 07/07/2022   K 4.2 07/07/2022   CL 100 07/07/2022   CO2 22 07/07/2022   Lab Results  Component Value Date   ALT 17 07/07/2022   AST 20 07/07/2022   ALKPHOS 116 07/07/2022   BILITOT 0.4 07/07/2022   No results found for: "CHOL", "HDL", "LDLCALC", "LDLDIRECT", "TRIG", "CHOLHDL"  No results found for: "HGBA1C"  Review of Prior Studies:  Echocardiogram 07/20/2022  1. Left ventricular ejection fraction, by estimation, is 60 to 65%. The  left ventricle has normal function. The left ventricle has no regional  wall motion abnormalities. Left ventricular diastolic parameters were  normal.   2. Right ventricular systolic function is normal. The right ventricular  size is normal.   3. The mitral valve is normal in structure. Trivial mitral valve  regurgitation. No evidence of mitral stenosis.   4. The aortic valve has an indeterminant number of cusps. Aortic valve  regurgitation is not visualized. No aortic stenosis is present.   5. The inferior vena cava is normal in size with greater than 50%  respiratory variability, suggesting right atrial pressure of 3 mmHg.   Cardiac CTA 03/22/2021    FINDINGS: Non-cardiac: See separate report from [facility]. No significant findings on limited lung and soft tissue windows.   Calcium score: No calcium noted   Coronary Arteries: Right dominant with no anomalies   LM: Normal   LAD: Normal tortuous  small  distally   IM: Normal   D1: Large branching vessel normal   Circumflex: Normal   OM1: Large vessel normal   RCA: Normal   PDA: Normal   PLA: Normal   IMPRESSION: 1. Calcium score 0   2.  Normal aortic root diameter 3.1 cm   3.  Normal right dominant coronary arteries   [place] Assessment & Plan   1.  Hypertension: Currently well-controlled on medication regimen.  She is followed very frequently by primary care provider Dr. Haynes Kerns with recent labs.  Continue regimen of losartan  2.  Chronic asthma: Followed by pulmonary with inhalers.  Continues to have dyspnea on exertion, fatigue.  May be multi factorial to include obesity.  No evidence of volume overload indicative of CHF.  3.  OSA on CPAP: Reports compliance.  4.  Osteoporosis: Experiencing abdominal fullness epigastric fullness and fullness in her head after injection.  Advised her to follow-up with PCP concerning this medication and possible adjustments or change in medication regimen if this is keeping her from a good quality of life. Current medicines are reviewed at length with the patient today.  I have spent 20 min's  dedicated to the care of this patient on the date of this encounter to include pre-visit review of records, assessment, management and diagnostic testing,with shared decision making. Signed, Phill Myron. West Pugh, ANP, AACC   03/09/2023 2:11 PM      Office [phone] Fax [phone]  Notice: This dictation was prepared with Dragon dictation along with smaller phrase technology. Any transcriptional errors that result from this process are unintentional and may not be corrected upon review.

## 2023-03-09 ENCOUNTER — Encounter: Payer: Self-pay | Admitting: Adult Health

## 2023-03-09 ENCOUNTER — Ambulatory Visit: Payer: Medicare Other | Attending: Adult Health | Admitting: Adult Health

## 2023-03-09 VITALS — BP 138/68 | HR 90 | Ht <= 58 in | Wt 201.4 lb

## 2023-03-09 DIAGNOSIS — G4733 Obstructive sleep apnea (adult) (pediatric): Secondary | ICD-10-CM

## 2023-03-09 DIAGNOSIS — I10 Essential (primary) hypertension: Secondary | ICD-10-CM | POA: Diagnosis not present

## 2023-03-09 DIAGNOSIS — J4520 Mild intermittent asthma, uncomplicated: Secondary | ICD-10-CM

## 2023-03-09 DIAGNOSIS — E119 Type 2 diabetes mellitus without complications: Secondary | ICD-10-CM

## 2023-03-09 DIAGNOSIS — R0602 Shortness of breath: Secondary | ICD-10-CM | POA: Diagnosis not present

## 2023-03-09 DIAGNOSIS — R0609 Other forms of dyspnea: Secondary | ICD-10-CM

## 2023-03-09 NOTE — Patient Instructions (Signed)
Medication Instructions:  No Changes *If you need a refill on your cardiac medications before your next appointment, please call your pharmacy*   Lab Work: No Labs  If you have labs (blood work) drawn today and your tests are completely normal, you will receive your results only by: MyChart Message (if you have MyChart) OR A paper copy in the mail If you have any lab test that is abnormal or we need to change your treatment, we will call you to review the results.   Testing/Procedures: No Testing   Follow-Up: At [facility], you and your health needs are our priority.  As part of our continuing mission to provide you with exceptional heart care, we have created designated Provider Care Teams.  These Care Teams include your primary Cardiologist (physician) and Advanced Practice Providers (APPs -  Physician Assistants and Nurse Practitioners) who all work together to provide you with the care you need, when you need it.  We recommend signing up for the patient portal called "MyChart".  Sign up information is provided on this After Visit Summary.  MyChart is used to connect with patients for Virtual Visits (Telemedicine).  Patients are able to view lab/test results, encounter notes, upcoming appointments, etc.  Non-urgent messages can be sent to your provider as well.   To learn more about what you can do with MyChart, go to [email or web address].    Your next appointment:   1 year(s)  Provider:   Kenneth C Hilty, MD     

## 2023-04-06 ENCOUNTER — Encounter: Payer: Self-pay | Admitting: Pulmonary Disease

## 2023-04-06 ENCOUNTER — Ambulatory Visit: Payer: Medicare Other | Admitting: Pulmonary Disease

## 2023-04-06 ENCOUNTER — Ambulatory Visit (INDEPENDENT_AMBULATORY_CARE_PROVIDER_SITE_OTHER): Payer: Medicare Other

## 2023-04-06 VITALS — BP 130/74 | HR 83 | Wt 211.8 lb

## 2023-04-06 DIAGNOSIS — R0609 Other forms of dyspnea: Secondary | ICD-10-CM | POA: Diagnosis not present

## 2023-04-06 LAB — D-DIMER, QUANTITATIVE: D-Dimer, Quant: 0.53 mcg/mL FEU — ABNORMAL HIGH (ref <0.50)

## 2023-04-06 MED ORDER — PREDNISONE 5 MG PO TABS: ORAL_TABLET | ORAL | 0 refills | Status: AC

## 2023-04-06 NOTE — Patient Instructions (Signed)
I am sorry you are not feeling well  Continue the doxycycline and prednisone as previously prescribed  Once you finish your current prednisone, take the prednisone I prescribed, 10 mg for 5 days then 5 mg for 5 days.  Will get a chest x-ray and blood work today for further evaluation of your symptoms.  Return to clinic in 4 weeks or sooner as needed with Dr. Judeth Horn

## 2023-04-06 NOTE — Progress Notes (Signed)
Adjusted D dimer value is normal - blood clot felt unlikely

## 2023-04-06 NOTE — Progress Notes (Signed)
  ID: Lieutenant Diego, female    DOB: Jul 03, 1951, 72 y.o.   MRN: [id number]  Chief Complaint  Patient presents with   Follow-up    Pt is here for follow up asthma. Pt states she has been sick for a few weeks. And she called her pcp and was placed on  of prednisone, doxy and hydromet cough syrup. Finished prednisone today. Pt states she is still feeling sick.     Referring provider: Rodrigo Ran, MD  HPI:   72 y.o. woman with asthma whom we are seeing in follow up for evaluation of cough, recurrent bronchitis.  Increased cough and shortness of breath.  For the last couple weeks.  Seen by PCP Monday, 4 days ago.  Prednisone and doxycycline.  Helped a little but not time.  Still pretty short of breath.  Cough worse than baseline.  No chest imaging done.  No recent travel or surgeries.  Oxygen saturation 99% on room air.  She is not tachycardic.  Denies any fevers chills etc.  HPI at initial visit: Overall, doing well.  She states she checks her oxygen at home.  Usually 96 to 98%.  In the past when she had pneumonia is never dropped below 88%.  89% at 1 point time in the hospital.  She does describe dyspnea on exertion.  Comes and goes.  Worse on inclines or stairs.  No time of day when things are better or worse.  No seasonal environmental factors she can identify that make things better or worse.  No position make things better or worse.  Sometimes relieved with albuterol.  Use albuterol and Symbicort as needed at this point.  Do think they are a bit beneficial when she is feeling short of breath.  Sensation at times at rest with difficulty getting a full breath and a deep breath in.  She has history of asthma.  [place] seems recurrent bronchitis.  Cough for many weeks.  Was worse when she was working but retired a couple years ago from [employer].  But does tend to have them at least once a year now.  1-2 times yearly prior.  Usually between Thanksgiving and New Year's.  Again, she  does not use maintenance inhaler regularly.  Most recent chest imaging CT coronary scan 03/2021 with clear lungs bilaterally on my review interpretation of available lung fields.  PMH: Asthma, GERD, Surgical history: Carpal tunnel surgery, cataract surgery, cholecystectomy, tonsillectomy Family history: Mother with lung cancer, COPD, CAD, father with CAD Social history: Never smoker, lives in [place] / Pulmonary Flowsheets:   ACT:      No data to display          MMRC:     No data to display          Epworth:      No data to display          Tests:   FENO:  No results found for: "NITRICOXIDE"  PFT:    Latest Ref Rng & Units 08/16/2022    2:59 PM  PFT Results  FVC-Pre L 1.71   FVC-Predicted Pre % 78   FVC-Post L 1.70   FVC-Predicted Post % 77   Pre FEV1/FVC % % 89   Post FEV1/FCV % % 94   FEV1-Pre L 1.52   FEV1-Predicted Pre % 92   FEV1-Post L 1.60   DLCO uncorrected ml/min/mmHg 14.65   DLCO UNC% % 96   DLCO corrected ml/min/mmHg 14.39  DLCO COR %Predicted % 95   DLVA Predicted % 108   TLC L 3.48   TLC % Predicted % 86   RV % Predicted % 53   Personally reviewed interpreted normal spirometry, no bronchodilator response, lung volumes within normal limits with exception of severely reduced ERV, DLCO within normal limits  WALK:      No data to display          Imaging: Personally reviewed and as per EMR discussion in this note No results found.  Lab Results: Personally reviewed CBC    Component Value Date/Time   WBC 7.5 07/07/2022 1205   WBC 10.5 01/18/2022 1419   RBC 5.10 07/07/2022 1205   RBC 5.36 (H) 01/18/2022 1419   HGB 14.0 07/07/2022 1205   HCT 40.9 07/07/2022 1205   PLT 251 07/07/2022 1205   MCV 80 07/07/2022 1205   MCH 27.5 07/07/2022 1205   MCH 27.9 01/10/2017 0514   MCHC 34.2 07/07/2022 1205   MCHC 33.2 01/18/2022 1419   RDW 14.9 07/07/2022 1205   LYMPHSABS 2.5 01/18/2022 1419   MONOABS 0.8  01/18/2022 1419   EOSABS 0.1 01/18/2022 1419   BASOSABS 0.1 01/18/2022 1419    BMET    Component Value Date/Time   NA 138 07/07/2022 1205   K 4.2 07/07/2022 1205   CL 100 07/07/2022 1205   CO2 22 07/07/2022 1205   GLUCOSE 114 (H) 07/07/2022 1205   GLUCOSE 99 01/18/2022 1419   BUN 13 07/07/2022 1205   CREATININE 0.63 07/07/2022 1205   CALCIUM 9.1 07/07/2022 1205   GFRNONAA >60 01/09/2017 1137   GFRAA >60 01/09/2017 1137    BNP No results found for: "BNP"  ProBNP No results found for: "PROBNP"  Specialty Problems       Pulmonary Problems   HIATAL HERNIA    Qualifier: Diagnosis of  By: Dorian Pod, Pam        Chronic cough   Asthma, chronic   Respiratory failure with hypoxia and hypercapnia   OSA on CPAP   Nocturnal hypoxemia   Obesity hypoventilation syndrome    Allergies  Allergen Reactions   Amlodipine Cough   Aspirin     Other Reaction(s): stopped 2018 due to gi  issues but not sure that stopping it made any real difference.   Bydureon [Exenatide] Itching and Other (See Comments)    KNOTS IN SKIN   Lisinopril Cough   Lunesta [Eszopiclone] Other (See Comments)    Metallic taste in mouth   Zoledronic Acid     Other Reaction(s): suspected ONJ issues 7/17   Sulfa Antibiotics Rash    Immunization History  Administered Date(s) Administered   Influenza Whole 09/17/2012   Influenza,inj,quad, With Preservative 11/21/2017, 11/09/2020   Influenza-Unspecified 12/22/2016   Pneumococcal Polysaccharide-23 09/18/2007    Past Medical History:  Diagnosis Date   Adenomatous colon polyp    Anemia    history of   Arthritis    Asthma    Atypical chest pain    Bacterial ear infection 2018   Cancer    melanoma skin cancer 7 years ago   Cataract    Diabetes mellitus without complication    history of taking diabetic medication, has lost weight no longer needing medciation at this time   GERD (gastroesophageal reflux disease)    Glaucoma    Heart murmur     slight   Hypertension    Macular degeneration    Osteoporosis    Sleep apnea  cpap   Ulcerative colitis, chronic     Tobacco History: Social History   Tobacco Use  Smoking Status Never  Smokeless Tobacco Never   Counseling given: Not Answered   Continue to not smoke  Outpatient Encounter Medications as of 04/06/2023  Medication Sig   albuterol (PROVENTIL HFA;VENTOLIN HFA) 108 (90 BASE) MCG/ACT inhaler Inhale 2 puffs into the lungs every 6 (six) hours as needed for wheezing or shortness of breath.   atorvastatin (LIPITOR) 10 MG tablet Take 1 tablet by mouth daily.   budesonide-formoterol (SYMBICORT) 160-4.5 MCG/ACT inhaler Inhale 2 puffs into the lungs daily.   dicyclomine (BENTYL) 10 MG capsule TAKE 1 CAPSULE(10 MG) BY MOUTH FOUR TIMES DAILY BEFORE MEALS AND AT BEDTIME   dorzolamide (TRUSOPT) 2 % ophthalmic solution INSTILL 1 DROP INTO BOTH EYES THREE TIMES DAILY   doxycycline (VIBRA-TABS) 100 MG tablet Take 100 mg by mouth 2 (two) times daily.   esomeprazole (NEXIUM) 40 MG capsule Take 1 capsule (40 mg total) by mouth 2 (two) times daily.   Fluticasone-Umeclidin-Vilant (TRELEGY ELLIPTA) 200-62.5-25 MCG/ACT AEPB Inhale 1 puff into the lungs daily.   furosemide (LASIX) 40 MG tablet Take 40 mg by mouth every evening.    HYDROcodone bit-homatropine (HYCODAN) 5-1.5 MG/5ML syrup 5 mL as needed Orally every 6 hours for cough, caution on sedation for 10 days   levocetirizine (XYZAL) 5 MG tablet Take 5 mg by mouth every evening.   losartan (COZAAR) 25 MG tablet Take 25 mg by mouth daily.   NEOMYCIN-POLYMYXIN-HYDROCORTISONE (CORTISPORIN) 1 % SOLN OTIC solution Apply 1-2 drops to toe BID after soaking   ondansetron (ZOFRAN) 4 MG tablet Take 1 tablet (4 mg total) by mouth every 8 (eight) hours as needed for nausea or vomiting.   predniSONE (DELTASONE) 5 MG tablet Take 2 tablets (10 mg total) by mouth daily with breakfast for 5 days, THEN 1 tablet (5 mg total) daily with breakfast for 5  days.   traMADol (ULTRAM) 50 MG tablet Take 1 tablet (50 mg total) by mouth every 6 (six) hours as needed for moderate pain.   Travoprost, BAK Free, (TRAVATAN) 0.004 % SOLN ophthalmic solution PLACE 1 DROP INTO BOTH EYES NIGHTLY   triamcinolone cream (KENALOG) 0.1 % as needed (rash).   TYMLOS 3120 MCG/1.56ML SOPN Inject into the skin. Every 2 years   Vitamin D, Ergocalciferol, (DRISDOL) 50000 UNITS CAPS Take 50,000 Units by mouth 2 (two) times a week. Sunday & Thursday   Facility-Administered Encounter Medications as of 04/06/2023  Medication   0.9 %  sodium chloride infusion     Review of Systems  Review of Systems  N/a Physical Exam  BP 130/74 (BP Location: Left Arm, Patient Position: Sitting, Cuff Size: Normal)   Pulse 83   Wt 211 lb 12.8 oz (96.1 kg)   SpO2 99%   BMI 44.27 kg/m   Wt Readings from Last 5 Encounters:  04/06/23 211 lb 12.8 oz (96.1 kg)  03/09/23 201 lb 6.4 oz (91.4 kg)  02/07/23 202 lb (91.6 kg)  10/18/22 195 lb (88.5 kg)  08/11/22 207 lb (93.9 kg)    BMI Readings from Last 5 Encounters:  04/06/23 44.27 kg/m  03/09/23 42.09 kg/m  02/07/23 43.71 kg/m  10/18/22 40.76 kg/m  08/11/22 43.26 kg/m     Physical Exam General: Sitting in chair, no acute distress Eyes: EOMI, no icterus Neck: Supple, no JVP appreciated Pulmonary: Clear, normal work of breathing Cardiovascular warm, no edema Abdomen: Nondistended, bowel sounds present MSK: No synovitis,  no joint effusion Neuro: Normal gait, no weakness Psych: Normal mood, full affect   Assessment & Plan:   Dyspnea on exertion: Suspect multifactorial.  Possibly poorly controlled asthma given her recurrent episodes of bronchitis to the year.  Also concern for weight and deconditioning contributing.  Cardiology evaluation in the past has been reassuring.  PFTs within normal limits 07/2019 30.  Asthma: Day-to-day seems fairly well controlled but overall think poorly controlled given bouts of recurrent  bronchitis at least twice a year.  Often in the winter.  Stressed the importance of maintenance inhaler use.  With good adherence to Symbicort twice a day it seems like severity of her recent viral illness was less than usual, did not require prednisone or antibiotics.  Still with relatively severe symptoms when she is ill with what sound like viral illnesses.  Trial of Trelegy not any better. Back on high dose Symbicort.  Subacute cough and worsening dyspnea: On doxycycline and prednisone.  Extend prednisone taper.  Not much better.  Chest x-ray today, D-dimer, consider cross-sectional imaging with or without contrast pending results of D-dimer.   Return in about 4 weeks (around 05/04/2023).   Karren Burly, MD 04/06/2023  I spent 42 minutes in the care of the patient including review of records, face-to-face visit, coordination of care.

## 2023-04-25 ENCOUNTER — Telehealth: Payer: Self-pay | Admitting: Gastroenterology

## 2023-04-25 NOTE — Telephone Encounter (Signed)
Patient calling to make an appointment for abdominal cramping, loss of appetite, and bloating. Made an appointment for 7/12 but is seeking some advice on what to do in the meantime. Please advise

## 2023-04-26 NOTE — Telephone Encounter (Signed)
PT returning call. Please advise. 

## 2023-04-26 NOTE — Telephone Encounter (Signed)
Called the patient. No answer. Left a voicemail of my call.   Patient of Dr Lavon Paganini last seen 03/16/22. She has a history of H. pylori gastritis status post Pylera therapy with eradication in 2017. It was planned that she should return for follow up in June or July of 2023. She was lost to follow up. Medications include Nexium 40 mg BID, FD Guard up to 3 times daily PRN and Bentyl 10 mg QID PRN. Low fat, low carb diet encouraged.

## 2023-04-27 ENCOUNTER — Other Ambulatory Visit: Payer: Self-pay

## 2023-04-27 DIAGNOSIS — Z8719 Personal history of other diseases of the digestive system: Secondary | ICD-10-CM

## 2023-04-27 DIAGNOSIS — R1032 Left lower quadrant pain: Secondary | ICD-10-CM

## 2023-04-27 DIAGNOSIS — R1084 Generalized abdominal pain: Secondary | ICD-10-CM

## 2023-04-27 NOTE — Telephone Encounter (Signed)
Please advise patient to stay on liquid diet, do MiraLAX bowel purge.  She does have history of diverticulosis, please obtain CT abdomen pelvis with contrast to exclude diverticulitis.  Previous bring her in for office visit soon with me or APP if have any cancellations.  Thank you

## 2023-04-27 NOTE — Telephone Encounter (Signed)
Spoke with the patient. Reports that she had LLQ pain, fewer bowel movements and bloating. Stools are formed. The abdominal pain in now more above her waist line and she has a lot of belching and burping. Nothing improves her pain. She does not feel better after a bowel movement, or after eating. Has waves of nausea without vomiting. She confirms she is on Omeprazole and Bentyl. She does not feel Bentyl is relieving her symptoms. Afebrile.  No sooner appointments. Please advise.

## 2023-04-27 NOTE — Telephone Encounter (Signed)
Patient instructed. Agrees to the plan of care. Questions invited and answered. BMP and CT A/P with contrast ordered asap.

## 2023-04-30 ENCOUNTER — Other Ambulatory Visit (INDEPENDENT_AMBULATORY_CARE_PROVIDER_SITE_OTHER): Payer: Medicare Other

## 2023-04-30 DIAGNOSIS — R1084 Generalized abdominal pain: Secondary | ICD-10-CM | POA: Diagnosis not present

## 2023-04-30 DIAGNOSIS — Z8719 Personal history of other diseases of the digestive system: Secondary | ICD-10-CM

## 2023-04-30 DIAGNOSIS — R1032 Left lower quadrant pain: Secondary | ICD-10-CM

## 2023-04-30 LAB — BASIC METABOLIC PANEL
BUN: 7 mg/dL (ref 6–23)
CO2: 25 mEq/L (ref 19–32)
Calcium: 9 mg/dL (ref 8.4–10.5)
Chloride: 98 mEq/L (ref 96–112)
Creatinine, Ser: 0.65 mg/dL (ref 0.40–1.20)
GFR: 88.66 mL/min (ref >60.00)
Glucose, Bld: 114 mg/dL — ABNORMAL HIGH (ref 70–99)
Potassium: 3.5 mEq/L (ref 3.5–5.1)
Sodium: 138 mEq/L (ref 135–145)

## 2023-05-01 ENCOUNTER — Ambulatory Visit (HOSPITAL_COMMUNITY)
Admission: RE | Admit: 2023-05-01 | Discharge: 2023-05-01 | Disposition: A | Discharge status: Home / Self Care | Payer: Medicare Other | Source: Ambulatory Visit | Attending: Gastroenterology | Admitting: Gastroenterology

## 2023-05-01 DIAGNOSIS — R1084 Generalized abdominal pain: Secondary | ICD-10-CM

## 2023-05-01 DIAGNOSIS — R1032 Left lower quadrant pain: Secondary | ICD-10-CM

## 2023-05-01 DIAGNOSIS — Z8719 Personal history of other diseases of the digestive system: Secondary | ICD-10-CM

## 2023-05-01 MED ORDER — IOHEXOL 300 MG/ML  SOLN
100.0000 mL | Freq: Once | INTRAMUSCULAR | Status: AC | PRN
  Administered 2023-05-01: 100 mL via INTRAVENOUS

## 2023-05-01 MED ORDER — IOHEXOL 9 MG/ML PO SOLN
1000.0000 mL | Freq: Once | ORAL | Status: AC
  Administered 2023-05-01: 1000 mL via ORAL

## 2023-05-01 MED ORDER — IOHEXOL 9 MG/ML PO SOLN
ORAL | Status: AC
  Filled 2023-05-01: qty 1000

## 2023-05-02 ENCOUNTER — Other Ambulatory Visit: Payer: Self-pay

## 2023-05-02 MED ORDER — AMOXICILLIN-POT CLAVULANATE 875-125 MG PO TABS: 1.0000 | ORAL_TABLET | Freq: Two times a day (BID) | ORAL | 0 refills | Status: DC

## 2023-05-07 ENCOUNTER — Encounter: Payer: Self-pay | Admitting: Pulmonary Disease

## 2023-05-07 ENCOUNTER — Ambulatory Visit: Payer: Medicare Other | Admitting: Pulmonary Disease

## 2023-05-07 VITALS — BP 124/76 | HR 74 | Temp 98.3°F | Ht <= 58 in | Wt 196.2 lb

## 2023-05-07 DIAGNOSIS — G4733 Obstructive sleep apnea (adult) (pediatric): Secondary | ICD-10-CM

## 2023-05-07 DIAGNOSIS — J4540 Moderate persistent asthma, uncomplicated: Secondary | ICD-10-CM | POA: Diagnosis not present

## 2023-05-07 NOTE — Patient Instructions (Addendum)
Nice to see you again  Continue the Symbicort 2 puff twice a day  No changes to medicines   Return to clinic in 6 months or sooner as needed

## 2023-05-07 NOTE — Progress Notes (Signed)
@Patient  ID: Shawna Gonzalez, female    DOB: 18-Sep-1951, 72 y.o.   MRN: [id number]  Chief Complaint  Patient presents with   Follow-up    DOE improved    Referring provider: Rodrigo Ran, MD  HPI:   72 y.o. woman with asthma whom we are seeing in follow up for evaluation of cough, recurrent bronchitis.  At last visit, had some prolonged bronchitis symptoms.  Extended prednisone course.  This helped.  Breathing better.  Dyspnea better.  Reports good adherence to Symbicort 2 puff twice daily.  Tried Trelegy in the past, no better.  Had a lot of trouble with her stomach.  Pain etc. with eating.  Had a CT scan interim on my review reveals signs of diverticulitis.  She has been in touch with her GI doctor.  But now symptoms ongoing for 3 weeks despite conservative management.  Encouraged her to contact GI for further evaluation.  HPI at initial visit: Overall, doing well.  She states she checks her oxygen at home.  Usually 96 to 98%.  In the past when she had pneumonia is never dropped below 88%.  89% at 1 point time in the hospital.  She does describe dyspnea on exertion.  Comes and goes.  Worse on inclines or stairs.  No time of day when things are better or worse.  No seasonal environmental factors she can identify that make things better or worse.  No position make things better or worse.  Sometimes relieved with albuterol.  Use albuterol and Symbicort as needed at this point.  Do think they are a bit beneficial when she is feeling short of breath.  Sensation at times at rest with difficulty getting a full breath and a deep breath in.  She has history of asthma.  [place] seems recurrent bronchitis.  Cough for many weeks.  Was worse when she was working but retired a couple years ago from [employer].  But does tend to have them at least once a year now.  1-2 times yearly prior.  Usually between Thanksgiving and New Year's.  Again, she does not use maintenance inhaler regularly.  Most recent  chest imaging CT coronary scan 03/2021 with clear lungs bilaterally on my review interpretation of available lung fields.  PMH: Asthma, GERD, Surgical history: Carpal tunnel surgery, cataract surgery, cholecystectomy, tonsillectomy Family history: Mother with lung cancer, COPD, CAD, father with CAD Social history: Never smoker, lives in [place] / Pulmonary Flowsheets:   ACT:      No data to display           MMRC:     No data to display           Epworth:      No data to display           Tests:   FENO:  No results found for: "NITRICOXIDE"  PFT:    Latest Ref Rng & Units 08/16/2022    2:59 PM  PFT Results  FVC-Pre L 1.71   FVC-Predicted Pre % 78   FVC-Post L 1.70   FVC-Predicted Post % 77   Pre FEV1/FVC % % 89   Post FEV1/FCV % % 94   FEV1-Pre L 1.52   FEV1-Predicted Pre % 92   FEV1-Post L 1.60   DLCO uncorrected ml/min/mmHg 14.65   DLCO UNC% % 96   DLCO corrected ml/min/mmHg 14.39   DLCO COR %Predicted % 95   DLVA Predicted % 108   TLC  L 3.48   TLC % Predicted % 86   RV % Predicted % 53   Personally reviewed interpreted normal spirometry, no bronchodilator response, lung volumes within normal limits with exception of severely reduced ERV, DLCO within normal limits  WALK:      No data to display           Imaging: Personally reviewed and as per EMR discussion in this note CT Abdomen Pelvis W Contrast  Result Date: 05/01/2023 CLINICAL DATA:  Left lower quadrant pain.  Previous diverticulitis. EXAM: CT ABDOMEN AND PELVIS WITH CONTRAST TECHNIQUE: Multidetector CT imaging of the abdomen and pelvis was performed using the standard protocol following bolus administration of intravenous contrast. RADIATION DOSE REDUCTION: This exam was performed according to the departmental dose-optimization program which includes automated exposure control, adjustment of the mA and/or kV according to patient size and/or use of iterative  reconstruction technique. CONTRAST:  OMNIPAQUE IOHEXOL 300 MG/ML  SOLN COMPARISON:  09/13/2021 FINDINGS: Lower Chest: No acute findings. Hepatobiliary: No hepatic masses identified. Mild-to-moderate diffuse hepatic steatosis again noted. Prior cholecystectomy. No evidence of biliary obstruction. Pancreas:  No mass or inflammatory changes. Spleen: Within normal limits in size and appearance. Adrenals/Urinary Tract: No suspicious masses identified. No evidence of ureteral calculi or hydronephrosis. Stomach/Bowel: No evidence of obstruction, inflammatory process or abnormal fluid collections. Diffuse colonic diverticulosis is seen, with mild diverticulitis involving the descending and proximal sigmoid colon. No evidence of perforation or abscess. Vascular/Lymphatic: No pathologically enlarged lymph nodes. No acute vascular findings. Reproductive: Prior hysterectomy noted. Adnexal regions are unremarkable in appearance. Other:  None. Musculoskeletal:  No suspicious bone lesions identified. IMPRESSION: Mild diverticulitis involving the descending and proximal sigmoid colon. No evidence of perforation or abscess. Hepatic steatosis. Electronically Signed   By: Danae Orleans M.D.   On: 05/01/2023 21:43    Lab Results: Personally reviewed CBC    Component Value Date/Time   WBC 7.5 07/07/2022 1205   WBC 10.5 01/18/2022 1419   RBC 5.10 07/07/2022 1205   RBC 5.36 (H) 01/18/2022 1419   HGB 14.0 07/07/2022 1205   HCT 40.9 07/07/2022 1205   PLT 251 07/07/2022 1205   MCV 80 07/07/2022 1205   MCH 27.5 07/07/2022 1205   MCH 27.9 01/10/2017 0514   MCHC 34.2 07/07/2022 1205   MCHC 33.2 01/18/2022 1419   RDW 14.9 07/07/2022 1205   LYMPHSABS 2.5 01/18/2022 1419   MONOABS 0.8 01/18/2022 1419   EOSABS 0.1 01/18/2022 1419   BASOSABS 0.1 01/18/2022 1419    BMET    Component Value Date/Time   NA 138 04/30/2023 1023   NA 138 07/07/2022 1205   K 3.5 04/30/2023 1023   CL 98 04/30/2023 1023   CO2 25  04/30/2023 1023   GLUCOSE 114 (H) 04/30/2023 1023   BUN 7 04/30/2023 1023   BUN 13 07/07/2022 1205   CREATININE 0.65 04/30/2023 1023   CALCIUM 9.0 04/30/2023 1023   GFRNONAA >60 01/09/2017 1137   GFRAA >60 01/09/2017 1137    BNP No results found for: "BNP"  ProBNP No results found for: "PROBNP"  Specialty Problems       Pulmonary Problems   HIATAL HERNIA    Qualifier: Diagnosis of  By: Dorian Pod, Pam        Chronic cough   Asthma, chronic   Respiratory failure with hypoxia and hypercapnia (HCC)   OSA on CPAP   Nocturnal hypoxemia   Obesity hypoventilation syndrome (HCC)    Allergies  Allergen Reactions  Amlodipine Cough   Aspirin     Other Reaction(s): stopped 2018 due to gi  issues but not sure that stopping it made any real difference.   Bydureon [Exenatide] Itching and Other (See Comments)    KNOTS IN SKIN   Lisinopril Cough   Lunesta [Eszopiclone] Other (See Comments)    Metallic taste in mouth   Zoledronic Acid     Other Reaction(s): suspected ONJ issues 7/17   Sulfa Antibiotics Rash    Immunization History  Administered Date(s) Administered   Influenza Whole 09/17/2012   Influenza,inj,quad, With Preservative 11/21/2017, 11/09/2020   Influenza-Unspecified 12/22/2016   Pneumococcal Polysaccharide-23 09/18/2007    Past Medical History:  Diagnosis Date   Adenomatous colon polyp    Anemia    history of   Arthritis    Asthma    Atypical chest pain    Bacterial ear infection 2018   Cancer (HCC)    melanoma skin cancer 7 years ago   Cataract    Diabetes mellitus without complication (HCC)    history of taking diabetic medication, has lost weight no longer needing medciation at this time   GERD (gastroesophageal reflux disease)    Glaucoma    Heart murmur    slight   Hypertension    Macular degeneration    Osteoporosis    Sleep apnea    cpap   Ulcerative colitis, chronic (HCC)     Tobacco History: Social History   Tobacco Use   Smoking Status Never  Smokeless Tobacco Never   Counseling given: Not Answered   Continue to not smoke  Outpatient Encounter Medications as of 05/07/2023  Medication Sig   albuterol (PROVENTIL HFA;VENTOLIN HFA) 108 (90 BASE) MCG/ACT inhaler Inhale 2 puffs into the lungs every 6 (six) hours as needed for wheezing or shortness of breath.   amoxicillin-clavulanate (AUGMENTIN) 875-125 MG tablet Take 1 tablet by mouth 2 (two) times daily.   atorvastatin (LIPITOR) 10 MG tablet Take 1 tablet by mouth daily.   budesonide-formoterol (SYMBICORT) 160-4.5 MCG/ACT inhaler Inhale 2 puffs into the lungs daily.   dicyclomine (BENTYL) 10 MG capsule TAKE 1 CAPSULE(10 MG) BY MOUTH FOUR TIMES DAILY BEFORE MEALS AND AT BEDTIME   dorzolamide (TRUSOPT) 2 % ophthalmic solution INSTILL 1 DROP INTO BOTH EYES THREE TIMES DAILY   doxycycline (VIBRA-TABS) 100 MG tablet Take 100 mg by mouth 2 (two) times daily.   esomeprazole (NEXIUM) 40 MG capsule Take 1 capsule (40 mg total) by mouth 2 (two) times daily.   furosemide (LASIX) 40 MG tablet Take 40 mg by mouth every evening.    HYDROcodone bit-homatropine (HYCODAN) 5-1.5 MG/5ML syrup 5 mL as needed Orally every 6 hours for cough, caution on sedation for 10 days   levocetirizine (XYZAL) 5 MG tablet Take 5 mg by mouth every evening.   losartan (COZAAR) 25 MG tablet Take 25 mg by mouth daily.   NEOMYCIN-POLYMYXIN-HYDROCORTISONE (CORTISPORIN) 1 % SOLN OTIC solution Apply 1-2 drops to toe BID after soaking   ondansetron (ZOFRAN) 4 MG tablet Take 1 tablet (4 mg total) by mouth every 8 (eight) hours as needed for nausea or vomiting.   traMADol (ULTRAM) 50 MG tablet Take 1 tablet (50 mg total) by mouth every 6 (six) hours as needed for moderate pain.   Travoprost, BAK Free, (TRAVATAN) 0.004 % SOLN ophthalmic solution PLACE 1 DROP INTO BOTH EYES NIGHTLY   triamcinolone cream (KENALOG) 0.1 % as needed (rash).   TYMLOS 3120 MCG/1.56ML SOPN Inject into the skin. Every  2 years    Vitamin D, Ergocalciferol, (DRISDOL) 50000 UNITS CAPS Take 50,000 Units by mouth 2 (two) times a week. Sunday & Thursday   [DISCONTINUED] Fluticasone-Umeclidin-Vilant (TRELEGY ELLIPTA) 200-62.5-25 MCG/ACT AEPB Inhale 1 puff into the lungs daily. (Patient not taking: Reported on 05/07/2023)   Facility-Administered Encounter Medications as of 05/07/2023  Medication   0.9 %  sodium chloride infusion     Review of Systems  Review of Systems  N/a Physical Exam  BP 124/76 (BP Location: Left Arm, Patient Position: Sitting, Cuff Size: Normal)   Pulse 74   Temp 98.3 F (36.8 C) (Oral)   Ht 4\' 10"  (1.473 m)   Wt 196 lb 3.2 oz (89 kg)   SpO2 99%   BMI 41.01 kg/m   Wt Readings from Last 5 Encounters:  05/07/23 196 lb 3.2 oz (89 kg)  04/06/23 211 lb 12.8 oz (96.1 kg)  03/09/23 201 lb 6.4 oz (91.4 kg)  02/07/23 202 lb (91.6 kg)  10/18/22 195 lb (88.5 kg)    BMI Readings from Last 5 Encounters:  05/07/23 41.01 kg/m  04/06/23 44.27 kg/m  03/09/23 42.09 kg/m  02/07/23 43.71 kg/m  10/18/22 40.76 kg/m     Physical Exam General: Sitting in chair, no acute distress Eyes: EOMI, no icterus Neck: Supple, no JVP appreciated Pulmonary: Clear, normal work of breathing Cardiovascular warm, no edema Abdomen: Nondistended, bowel sounds present MSK: No synovitis, no joint effusion Neuro: Normal gait, no weakness Psych: Normal mood, full affect   Assessment & Plan:   Dyspnea on exertion: Suspect multifactorial.  Possibly poorly controlled asthma given her recurrent episodes of bronchitis to the year.  Also concern for weight and deconditioning contributing.  Cardiology evaluation in the past has been reassuring.  PFTs within normal limits 07/2022.  Asthma: Day-to-day seems fairly well controlled but overall think poorly controlled given bouts of recurrent bronchitis at least twice a year.  Often in the winter.  Stressed the importance of maintenance inhaler use.  With good adherence to  Symbicort twice a day it seems like severity of her recent viral illness was less than usual, did not require prednisone or antibiotics.  Still with relatively severe symptoms when she is ill with what sound like viral illnesses.  Trial of Trelegy not any better. Back on high dose Symbicort.  Breathing seems back to baseline after exacerbation/2024.    Return in about 6 months (around 11/07/2023).   Karren Burly, MD 05/07/2023

## 2023-06-28 NOTE — Progress Notes (Signed)
06/29/2023 Shawna Gonzalez [id number] 01-May-1951  Referring provider: Rodrigo Ran, MD Primary GI doctor: Dr. Lavon Paganini  ASSESSMENT AND PLAN:   Abdominal pain, LLQ with associated diarrhea, nausea Patient had diverticulitis in May, treated with Augmentin, symptoms improved for 2 weeks but then returned Patient has been on multiple antibiotics Patient had some abdominal discomfort on exam, guarding but no rebound Some concern for diverticulitis complication, will get stat CT, labs, ER precautions discussed with patient Will get C. difficile with diarrhea and multiple antibiotics If CT is unremarkable and C. difficile is negative we will plan for repeat colonoscopy at the [facility] with Dr. Lavon Paganini as patient is overdue. If CT and colonoscopy are negative will consider SIBO treatment  GERD, nausea, abdominal bloating, history of H. Pylori EGD a year and a half ago Hill grade 2, normal esophagus, gastritis negative H. Pylori More epigastric abdominal pain, increase Nexium to twice daily FODMAP diet given Possibly postinfectious from diverticulitis versus SIBO/floor disruption for multiple antibiotics Will plan on endoscopic evaluation with colonoscopy, if this is negative can consider SIBO treatment  Morbid obesity (HCC) with fatty liver -  FIB4 is 1.37 from last year LFTs, no fibrosis - need LFTs and CBC monitored every 6 months, revaluation every 2-3 years.  --Continue to work on risk factor modification including diet exercise and control of risk factors including blood sugars.     Patient Care Team: Rodrigo Ran, MD as PCP - General (Internal Medicine) Rennis Golden Lisette Abu, MD as Consulting Physician (Cardiology)  HISTORY OF PRESENT ILLNESS: 72 y.o. female with a past medical history of ulcerative colitis, sleep apnea on CPAP, not on oxygen, history of H. pylori gastritis with eradication 2017, chronic GERD, type 2 diabetes, melanoma of skin, personal history of adenomatous  polyps and others listed below presents for evaluation of cramping, bloating, abdominal pain loss of appetite.   05/24/2020 colonoscopy 3 polyps 5 to 9 mm size transverse descending colon, ascending colon, 1 polyp rectum less than 1 mm, suspicious of diverticulitis, Recall 05/2023 02/08/2022 EGD gastroesophageal flap Hill grade 2, normal esophagus, gastric erosions no bleeding negative H. pylori, gastritis normal duodenum 05/01/2023 CT abdomen pelvis left lower quadrant abdominal pain mild diverticulitis descending proximal sigmoid colon no perforation or abscess, hepatic steatosis  She states with the ABX her symptoms went away for about a month but then her symptoms have started again for 2-3 months.  She is having AB pain all the time except it is not waking her up.  She is having AB bloating and nausea, worse after eating.  She was having decreased appetitie, having weight loss. She has increase burping/gas.  Denies fever, chills but did have chills last week with sinuses and cough, given zpak and prednisone with PCP. She has chronic sinusitis/bronchitis and will have ABX and prednisone at least 3 times a year.  She is is on dicyclomine and nexium without help. Nothing is helping.  She is having diarrhea every day,   She denies blood thinner use.  She denies NSAID use.  She denies ETOH use.   She denies tobacco use.  She denies drug use.    She  reports that she has never smoked. She has never used smokeless tobacco. She reports that she does not drink alcohol and does not use drugs.  RELEVANT LABS AND IMAGING: CBC    Component Value Date/Time   WBC 7.5 07/07/2022 1205   WBC 10.5 01/18/2022 1419   RBC 5.10 07/07/2022 1205   RBC  5.36 (H) 01/18/2022 1419   HGB 14.0 07/07/2022 1205   HCT 40.9 07/07/2022 1205   PLT 251 07/07/2022 1205   MCV 80 07/07/2022 1205   MCH 27.5 07/07/2022 1205   MCH 27.9 01/10/2017 0514   MCHC 34.2 07/07/2022 1205   MCHC 33.2 01/18/2022 1419   RDW 14.9  07/07/2022 1205   LYMPHSABS 2.5 01/18/2022 1419   MONOABS 0.8 01/18/2022 1419   EOSABS 0.1 01/18/2022 1419   BASOSABS 0.1 01/18/2022 1419   Recent Labs    07/07/22 1205  HGB 14.0    CMP     Component Value Date/Time   NA 138 04/30/2023 1023   NA 138 07/07/2022 1205   K 3.5 04/30/2023 1023   CL 98 04/30/2023 1023   CO2 25 04/30/2023 1023   GLUCOSE 114 (H) 04/30/2023 1023   BUN 7 04/30/2023 1023   BUN 13 07/07/2022 1205   CREATININE 0.65 04/30/2023 1023   CALCIUM 9.0 04/30/2023 1023   PROT 6.9 07/07/2022 1205   ALBUMIN 4.2 07/07/2022 1205   AST 20 07/07/2022 1205   ALT 17 07/07/2022 1205   ALKPHOS 116 07/07/2022 1205   BILITOT 0.4 07/07/2022 1205   GFRNONAA >60 01/09/2017 1137   GFRAA >60 01/09/2017 1137      Latest Ref Rng & Units 07/07/2022   12:05 PM 01/18/2022    2:19 PM 04/13/2020    2:33 PM  Hepatic Function  Total Protein 6.0 - 8.5 g/dL 6.9  8.2  7.4   Albumin 3.9 - 4.9 g/dL 4.2  4.4  4.0   AST 0 - 40 IU/L 20  15  16    ALT 0 - 32 IU/L 17  15  19    Alk Phosphatase 44 - 121 IU/L 116  114  96   Total Bilirubin 0.0 - 1.2 mg/dL 0.4  0.8  0.5       Current Medications:   Current Outpatient Medications (Endocrine & Metabolic):    TYMLOS 3120 MCG/1.56ML SOPN, Inject into the skin. Every 2 years   Current Outpatient Medications (Cardiovascular):    atorvastatin (LIPITOR) 10 MG tablet, Take 1 tablet by mouth daily.   furosemide (LASIX) 40 MG tablet, Take 40 mg by mouth every evening.    losartan (COZAAR) 25 MG tablet, Take 25 mg by mouth daily.   Current Outpatient Medications (Respiratory):    albuterol (PROVENTIL HFA;VENTOLIN HFA) 108 (90 BASE) MCG/ACT inhaler, Inhale 2 puffs into the lungs every 6 (six) hours as needed for wheezing or shortness of breath.   budesonide-formoterol (SYMBICORT) 160-4.5 MCG/ACT inhaler, Inhale 2 puffs into the lungs daily.   HYDROcodone bit-homatropine (HYCODAN) 5-1.5 MG/5ML syrup, 5 mL as needed Orally every 6 hours for cough,  caution on sedation for 10 days   levocetirizine (XYZAL) 5 MG tablet, Take 5 mg by mouth every evening.   Current Outpatient Medications (Analgesics):    traMADol (ULTRAM) 50 MG tablet, Take 1 tablet (50 mg total) by mouth every 6 (six) hours as needed for moderate pain.     Current Outpatient Medications (Other):    dicyclomine (BENTYL) 10 MG capsule, TAKE 1 CAPSULE(10 MG) BY MOUTH FOUR TIMES DAILY BEFORE MEALS AND AT BEDTIME   dorzolamide (TRUSOPT) 2 % ophthalmic solution, INSTILL 1 DROP INTO BOTH EYES THREE TIMES DAILY   doxycycline (VIBRA-TABS) 100 MG tablet, Take 100 mg by mouth 2 (two) times daily.   esomeprazole (NEXIUM) 40 MG capsule, Take 1 capsule (40 mg total) by mouth 2 (two) times daily.  NEOMYCIN-POLYMYXIN-HYDROCORTISONE (CORTISPORIN) 1 % SOLN OTIC solution, Apply 1-2 drops to toe BID after soaking   Travoprost, BAK Free, (TRAVATAN) 0.004 % SOLN ophthalmic solution, PLACE 1 DROP INTO BOTH EYES NIGHTLY   triamcinolone cream (KENALOG) 0.1 %, as needed (rash).   Vitamin D, Ergocalciferol, (DRISDOL) 50000 UNITS CAPS, Take 50,000 Units by mouth 2 (two) times a week. Sunday & Thursday  Current Facility-Administered Medications (Other):    0.9 %  sodium chloride infusion  Medical History:  Past Medical History:  Diagnosis Date   Adenomatous colon polyp    Anemia    history of   Arthritis    Asthma    Atypical chest pain    Bacterial ear infection 2018   Cancer (HCC)    melanoma skin cancer 7 years ago   Cataract    Diabetes mellitus without complication (HCC)    history of taking diabetic medication, has lost weight no longer needing medciation at this time   GERD (gastroesophageal reflux disease)    Glaucoma    Heart murmur    slight   Hypertension    Macular degeneration    Osteoporosis    Sleep apnea    cpap   Ulcerative colitis, chronic (HCC)    Allergies:  Allergies  Allergen Reactions   Amlodipine Cough   Aspirin     Other Reaction(s): stopped 2018  due to gi  issues but not sure that stopping it made any real difference.   Bydureon [Exenatide] Itching and Other (See Comments)    KNOTS IN SKIN   Lisinopril Cough   Lunesta [Eszopiclone] Other (See Comments)    Metallic taste in mouth   Zoledronic Acid     Other Reaction(s): suspected ONJ issues 7/17   Sulfa Antibiotics Rash     Surgical History:  She  has a past surgical history that includes Cataract extraction w/ intraocular lens  implant, bilateral (Bilateral, 05/21/2007(Right); 06/04/2007(Left)); De Quervain's release (Right, 11/30/2005); Carpal tunnel release (Right, 12/16/2009); Cholecystectomy (2000); Tonsillectomy; Upper gi endoscopy (03/05/2017); Hysterectomy abdominal with salpingectomy (Bilateral, 01/09/2017); Colonoscopy with propofol (01/23/2012); and Trigger finger release (Right). Family History:  Her family history includes COPD in her mother; Colon polyps in her brother, father, and mother; Heart disease in her father and mother; Sleep apnea in her nephew.  REVIEW OF SYSTEMS  : All other systems reviewed and negative except where noted in the History of Present Illness.  PHYSICAL EXAM: BP 128/70 (BP Location: Left Arm, Patient Position: Sitting, Cuff Size: Normal)   Pulse 76   Ht 4\' 9"  (1.448 m)   Wt 190 lb 6 oz (86.4 kg)   BMI 41.20 kg/m  General Appearance: Well nourished, in no apparent distress. Head:   Normocephalic and atraumatic. Eyes:  sclerae anicteric,conjunctive pink  Respiratory: Respiratory effort normal, BS equal bilaterally without rales, rhonchi, wheezing. Cardio: RRR with no MRGs. Peripheral pulses intact.  Abdomen: Soft,  Obese ,active bowel sounds. moderate tenderness in the periumbilical area. With guarding and Without rebound. No masses. Rectal: Not evaluated Musculoskeletal: Full ROM, Normal gait. Without edema. Skin:  Dry and intact without significant lesions or rashes Neuro: Alert and  oriented x4;  No focal deficits. Psych:  Cooperative.  Normal mood and affect.    Doree Albee, PA-C 3:55 PM

## 2023-06-29 ENCOUNTER — Encounter: Payer: Self-pay | Admitting: Physician Assistant

## 2023-06-29 ENCOUNTER — Other Ambulatory Visit: Payer: Medicare Other

## 2023-06-29 ENCOUNTER — Ambulatory Visit: Payer: Medicare Other | Admitting: Physician Assistant

## 2023-06-29 ENCOUNTER — Other Ambulatory Visit (INDEPENDENT_AMBULATORY_CARE_PROVIDER_SITE_OTHER): Payer: Medicare Other

## 2023-06-29 VITALS — BP 128/70 | HR 76 | Ht <= 58 in | Wt 190.4 lb

## 2023-06-29 DIAGNOSIS — R110 Nausea: Secondary | ICD-10-CM | POA: Diagnosis not present

## 2023-06-29 DIAGNOSIS — A09 Infectious gastroenteritis and colitis, unspecified: Secondary | ICD-10-CM

## 2023-06-29 DIAGNOSIS — R1032 Left lower quadrant pain: Secondary | ICD-10-CM

## 2023-06-29 DIAGNOSIS — Z8619 Personal history of other infectious and parasitic diseases: Secondary | ICD-10-CM | POA: Diagnosis not present

## 2023-06-29 DIAGNOSIS — K760 Fatty (change of) liver, not elsewhere classified: Secondary | ICD-10-CM

## 2023-06-29 LAB — CBC WITH DIFFERENTIAL/PLATELET
Basophils Absolute: 0.1 10*3/uL (ref 0.0–0.1)
Basophils Relative: 0.7 % (ref 0.0–3.0)
Eosinophils Absolute: 0.2 10*3/uL (ref 0.0–0.7)
Eosinophils Relative: 2 % (ref 0.0–5.0)
HCT: 44.6 % (ref 36.0–46.0)
Hemoglobin: 14.9 g/dL (ref 12.0–15.0)
Lymphocytes Relative: 27.8 % (ref 12.0–46.0)
Lymphs Abs: 2.8 10*3/uL (ref 0.7–4.0)
MCHC: 33.5 g/dL (ref 30.0–36.0)
MCV: 83.7 fl (ref 78.0–100.0)
Monocytes Absolute: 0.6 10*3/uL (ref 0.1–1.0)
Monocytes Relative: 5.6 % (ref 3.0–12.0)
Neutro Abs: 6.4 10*3/uL (ref 1.4–7.7)
Neutrophils Relative %: 63.9 % (ref 43.0–77.0)
Platelets: 225 10*3/uL (ref 150.0–400.0)
RBC: 5.33 Mil/uL — ABNORMAL HIGH (ref 3.87–5.11)
RDW: 15.9 % — ABNORMAL HIGH (ref 11.5–15.5)
WBC: 10 10*3/uL (ref 4.0–10.5)

## 2023-06-29 LAB — COMPREHENSIVE METABOLIC PANEL
ALT: 25 U/L (ref 0–35)
AST: 18 U/L (ref 0–37)
Albumin: 4.1 g/dL (ref 3.5–5.2)
Alkaline Phosphatase: 132 U/L — ABNORMAL HIGH (ref 39–117)
BUN: 11 mg/dL (ref 6–23)
CO2: 29 mEq/L (ref 19–32)
Calcium: 9.8 mg/dL (ref 8.4–10.5)
Chloride: 98 mEq/L (ref 96–112)
Creatinine, Ser: 0.69 mg/dL (ref 0.40–1.20)
GFR: 87.29 mL/min (ref >60.00)
Glucose, Bld: 103 mg/dL — ABNORMAL HIGH (ref 70–99)
Potassium: 3.7 mEq/L (ref 3.5–5.1)
Sodium: 137 mEq/L (ref 135–145)
Total Bilirubin: 0.8 mg/dL (ref 0.2–1.2)
Total Protein: 7.4 g/dL (ref 6.0–8.3)

## 2023-06-29 LAB — TSH: TSH: 2.46 u[IU]/mL (ref 0.35–5.50)

## 2023-06-29 LAB — SEDIMENTATION RATE: Sed Rate: 35 mm/hr — ABNORMAL HIGH (ref 0–30)

## 2023-06-29 NOTE — Patient Instructions (Addendum)
Your provider has requested that you go to the basement level for lab work before leaving today. Press "B" on the elevator. The lab is located at the first door on the left as you exit the elevator.  You have been scheduled for a CT scan of the abdomen and pelvis at [facility]. You are scheduled on 06/30/2023  at 3:30pm. You should arrive 15 minutes prior to your appointment time for registration.Please follow the written instructions below on the day of your exam:   1) Do not eat anything after 4 hours prior to your test   You may take any medications as prescribed with a small amount of water, if necessary. If you take any of the following medications: METFORMIN, GLUCOPHAGE, GLUCOVANCE, AVANDAMET, RIOMET, FORTAMET, ACTOPLUS MET, JANUMET, GLUMETZA or METAGLIP, you MAY be asked to HOLD this medication 48 hours AFTER the exam.   The purpose of you drinking the oral contrast is to aid in the visualization of your intestinal tract. The contrast solution may cause some diarrhea. Depending on your individual set of symptoms, you may also receive an intravenous injection of x-ray contrast/dye. Plan on being at [facility] for 45 minutes or longer, depending on the type of exam you are having performed.   If you have any questions regarding your exam or if you need to reschedule, you may call [facility] at [phone] between the hours of 8:00 am and 5:00 pm, Monday-Friday.    You may have POST INFECTIOUS IBS OR IRRITABLE BOWEL After an infection or diverticulitis flare your intestines can spasm or be a little bit more sensitive. Try these things below:  Can do BRAT diet versus low FODMAP- see below Try trial off milk/lactose products.  Add fiber like benefiber or citracel once a day Can do trial of IBGard for AB pain EVERY DAY- Take 1-2 capsules once a day for maintence or twice a day during a flare Can take dicyclomine as needed.  if any worsening symptoms like blood in stool,  weight loss, please call the office or go to the ER.      FODMAP stands for fermentable oligo-, di-, mono-saccharides and polyols (1). These are the scientific terms used to classify groups of carbs that are difficult for our body to digest and that are notorious for triggering digestive symptoms like bloating, gas, loose stools and stomach pain.   You can try low FODMAP diet  - start with eliminating just one column at a time that you feel may be a trigger for you. - the table at the very bottom contains foods that are low in FODMAPs   Sometimes trying to eliminate the FODMAP's from your diet is difficult or tricky, if you are stuggling with trying to do the elimination diet you can try an enzyme.  There is a food enzymes that you sprinkle in or on your food that helps break down the FODMAP. You can read more about the enzyme by going to this site: https://fodzyme.com/     We may want to evaluate you for small intestinal bacterial overgrowth, this can cause increase gas, bloating, loose stools or constipation.  There is a test for this we can do or sometimes we will treat a patient with an antibiotic to see if it helps.   _______________________________________________________  If your blood pressure at your visit was 140/90 or greater, please contact your primary care physician to follow up on this.  _______________________________________________________  If you are age 7 or  older, your body mass index should be between 23-30. Your Body mass index is 41.2 kg/m. If this is out of the aforementioned range listed, please consider follow up with your Primary Care Provider.  If you are age 7 or younger, your body mass index should be between 19-25. Your Body mass index is 41.2 kg/m. If this is out of the aformentioned range listed, please consider follow up with your Primary Care Provider.   ________________________________________________________  The [facility] providers would  like to encourage you to use [facility] to communicate with providers for non-urgent requests or questions.  Due to long hold times on the telephone, sending your provider a message by [facility] may be a faster and more efficient way to get a response.  Please allow 48 business hours for a response.  Please remember that this is for non-urgent requests.  _______________________________________________________ It was a pleasure to see you today!  Thank you for trusting me with your gastrointestinal care!

## 2023-06-30 ENCOUNTER — Encounter (HOSPITAL_BASED_OUTPATIENT_CLINIC_OR_DEPARTMENT_OTHER): Payer: Self-pay

## 2023-06-30 ENCOUNTER — Ambulatory Visit (HOSPITAL_BASED_OUTPATIENT_CLINIC_OR_DEPARTMENT_OTHER)
Admission: RE | Admit: 2023-06-30 | Discharge: 2023-06-30 | Disposition: A | Discharge status: Home / Self Care | Payer: Medicare Other | Source: Ambulatory Visit | Attending: Physician Assistant | Admitting: Physician Assistant

## 2023-06-30 DIAGNOSIS — R1032 Left lower quadrant pain: Secondary | ICD-10-CM | POA: Insufficient documentation

## 2023-06-30 DIAGNOSIS — R110 Nausea: Secondary | ICD-10-CM | POA: Diagnosis present

## 2023-06-30 DIAGNOSIS — A09 Infectious gastroenteritis and colitis, unspecified: Secondary | ICD-10-CM | POA: Insufficient documentation

## 2023-06-30 MED ORDER — IOHEXOL 300 MG/ML  SOLN
100.0000 mL | Freq: Once | INTRAMUSCULAR | Status: AC | PRN
  Administered 2023-06-30: 100 mL via INTRAVENOUS

## 2023-07-02 ENCOUNTER — Telehealth: Payer: Self-pay | Admitting: Physician Assistant

## 2023-07-02 DIAGNOSIS — R1032 Left lower quadrant pain: Secondary | ICD-10-CM

## 2023-07-02 MED ORDER — METRONIDAZOLE 500 MG PO TABS: 500.0000 mg | ORAL_TABLET | Freq: Three times a day (TID) | ORAL | 0 refills | Status: AC

## 2023-07-02 MED ORDER — CIPROFLOXACIN HCL 500 MG PO TABS
500.0000 mg | ORAL_TABLET | Freq: Two times a day (BID) | ORAL | 0 refills | Status: DC
Start: 2023-07-02 — End: 2023-08-15

## 2023-07-02 NOTE — Telephone Encounter (Signed)
Pt scheduled to see Quentin Mulling PA 08/15/23 at 1:30pm. Pt mailed appt letter.

## 2023-07-02 NOTE — Telephone Encounter (Signed)
Called patient to go over lab and CT results.  CT showed continuing mild diverticulitis but no complications, fatty liver, labs showed slightly elevated sed rate, no leukocytosis, normal kidney, alk phos slightly elevated.  Patient previously treated with Augmentin 2 months ago, has had a smoldering diverticulitis.  Will do 10 days of Cipro Flagyl, will call if any issues. Please set up 6 to 8-week follow-up, will discuss scheduling colonoscopy at that point after diverticulitis resolution.

## 2023-07-03 ENCOUNTER — Ambulatory Visit: Payer: Medicare Other

## 2023-07-03 DIAGNOSIS — A09 Infectious gastroenteritis and colitis, unspecified: Secondary | ICD-10-CM

## 2023-07-03 DIAGNOSIS — R110 Nausea: Secondary | ICD-10-CM

## 2023-07-03 DIAGNOSIS — R1032 Left lower quadrant pain: Secondary | ICD-10-CM

## 2023-07-05 LAB — CLOSTRIDIUM DIFFICILE TOXIN B, QUALITATIVE, REAL-TIME PCR: Toxigenic C. Difficile by PCR: NOT DETECTED

## 2023-07-10 ENCOUNTER — Telehealth: Payer: Self-pay | Admitting: Physician Assistant

## 2023-07-10 NOTE — Telephone Encounter (Signed)
PT is calling to find out when her colonoscopy will be scheduled. I advised her this would be discussed at her appointment in August. She is set to be out of town for 6 weeks in September and wants to have it done sooner than later. Please advise.

## 2023-07-11 NOTE — Telephone Encounter (Signed)
Returned call to patient. I informed pt that unfortunately we will not be able to tell when her she will be scheduled for colonoscopy at this time. Pt has been advised that she will need to keep office visit as scheduled for reassessment of diverticulitis. Pt has been advised that if diverticulitis was not resolved she would not be able to proceed with colonoscopy either way. Pt verbalized understanding and had no concerns at the end of the call.

## 2023-08-15 ENCOUNTER — Other Ambulatory Visit (INDEPENDENT_AMBULATORY_CARE_PROVIDER_SITE_OTHER): Payer: Medicare Other

## 2023-08-15 ENCOUNTER — Encounter: Payer: Self-pay | Admitting: Physician Assistant

## 2023-08-15 ENCOUNTER — Ambulatory Visit: Payer: Medicare Other | Admitting: Physician Assistant

## 2023-08-15 VITALS — BP 126/82 | HR 75 | Ht <= 58 in | Wt 183.0 lb

## 2023-08-15 DIAGNOSIS — K760 Fatty (change of) liver, not elsewhere classified: Secondary | ICD-10-CM | POA: Diagnosis not present

## 2023-08-15 DIAGNOSIS — K219 Gastro-esophageal reflux disease without esophagitis: Secondary | ICD-10-CM

## 2023-08-15 DIAGNOSIS — R1032 Left lower quadrant pain: Secondary | ICD-10-CM

## 2023-08-15 DIAGNOSIS — Z8719 Personal history of other diseases of the digestive system: Secondary | ICD-10-CM

## 2023-08-15 LAB — CBC WITH DIFFERENTIAL/PLATELET
Basophils Absolute: 0.1 10*3/uL (ref 0.0–0.1)
Basophils Relative: 0.6 % (ref 0.0–3.0)
Eosinophils Absolute: 0.3 10*3/uL (ref 0.0–0.7)
Eosinophils Relative: 3.2 % (ref 0.0–5.0)
HCT: 45.6 % (ref 36.0–46.0)
Hemoglobin: 15 g/dL (ref 12.0–15.0)
Lymphocytes Relative: 27.3 % (ref 12.0–46.0)
Lymphs Abs: 2.4 10*3/uL (ref 0.7–4.0)
MCHC: 32.9 g/dL (ref 30.0–36.0)
MCV: 85.8 fl (ref 78.0–100.0)
Monocytes Absolute: 0.6 10*3/uL (ref 0.1–1.0)
Monocytes Relative: 6.9 % (ref 3.0–12.0)
Neutro Abs: 5.4 10*3/uL (ref 1.4–7.7)
Neutrophils Relative %: 62 % (ref 43.0–77.0)
Platelets: 221 10*3/uL (ref 150.0–400.0)
RBC: 5.32 Mil/uL — ABNORMAL HIGH (ref 3.87–5.11)
RDW: 15.4 % (ref 11.5–15.5)
WBC: 8.7 10*3/uL (ref 4.0–10.5)

## 2023-08-15 LAB — COMPREHENSIVE METABOLIC PANEL
ALT: 17 U/L (ref 0–35)
AST: 18 U/L (ref 0–37)
Albumin: 4.2 g/dL (ref 3.5–5.2)
Alkaline Phosphatase: 129 U/L — ABNORMAL HIGH (ref 39–117)
BUN: 14 mg/dL (ref 6–23)
CO2: 29 mEq/L (ref 19–32)
Calcium: 9.4 mg/dL (ref 8.4–10.5)
Chloride: 98 mEq/L (ref 96–112)
Creatinine, Ser: 0.63 mg/dL (ref 0.40–1.20)
GFR: 89.15 mL/min (ref >60.00)
Glucose, Bld: 93 mg/dL (ref 70–99)
Potassium: 3.4 mEq/L — ABNORMAL LOW (ref 3.5–5.1)
Sodium: 137 mEq/L (ref 135–145)
Total Bilirubin: 1 mg/dL (ref 0.2–1.2)
Total Protein: 7.6 g/dL (ref 6.0–8.3)

## 2023-08-15 LAB — SEDIMENTATION RATE: Sed Rate: 43 mm/hr — ABNORMAL HIGH (ref 0–30)

## 2023-08-15 MED ORDER — NA SULFATE-K SULFATE-MG SULF 17.5-3.13-1.6 GM/177ML PO SOLN: 1.0000 | Freq: Once | ORAL | 0 refills | Status: AC

## 2023-08-15 NOTE — Patient Instructions (Addendum)
Your provider has requested that you go to the basement level for lab work before leaving today. Press "B" on the elevator. The lab is located at the first door on the left as you exit the elevator.  Miralax is an osmotic laxative.  It only brings more water into the stool.  This is safe to take daily.  Can take up to 17 gram of miralax twice a day.  Mix with juice or coffee.  Start 1 capful at night for 3-4 days and reassess your response in 3-4 days.  You can increase and decrease the dose based on your response.  Remember, it can take up to 3-4 days to take effect OR for the effects to wear off.   I often pair this with benefiber in the morning to help assure the stool is not too loose.   Can take dicyclomine at least 1-2 x a day for pain if needed.   Can do heating pad and can take tylenol max of 3000mg  a day.  Can add on lidocaine patches or voltern gel Go to the ER if unable to pass gas, severe AB pain, unable to hold down food, any shortness of breath of chest pain.   Diverticulitis Diverticulitis is inflammation or infection of small pouches in your colon that form when you have a condition called diverticulosis. The pouches in your colon are called diverticula. Your colon, or large intestine, is where water is absorbed and stool is formed. Complications of diverticulitis can include: Bleeding. Severe infection. Severe pain. Perforation of your colon. Obstruction of your colon.  What are the causes? Diverticulitis is caused by bacteria. Diverticulitis happens when stool becomes trapped in diverticula. This allows bacteria to grow in the diverticula, which can lead to inflammation and infection. What increases the risk? People with diverticulosis are at risk for diverticulitis. Eating a diet that does not include enough fiber from fruits and vegetables may make diverticulitis more likely to develop. What are the signs or symptoms? Symptoms of diverticulitis may  include: Abdominal pain and tenderness. The pain is normally located on the left side of the abdomen, but may occur in other areas. Fever and chills. Bloating. Cramping. Nausea. Vomiting. Constipation. Diarrhea. Blood in your stool.  How is this diagnosed? Your health care provider will ask you about your medical history and do a physical exam. You may need to have tests done because many medical conditions can cause the same symptoms as diverticulitis. Tests may include: Blood tests. Urine tests. Imaging tests of the abdomen, including X-rays and CT scans.  When your condition is under control, your health care provider may recommend that you have a colonoscopy. A colonoscopy can show how severe your diverticula are and whether something else is causing your symptoms. How is this treated? Most cases of diverticulitis are mild and can be treated at home. Treatment may include: Taking over-the-counter pain medicines. Following a clear liquid diet. Taking antibiotic medicines by mouth for 7-10 days.  More severe cases may be treated at a hospital. Treatment may include: Not eating or drinking. Taking prescription pain medicine. Receiving antibiotic medicines through an IV tube. Receiving fluids and nutrition through an IV tube. Surgery.  Follow these instructions at home: Follow your health care provider's instructions carefully. Follow a full liquid diet or other diet as directed by your health care provider. After your symptoms improve, your health care provider may tell you to change your diet. He or she may recommend you eat a high-fiber diet.  Fruits and vegetables are good sources of fiber. Fiber makes it easier to pass stool. Take fiber supplements or probiotics as directed by your health care provider. Only take medicines as directed by your health care provider. Keep all your follow-up appointments. Contact a health care provider if: Your pain does not improve. You have a  hard time eating food. Your bowel movements do not return to normal. Get help right away if: Your pain becomes worse. Your symptoms do not get better. Your symptoms suddenly get worse. You have a fever. You have repeated vomiting. You have bloody or black, tarry stools. This information is not intended to replace advice given to you by your health care provider. Make sure you discuss any questions you have with your health care provider. Document Released: 09/13/2005 Document Revised: 05/11/2016 Document Reviewed: 10/29/2013 Elsevier Interactive Patient Education  2017 [employer]vier Inc.  _______________________________________________________  If your blood pressure at your visit was 140/90 or greater, please contact your primary care physician to follow up on this.  _______________________________________________________  If you are age 59 or older, your body mass index should be between 23-30. Your Body mass index is 38.25 kg/m. If this is out of the aforementioned range listed, please consider follow up with your Primary Care Provider.  If you are age 40 or younger, your body mass index should be between 19-25. Your Body mass index is 38.25 kg/m. If this is out of the aformentioned range listed, please consider follow up with your Primary Care Provider.   ________________________________________________________  The [facility] providers would like to encourage you to use [facility] to communicate with providers for non-urgent requests or questions.  Due to long hold times on the telephone, sending your provider a message by [facility] may be a faster and more efficient way to get a response.  Please allow 48 business hours for a response.  Please remember that this is for non-urgent requests.  _______________________________________________________ It was a pleasure to see you today!  Thank you for trusting me with your gastrointestinal care!

## 2023-08-15 NOTE — Progress Notes (Signed)
08/15/2023 Shawna Gonzalez [id number] 1951/10/17  Referring provider: Rodrigo Ran, MD Primary GI doctor: Dr. Lavon Paganini  ASSESSMENT AND PLAN:   Abdominal pain, LLQ with associated diarrhea, nausea Patient had diverticulitis in May, treated augmentin with continued pain then again in July CT confirmed uncomplicated diverticulitis treated with Cipro Flagyl improvement of symptoms. Overall bloating is less, abdominal pain is less but she still has left lower quadrant Donnell pain.  No fevers chills nausea, overall feels better.. Will recheck CBC and sed rate but I do believe this is more of a IBS or constipation discomfort. Will plan on repeat colonoscopy with Dr. Lavon Paganini as patient is overdue at [facility].  We have discussed the risks of bleeding, infection, perforation, medication reactions, and remote risk of death associated with colonoscopy. All questions were answered and the patient acknowledges these risk and wishes to proceed. Can potentially have referral to surgery for evaluation of surgery for recurrent diverticulitis.  GERD, nausea, abdominal bloating, history of H. Pylori Increase PPI Bid last time, still some GERD Will plan on endoscopic evaluation with colonoscopy, if this is negative can consider SIBO treatment  Morbid obesity (HCC) with fatty liver -  FIB4 is 1.37 from last year LFTs, no fibrosis - need LFTs and CBC monitored every 6 months, revaluation every 2-3 years.  --Continue to work on risk factor modification including diet exercise and control of risk factors including blood sugars.    Patient Care Team: Rodrigo Ran, MD as PCP - General (Internal Medicine) Rennis Golden Lisette Abu, MD as Consulting Physician (Cardiology)  HISTORY OF PRESENT ILLNESS: 72 y.o. female with a past medical history of ulcerative colitis, sleep apnea on CPAP, not on oxygen, history of H. pylori gastritis with eradication 2017, chronic GERD, type 2 diabetes, melanoma of skin, personal  history of adenomatous polyps and others listed below presents for evaluation of cramping, bloating, abdominal pain loss of appetite.   05/24/2020 colonoscopy 3 polyps 5 to 9 mm size transverse descending colon, ascending colon, 1 polyp rectum less than 1 mm, suspicious of diverticulitis, Recall 05/2023 02/08/2022 EGD gastroesophageal flap Hill grade 2, normal esophagus, gastric erosions no bleeding negative H. pylori, gastritis normal duodenum 05/01/2023 CT abdomen pelvis left lower quadrant abdominal pain mild diverticulitis descending proximal sigmoid colon no perforation or abscess, hepatic steatosis 06/29/2023 OV with myself for AB pain/bloating.  06/30/2023 CTAP WC showed continuing mild diverticulitis but no complications, fatty liver, labs showed slightly elevated sed rate, no leukocytosis, normal kidney, alk phos slightly elevated . Sent in cipro/flagyl, previously treated augmentin. Negative Cdiff.  Here for follow up and discuss colonoscopy.   She finished the ABX, August 12th has OV with PCP and had some AB pain during exam, given augmentin but did not take it.  States still some AB bloating but improved. She only has AB if she pushes on it. She denies fever, chills. Has had some nausea but no vomiting.  She has BM once a week, no hematochezia, no melena.  She had CT of her sinuses and had mohs procedure on her nose. ' Mild GERD, no dysphagia. She is on nexium twice a day increased from last time, still some GERD, would like to get EGD with Colon.  She denies blood thinner use.  She denies NSAID use.  She denies ETOH use.   She denies tobacco use.  She denies drug use.    She  reports that she has never smoked. She has never used smokeless tobacco. She reports that she  does not drink alcohol and does not use drugs.  RELEVANT LABS AND IMAGING: CBC    Component Value Date/Time   WBC 10.0 06/29/2023 1601   RBC 5.33 (H) 06/29/2023 1601   HGB 14.9 06/29/2023 1601   HGB 14.0  07/07/2022 1205   HCT 44.6 06/29/2023 1601   HCT 40.9 07/07/2022 1205   PLT 225.0 06/29/2023 1601   PLT 251 07/07/2022 1205   MCV 83.7 06/29/2023 1601   MCV 80 07/07/2022 1205   MCH 27.5 07/07/2022 1205   MCH 27.9 01/10/2017 0514   MCHC 33.5 06/29/2023 1601   RDW 15.9 (H) 06/29/2023 1601   RDW 14.9 07/07/2022 1205   LYMPHSABS 2.8 06/29/2023 1601   MONOABS 0.6 06/29/2023 1601   EOSABS 0.2 06/29/2023 1601   BASOSABS 0.1 06/29/2023 1601   Recent Labs    06/29/23 1601  HGB 14.9    CMP     Component Value Date/Time   NA 137 06/29/2023 1601   NA 138 07/07/2022 1205   K 3.7 06/29/2023 1601   CL 98 06/29/2023 1601   CO2 29 06/29/2023 1601   GLUCOSE 103 (H) 06/29/2023 1601   BUN 11 06/29/2023 1601   BUN 13 07/07/2022 1205   CREATININE 0.69 06/29/2023 1601   CALCIUM 9.8 06/29/2023 1601   PROT 7.4 06/29/2023 1601   PROT 6.9 07/07/2022 1205   ALBUMIN 4.1 06/29/2023 1601   ALBUMIN 4.2 07/07/2022 1205   AST 18 06/29/2023 1601   ALT 25 06/29/2023 1601   ALKPHOS 132 (H) 06/29/2023 1601   BILITOT 0.8 06/29/2023 1601   BILITOT 0.4 07/07/2022 1205   GFRNONAA >60 01/09/2017 1137   GFRAA >60 01/09/2017 1137      Latest Ref Rng & Units 06/29/2023    4:01 PM 07/07/2022   12:05 PM 01/18/2022    2:19 PM  Hepatic Function  Total Protein 6.0 - 8.3 g/dL 7.4  6.9  8.2   Albumin 3.5 - 5.2 g/dL 4.1  4.2  4.4   AST 0 - 37 U/L 18  20  15    ALT 0 - 35 U/L 25  17  15    Alk Phosphatase 39 - 117 U/L 132  116  114   Total Bilirubin 0.2 - 1.2 mg/dL 0.8  0.4  0.8       Current Medications:   Current Outpatient Medications (Endocrine & Metabolic):    TYMLOS 3120 MCG/1.56ML SOPN, Inject into the skin. Every 2 years   Current Outpatient Medications (Cardiovascular):    atorvastatin (LIPITOR) 10 MG tablet, Take 1 tablet by mouth daily.   furosemide (LASIX) 40 MG tablet, Take 40 mg by mouth every evening.    losartan (COZAAR) 25 MG tablet, Take 25 mg by mouth daily.   Current Outpatient  Medications (Respiratory):    albuterol (PROVENTIL HFA;VENTOLIN HFA) 108 (90 BASE) MCG/ACT inhaler, Inhale 2 puffs into the lungs every 6 (six) hours as needed for wheezing or shortness of breath.   budesonide-formoterol (SYMBICORT) 160-4.5 MCG/ACT inhaler, Inhale 2 puffs into the lungs daily.   HYDROcodone bit-homatropine (HYCODAN) 5-1.5 MG/5ML syrup, 5 mL as needed Orally every 6 hours for cough, caution on sedation for 10 days   levocetirizine (XYZAL) 5 MG tablet, Take 5 mg by mouth every evening.   Current Outpatient Medications (Analgesics):    traMADol (ULTRAM) 50 MG tablet, Take 1 tablet (50 mg total) by mouth every 6 (six) hours as needed for moderate pain.     Current Outpatient Medications (Other):  ciprofloxacin (CIPRO) 500 MG tablet, Take 1 tablet (500 mg total) by mouth 2 (two) times daily.   dicyclomine (BENTYL) 10 MG capsule, TAKE 1 CAPSULE(10 MG) BY MOUTH FOUR TIMES DAILY BEFORE MEALS AND AT BEDTIME   dorzolamide (TRUSOPT) 2 % ophthalmic solution, INSTILL 1 DROP INTO BOTH EYES THREE TIMES DAILY   esomeprazole (NEXIUM) 40 MG capsule, Take 1 capsule (40 mg total) by mouth 2 (two) times daily.   NEOMYCIN-POLYMYXIN-HYDROCORTISONE (CORTISPORIN) 1 % SOLN OTIC solution, Apply 1-2 drops to toe BID after soaking   Travoprost, BAK Free, (TRAVATAN) 0.004 % SOLN ophthalmic solution, PLACE 1 DROP INTO BOTH EYES NIGHTLY   triamcinolone cream (KENALOG) 0.1 %, as needed (rash).   Vitamin D, Ergocalciferol, (DRISDOL) 50000 UNITS CAPS, Take 50,000 Units by mouth 2 (two) times a week. Sunday & Thursday  Current Facility-Administered Medications (Other):    0.9 %  sodium chloride infusion  Medical History:  Past Medical History:  Diagnosis Date   Adenomatous colon polyp    Anemia    history of   Arthritis    Asthma    Atypical chest pain    Bacterial ear infection 2018   Cancer (HCC)    melanoma skin cancer 7 years ago   Cataract    Diabetes mellitus without complication (HCC)     history of taking diabetic medication, has lost weight no longer needing medciation at this time   GERD (gastroesophageal reflux disease)    Glaucoma    Heart murmur    slight   Hypertension    Macular degeneration    Osteoporosis    Sleep apnea    cpap   Ulcerative colitis, chronic (HCC)    Allergies:  Allergies  Allergen Reactions   Amlodipine Cough   Aspirin     Other Reaction(s): stopped 2018 due to gi  issues but not sure that stopping it made any real difference.   Bydureon [Exenatide] Itching and Other (See Comments)    KNOTS IN SKIN   Lisinopril Cough   Lunesta [Eszopiclone] Other (See Comments)    Metallic taste in mouth   Zoledronic Acid     Other Reaction(s): suspected ONJ issues 7/17   Sulfa Antibiotics Rash     Surgical History:  She  has a past surgical history that includes Cataract extraction w/ intraocular lens  implant, bilateral (Bilateral, 05/21/2007(Right); 06/04/2007(Left)); De Quervain's release (Right, 11/30/2005); Carpal tunnel release (Right, 12/16/2009); Cholecystectomy (2000); Tonsillectomy; Upper gi endoscopy (03/05/2017); Hysterectomy abdominal with salpingectomy (Bilateral, 01/09/2017); Colonoscopy with propofol (01/23/2012); and Trigger finger release (Right). Family History:  Her family history includes COPD in her mother; Colon polyps in her brother, father, and mother; Heart disease in her father and mother; Sleep apnea in her nephew.  REVIEW OF SYSTEMS  : All other systems reviewed and negative except where noted in the History of Present Illness.  PHYSICAL EXAM: There were no vitals taken for this visit. General Appearance: Well nourished, in no apparent distress. Head:   Normocephalic and atraumatic. Eyes:  sclerae anicteric,conjunctive pink  Respiratory: Respiratory effort normal, BS equal bilaterally without rales, rhonchi, wheezing. Cardio: RRR with no MRGs. Peripheral pulses intact.  Abdomen: Soft,  Obese ,active bowel sounds. mild  tenderness in the periumbilical area. With guarding and Without rebound. No masses. Rectal: Not evaluated Musculoskeletal: Full ROM, Normal gait. Without edema. Skin:  Dry and intact without significant lesions or rashes Neuro: Alert and  oriented x4;  No focal deficits. Psych:  Cooperative. Normal mood and affect.  Doree Albee, PA-C 8:39 AM

## 2023-10-22 ENCOUNTER — Encounter: Payer: Self-pay | Admitting: Gastroenterology

## 2023-10-29 ENCOUNTER — Telehealth: Payer: Self-pay | Admitting: Physician Assistant

## 2023-11-01 ENCOUNTER — Ambulatory Visit: Payer: Medicare Other | Admitting: Gastroenterology

## 2023-11-01 ENCOUNTER — Encounter: Payer: Self-pay | Admitting: Gastroenterology

## 2023-11-01 VITALS — BP 121/61 | HR 68 | Temp 97.6°F | Resp 11 | Ht <= 58 in | Wt 183.0 lb

## 2023-11-01 DIAGNOSIS — K2950 Unspecified chronic gastritis without bleeding: Secondary | ICD-10-CM | POA: Diagnosis not present

## 2023-11-01 DIAGNOSIS — K2970 Gastritis, unspecified, without bleeding: Secondary | ICD-10-CM | POA: Diagnosis not present

## 2023-11-01 DIAGNOSIS — K31A0 Gastric intestinal metaplasia, unspecified: Secondary | ICD-10-CM | POA: Diagnosis not present

## 2023-11-01 DIAGNOSIS — K219 Gastro-esophageal reflux disease without esophagitis: Secondary | ICD-10-CM

## 2023-11-01 DIAGNOSIS — R1084 Generalized abdominal pain: Secondary | ICD-10-CM

## 2023-11-01 DIAGNOSIS — D128 Benign neoplasm of rectum: Secondary | ICD-10-CM

## 2023-11-01 MED ORDER — SODIUM CHLORIDE 0.9 % IV SOLN
500.0000 mL | Freq: Once | INTRAVENOUS | Status: DC
Start: 2023-11-01 — End: 2023-11-01

## 2023-11-01 NOTE — Op Note (Signed)
[facility] Patient Name: Shawna Gonzalez Procedure Date: 11/01/2023 10:57 AM MRN: [id number] Endoscopist: Napoleon Form , MD, [id number] Age: 72 Referring MD:  Date of Birth: Sep 03, 1951 Gender: Female Account #: [id number] Procedure:                Colonoscopy Indications:              Generalized abdominal pain Medicines:                Monitored Anesthesia Care Procedure:                Pre-Anesthesia Assessment:                           - Prior to the procedure, a History and Physical                            was performed, and patient medications and                            allergies were reviewed. The patient's tolerance of                            previous anesthesia was also reviewed. The risks                            and benefits of the procedure and the sedation                            options and risks were discussed with the patient.                            All questions were answered, and informed consent                            was obtained. Prior Anticoagulants: The patient has                            taken no anticoagulant or antiplatelet agents. ASA                            Grade Assessment: III - A patient with severe                            systemic disease. After reviewing the risks and                            benefits, the patient was deemed in satisfactory                            condition to undergo the procedure.                           After obtaining informed consent, the colonoscope  was passed under direct vision. Throughout the                            procedure, the patient's blood pressure, pulse, and                            oxygen saturations were monitored continuously. The                            [id number]-HQ190L Colonoscope [id number] was introduced                            through the anus and advanced to the the cecum,                            identified by  appendiceal orifice and ileocecal                            valve. The colonoscopy was performed without                            difficulty. The patient tolerated the procedure                            well. The quality of the bowel preparation was                            good. The ileocecal valve, appendiceal orifice, and                            rectum were photographed. Scope In: 11:11:57 AM Scope Out: 11:24:21 AM Scope Withdrawal Time: 0 hours 7 minutes 42 seconds  Total Procedure Duration: 0 hours 12 minutes 24 seconds  Findings:                 The perianal and digital rectal examinations were                            normal.                           Multiple large-mouthed, medium-mouthed and                            small-mouthed diverticula were found in the sigmoid                            colon, descending colon, transverse colon,                            ascending colon and cecum.                           A 4 mm polyp was found in the rectum. The polyp was  sessile. The polyp was removed with a cold snare.                            Resection and retrieval were complete.                           Non-bleeding external and internal hemorrhoids were                            found during retroflexion. The hemorrhoids were                            small. Complications:            No immediate complications. Estimated Blood Loss:     Estimated blood loss was minimal. Impression:               - Moderate diverticulosis in the sigmoid colon, in                            the descending colon, in the transverse colon, in                            the ascending colon and in the cecum.                           - One 4 mm polyp in the rectum, removed with a cold                            snare. Resected and retrieved.                           - Non-bleeding external and internal hemorrhoids. Recommendation:           - Patient has a  contact number available for                            emergencies. The signs and symptoms of potential                            delayed complications were discussed with the                            patient. Return to normal activities tomorrow.                            Written discharge instructions were provided to the                            patient.                           - Resume previous diet.                           - Continue present medications.                           -  Await pathology results.                           - Repeat colonoscopy in 5-10 years for surveillance                            based on pathology results.                           - Return to GI clinic at the next available                            appointment. Napoleon Form, MD 11/01/2023 11:34:53 AM This report has been signed electronically.

## 2023-11-01 NOTE — Patient Instructions (Addendum)
- Resume previous diet. - Continue present medications. - Await pathology results. - Follow an antireflux regimen. - Repeat colonoscopy in 5-10 years for surveillance based on pathology results. - Return to GI clinic at the next available appointment.   YOU HAD AN ENDOSCOPIC PROCEDURE TODAY AT THE [facility]:   Refer to the procedure report that was given to you for any specific questions about what was found during the examination.  If the procedure report does not answer your questions, please call your gastroenterologist to clarify.  If you requested that your care partner not be given the details of your procedure findings, then the procedure report has been included in a sealed envelope for you to review at your convenience later.  YOU SHOULD EXPECT: Some feelings of bloating in the abdomen. Passage of more gas than usual.  Walking can help get rid of the air that was put into your GI tract during the procedure and reduce the bloating. If you had a lower endoscopy (such as a colonoscopy or flexible sigmoidoscopy) you may notice spotting of blood in your stool or on the toilet paper. If you underwent a bowel prep for your procedure, you may not have a normal bowel movement for a few days.  Please Note:  You might notice some irritation and congestion in your nose or some drainage.  This is from the oxygen used during your procedure.  There is no need for concern and it should clear up in a day or so.  SYMPTOMS TO REPORT IMMEDIATELY:  Following lower endoscopy (colonoscopy or flexible sigmoidoscopy):  Excessive amounts of blood in the stool  Significant tenderness or worsening of abdominal pains  Swelling of the abdomen that is new, acute  Fever of 100F or higher  Following upper endoscopy (EGD)  Vomiting of blood or coffee ground material  New chest pain or pain under the shoulder blades  Painful or persistently difficult swallowing  New shortness of breath  Fever of  100F or higher  Black, tarry-looking stools  For urgent or emergent issues, a gastroenterologist can be reached at any hour by calling [phone]. Do not use MyChart messaging for urgent concerns.    DIET:  We do recommend a small meal at first, but then you may proceed to your regular diet.  Drink plenty of fluids but you should avoid alcoholic beverages for 24 hours.  ACTIVITY:  You should plan to take it easy for the rest of today and you should NOT DRIVE or use heavy machinery until tomorrow (because of the sedation medicines used during the test).    FOLLOW UP: Our staff will call the number listed on your records the next business day following your procedure.  We will call around 7:15- 8:00 am to check on you and address any questions or concerns that you may have regarding the information given to you following your procedure. If we do not reach you, we will leave a message.     If any biopsies were taken you will be contacted by phone or by letter within the next 1-3 weeks.  Please call us at [phone] if you have not heard about the biopsies in 3 weeks.    SIGNATURES/CONFIDENTIALITY: You and/or your care partner have signed paperwork which will be entered into your electronic medical record.  These signatures attest to the fact that that the information above on your After Visit Summary has been reviewed and is understood.  Full responsibility of the confidentiality  of this discharge information lies with you and/or your care-partner.

## 2023-11-01 NOTE — Progress Notes (Signed)
[facility] History and Physical   Primary Care Physician:  Rodrigo Ran, MD   Reason for Procedure:  Persistent GERD symptoms, abdominal pain generalized , worse in LLQ and epigastric, and intermittent nausea.  Plan:    EGD and colonoscopy with possible interventions as needed     HPI: Shawna Gonzalez is a very pleasant 71 y.o. female here for EGD and colonoscopy for persistent GERD symptoms, abdominal pain generalized , worse in LLQ and epigastric, and intermittent nausea.   The risks and benefits as well as alternatives of endoscopic procedure(s) have been discussed and reviewed. All questions answered. The patient agrees to proceed.    Past Medical History:  Diagnosis Date   Adenomatous colon polyp    Anemia    history of   Arthritis    Asthma    Atypical chest pain    Bacterial ear infection 2018   Cancer (HCC)    melanoma skin cancer 7 years ago   Cataract    Diabetes mellitus without complication (HCC)    history of taking diabetic medication, has lost weight no longer needing medciation at this time   GERD (gastroesophageal reflux disease)    Glaucoma    Heart murmur    slight   Hypertension    Macular degeneration    Osteoporosis    Sleep apnea    cpap   Ulcerative colitis, chronic (HCC)     Past Surgical History:  Procedure Laterality Date   CARPAL TUNNEL RELEASE Right 12/16/2009   CATARACT EXTRACTION W/ INTRAOCULAR LENS  IMPLANT, BILATERAL Bilateral 05/21/2007(Right); 06/04/2007(Left)   CHOLECYSTECTOMY  2000   COLONOSCOPY WITH PROPOFOL  01/23/2012   DE QUERVAIN'S RELEASE Right 11/30/2005   HYSTERECTOMY ABDOMINAL WITH SALPINGECTOMY Bilateral 01/09/2017   Procedure: HYSTERECTOMY ABDOMINAL WITH SALPINGOOPHORECTOMY;  Surgeon: Marcelle Overlie, MD;  Location: [facility];  Service: Gynecology;  Laterality: Bilateral;   TONSILLECTOMY     TRIGGER FINGER RELEASE Right    UPPER GI ENDOSCOPY  03/05/2017   MAC    Prior to Admission medications    Medication Sig Start Date End Date Taking? Authorizing Provider  dicyclomine (BENTYL) 10 MG capsule TAKE 1 CAPSULE(10 MG) BY MOUTH FOUR TIMES DAILY BEFORE MEALS AND AT BEDTIME 10/03/22  Yes Sueanne Maniaci V, MD  dorzolamide (TRUSOPT) 2 % ophthalmic solution INSTILL 1 DROP INTO BOTH EYES THREE TIMES DAILY 09/26/18  Yes Dohmeier, Porfirio Mylar, MD  esomeprazole (NEXIUM) 40 MG capsule Take 1 capsule (40 mg total) by mouth 2 (two) times daily. 03/16/22  Yes Kristyanna Barcelo, Eleonore Chiquito, MD  furosemide (LASIX) 40 MG tablet Take 40 mg by mouth every evening.  07/24/15  Yes [provider]  levocetirizine (XYZAL) 5 MG tablet Take 5 mg by mouth every evening.   Yes [provider]  losartan (COZAAR) 25 MG tablet Take 25 mg by mouth daily.   Yes [provider]  Travoprost, BAK Free, (TRAVATAN) 0.004 % SOLN ophthalmic solution PLACE 1 DROP INTO BOTH EYES NIGHTLY 12/14/15  Yes [provider]  TYMLOS 3120 MCG/1.56ML SOPN Inject into the skin. Every 2 years 10/10/22  Yes [provider]  Vitamin D, Ergocalciferol, (DRISDOL) 50000 UNITS CAPS Take 50,000 Units by mouth 2 (two) times a week. Sunday & Thursday   Yes [provider]  albuterol (PROVENTIL HFA;VENTOLIN HFA) 108 (90 BASE) MCG/ACT inhaler Inhale 2 puffs into the lungs every 6 (six) hours as needed for wheezing or shortness of breath.    [provider]  atorvastatin (LIPITOR) 10 MG  tablet Take 1 tablet by mouth daily.    [provider]  budesonide-formoterol (SYMBICORT) 160-4.5 MCG/ACT inhaler Inhale 2 puffs into the lungs daily.    [provider]  HYDROcodone bit-homatropine (HYCODAN) 5-1.5 MG/5ML syrup 5 mL as needed Orally every 6 hours for cough, caution on sedation for 10 days Patient not taking: Reported on 11/01/2023 04/02/23   [provider]  NEOMYCIN-POLYMYXIN-HYDROCORTISONE (CORTISPORIN) 1 % SOLN OTIC solution Apply 1-2 drops to toe BID after soaking Patient not  taking: Reported on 11/01/2023 01/30/23   Hyatt, Max T, DPM  traMADol (ULTRAM) 50 MG tablet Take 1 tablet (50 mg total) by mouth every 6 (six) hours as needed for moderate pain. 01/11/17   Marcelle Overlie, MD  triamcinolone cream (KENALOG) 0.1 % as needed (rash). 08/16/22   [provider]    Current Outpatient Medications  Medication Sig Dispense Refill   dicyclomine (BENTYL) 10 MG capsule TAKE 1 CAPSULE(10 MG) BY MOUTH FOUR TIMES DAILY BEFORE MEALS AND AT BEDTIME 90 capsule 6   dorzolamide (TRUSOPT) 2 % ophthalmic solution INSTILL 1 DROP INTO BOTH EYES THREE TIMES DAILY 30 mL 0   esomeprazole (NEXIUM) 40 MG capsule Take 1 capsule (40 mg total) by mouth 2 (two) times daily. 60 capsule 6   furosemide (LASIX) 40 MG tablet Take 40 mg by mouth every evening.   4   levocetirizine (XYZAL) 5 MG tablet Take 5 mg by mouth every evening.     losartan (COZAAR) 25 MG tablet Take 25 mg by mouth daily.     Travoprost, BAK Free, (TRAVATAN) 0.004 % SOLN ophthalmic solution PLACE 1 DROP INTO BOTH EYES NIGHTLY     TYMLOS 3120 MCG/1.56ML SOPN Inject into the skin. Every 2 years     Vitamin D, Ergocalciferol, (DRISDOL) 50000 UNITS CAPS Take 50,000 Units by mouth 2 (two) times a week. Sunday & Thursday     albuterol (PROVENTIL HFA;VENTOLIN HFA) 108 (90 BASE) MCG/ACT inhaler Inhale 2 puffs into the lungs every 6 (six) hours as needed for wheezing or shortness of breath.     atorvastatin (LIPITOR) 10 MG tablet Take 1 tablet by mouth daily.     budesonide-formoterol (SYMBICORT) 160-4.5 MCG/ACT inhaler Inhale 2 puffs into the lungs daily.     HYDROcodone bit-homatropine (HYCODAN) 5-1.5 MG/5ML syrup 5 mL as needed Orally every 6 hours for cough, caution on sedation for 10 days (Patient not taking: Reported on 11/01/2023)     NEOMYCIN-POLYMYXIN-HYDROCORTISONE (CORTISPORIN) 1 % SOLN OTIC solution Apply 1-2 drops to toe BID after soaking (Patient not taking: Reported on 11/01/2023) 10 mL 1   traMADol (ULTRAM) 50 MG  tablet Take 1 tablet (50 mg total) by mouth every 6 (six) hours as needed for moderate pain. 30 tablet 0   triamcinolone cream (KENALOG) 0.1 % as needed (rash).     Current Facility-Administered Medications  Medication Dose Route Frequency Provider Last Rate Last Admin   0.9 %  sodium chloride infusion  500 mL Intravenous Once Ameerah Huffstetler V, MD       0.9 %  sodium chloride infusion  500 mL Intravenous Once Napoleon Form, MD        Allergies as of 11/01/2023 - Review Complete 11/01/2023  Allergen Reaction Noted   Amlodipine Cough 08/03/2015   Aspirin  07/31/2017   Bydureon [exenatide] Itching and Other (See Comments) 08/03/2015   Lisinopril Cough 08/03/2015   Lunesta [eszopiclone] Other (See Comments) 08/03/2015   Zoledronic acid  07/09/2016   Sulfa antibiotics  Rash 01/09/2012    Family History  Problem Relation Age of Onset   Colon polyps Mother    Heart disease Mother    COPD Mother        early signs of   Colon polyps Father    Heart disease Father    Colon polyps Brother    Sleep apnea Nephew    Colon cancer Neg Hx    Esophageal cancer Neg Hx    Stomach cancer Neg Hx    Rectal cancer Neg Hx     Social History   Socioeconomic History   Marital status: Single    Spouse name: Not on file   Number of children: 0   Years of education: 16   Highest education level: Not on file  Occupational History   Occupation: customer service    Employer: [employer]  Tobacco Use   Smoking status: Never   Smokeless tobacco: Never  Vaping Use   Vaping status: Never Used  Substance and Sexual Activity   Alcohol use: No   Drug use: No   Sexual activity: Not on file  Other Topics Concern   Not on file  Social History Narrative   Lives at home with mother.   Caffeine use: Diet and caffeine free soda (8 drinks per day)         Epworth Sleepiness Scale = 7 (as of 01/26/2016)   Social Determinants of Health   Financial Resource Strain: Not on file  Food Insecurity:  Not on file  Transportation Needs: Not on file  Physical Activity: Not on file  Stress: Not on file  Social Connections: Not on file  Intimate Partner Violence: Not on file    Review of Systems:  All other review of systems negative except as mentioned in the HPI.  Physical Exam: Vital signs in last 24 hours: BP (!) 145/74   Pulse 71   Temp 97.6 F (36.4 C)   Ht 4\' 10"  (1.473 m)   Wt 183 lb (83 kg)   SpO2 98%   BMI 38.25 kg/m  General:   Alert, NAD Lungs:  Clear .   Heart:  Regular rate and rhythm Abdomen:  Soft, nontender and nondistended. Neuro/Psych:  Alert and cooperative. Normal mood and affect. A and O x 3  Reviewed labs, radiology imaging, old records and pertinent past GI work up  Patient is appropriate for planned procedure(s) and anesthesia in an ambulatory setting   K. Scherry Ran , MD [phone]

## 2023-11-01 NOTE — Op Note (Signed)
[facility] Patient Name: Shawna Gonzalez Procedure Date: 11/01/2023 10:58 AM MRN: [id number] Endoscopist: Napoleon Form , MD, [id number] Age: 72 Referring MD:  Date of Birth: [birth year] Gender: Female Account #: [id number] Procedure:                Upper GI endoscopy Indications:              Generalized abdominal pain, Esophageal reflux                            symptoms that persist despite appropriate therapy Medicines:                Monitored Anesthesia Care Procedure:                Pre-Anesthesia Assessment:                           - Prior to the procedure, a History and Physical                            was performed, and patient medications and                            allergies were reviewed. The patient's tolerance of                            previous anesthesia was also reviewed. The risks                            and benefits of the procedure and the sedation                            options and risks were discussed with the patient.                            All questions were answered, and informed consent                            was obtained. Prior Anticoagulants: The patient has                            taken no anticoagulant or antiplatelet agents. ASA                            Grade Assessment: III - A patient with severe                            systemic disease. After reviewing the risks and                            benefits, the patient was deemed in satisfactory                            condition to undergo the procedure.  After obtaining informed consent, the endoscope was                            passed under direct vision. Throughout the                            procedure, the patient's blood pressure, pulse, and                            oxygen saturations were monitored continuously. The                            GIF [id number] #[id number] was introduced through the                             mouth, and advanced to the second part of duodenum.                            The upper GI endoscopy was accomplished without                            difficulty. The patient tolerated the procedure                            well. Scope In: Scope Out: Findings:                 The Z-line was regular and was found 36 cm from the                            incisors.                           No gross lesions were noted in the entire esophagus.                           Patchy mild inflammation characterized by                            congestion (edema), erythema and friability was                            found in the entire examined stomach. Biopsies were                            taken with a cold forceps for Helicobacter pylori                            testing.                           The cardia and gastric fundus were normal on                            retroflexion.  The examined duodenum was normal. Complications:            No immediate complications. Estimated Blood Loss:     Estimated blood loss was minimal. Impression:               - Z-line regular, 36 cm from the incisors.                           - No gross lesions in the entire esophagus.                           - Gastritis. Biopsied.                           - Normal examined duodenum. Recommendation:           - Patient has a contact number available for                            emergencies. The signs and symptoms of potential                            delayed complications were discussed with the                            patient. Return to normal activities tomorrow.                            Written discharge instructions were provided to the                            patient.                           - Resume previous diet.                           - Continue present medications.                           - Await pathology results.                           -  Follow an antireflux regimen. Napoleon Form, MD 11/01/2023 11:36:54 AM This report has been signed electronically.

## 2023-11-01 NOTE — Progress Notes (Signed)
Sedate, gd SR, tolerated procedure well, VSS, report to RN 

## 2023-11-02 ENCOUNTER — Telehealth: Payer: Self-pay

## 2023-11-02 NOTE — Telephone Encounter (Signed)
  Follow up Call-     11/01/2023   10:12 AM 02/08/2022   10:23 AM  Call back number  Post procedure Call Back phone  # [phone]  Permission to leave phone message Yes Yes     Patient questions:  Do you have a fever, pain , or abdominal swelling? No. Pain Score  0 *  Have you tolerated food without any problems? Yes.    Have you been able to return to your normal activities? Yes.    Do you have any questions about your discharge instructions: Diet   No. Medications  No. Follow up visit  No.  Do you have questions or concerns about your Care? No.  Actions: * If pain score is 4 or above: No action needed, pain <4.

## 2023-11-06 LAB — SURGICAL PATHOLOGY

## 2023-11-23 ENCOUNTER — Telehealth: Payer: Self-pay | Admitting: Gastroenterology

## 2023-11-23 NOTE — Telephone Encounter (Signed)
Patient requesting to speak with a nurse in regards to results as well as belching symptoms she is having. Please advise.

## 2023-11-27 NOTE — Telephone Encounter (Signed)
Reason for call/chief complaint: Wants her results. Needs follow up appointment. Continues to have abdominal discomfort and bloating.  She has sore throat and cough presently. Advise/Recommendations

## 2023-11-28 ENCOUNTER — Encounter: Payer: Self-pay | Admitting: Gastroenterology

## 2023-11-28 NOTE — Telephone Encounter (Signed)
Called patient and left a message.  Please check if she can come in for office visit this afternoon.  Thank you

## 2023-11-28 NOTE — Telephone Encounter (Signed)
Spoke with the patient. She has fever today. She will call when she is over her URI and will get an appointment then.

## 2024-01-03 ENCOUNTER — Telehealth: Payer: Self-pay | Admitting: Adult Health

## 2024-01-03 NOTE — Telephone Encounter (Signed)
Pt called to check for an earlier appt

## 2024-01-21 ENCOUNTER — Ambulatory Visit: Payer: Medicare Other | Admitting: Pulmonary Disease

## 2024-01-21 ENCOUNTER — Encounter: Payer: Self-pay | Admitting: Pulmonary Disease

## 2024-01-21 VITALS — BP 120/70 | HR 95 | Ht <= 58 in | Wt 195.0 lb

## 2024-01-21 DIAGNOSIS — J4540 Moderate persistent asthma, uncomplicated: Secondary | ICD-10-CM

## 2024-01-21 MED ORDER — BUDESONIDE-FORMOTEROL FUMARATE 160-4.5 MCG/ACT IN AERO: 2.0000 | INHALATION_SPRAY | Freq: Every day | RESPIRATORY_TRACT | 5 refills | Status: AC

## 2024-01-21 NOTE — Patient Instructions (Signed)
Nice to see you again  No changes to medication  I think we should consider trying Trelegy for a few months leading up to the Thanksgiving to Christmas time, which seems to be your worst time of the year  Return to clinic in 6 months or sooner as needed with Dr. Judeth Horn

## 2024-01-21 NOTE — Progress Notes (Signed)
@Patient  ID: Shawna Gonzalez, female    DOB: 1951-03-29, 73 y.o.   MRN: [id number]  Chief Complaint  Patient presents with   Follow-up    73 y.o. Pt states she was sick back in December but much better now    Referring provider: Rodrigo Ran, MD  HPI:   73 y.o. woman with asthma whom we are seeing in follow up for evaluation of cough, recurrent bronchitis.  Most recent PCP note reviewed.  Most recent GI note reviewed.  11/2023 contracted upper respiratory illness.  Started with sore throat.  Then developed what she describes a sinus infection pain pressure etc.  Seen by PCP.  Given steroids and antibiotics, Z-Pak.  Traditionally Z-Pak's do not help her.  She had lingering symptoms despite 10 days of treatment.  Was seen again and prescribed different antibiotic.  Then things got better.  She notes day-to-day things otherwise doing well.  Rare rescue inhaler use.  She reports that Thanksgiving to the new year is usually her bad time of the year.  Otherwise does fine throughout the year.  We discussed role and rationale for escalating inhaler therapy to help minimize or prevent bouts of bronchitis.  Specifically triple inhaled therapy.  We discussed maybe doing this seasonally given predictable nature of her decompensation.  She expressed understanding and agreed.  HPI at initial visit: Overall, 73 doing well.  She states she checks her oxygen at home.  Usually 96 to 98%.  In the past when she had pneumonia is never dropped below 88%.  89% at 1 point time in the hospital.  She does describe dyspnea on exertion.  Comes and goes.  Worse on inclines or stairs.  No time of day when things are better or worse.  No seasonal environmental factors she can identify that make things better or worse.  No position make things better or worse.  Sometimes relieved with albuterol.  Use albuterol and Symbicort as needed at this point.  Do think they are a bit beneficial when she is feeling short of breath.  Sensation at  times at rest with difficulty getting a full breath and a deep breath in.  She has history of asthma.  [place] seems recurrent bronchitis.  Cough for many weeks.  Was worse when she was working but retired a couple years ago from [employer].  But does tend to have them at least once a year now.  1-2 times yearly prior.  Usually between Thanksgiving and New Year's.  Again, she does not use maintenance inhaler regularly.  Most recent chest imaging CT coronary scan 03/2021 with clear lungs bilaterally on my review interpretation of available lung fields.  PMH: Asthma, GERD, Surgical history: Carpal tunnel surgery, cataract surgery, cholecystectomy, tonsillectomy Family history: Mother with lung cancer, COPD, CAD, father with CAD Social history: Never smoker, lives in [place] / Pulmonary Flowsheets:   ACT:      No data to display          MMRC:     No data to display          Epworth:      No data to display          Tests:   FENO:  No results found for: "NITRICOXIDE"  PFT:    Latest Ref Rng & Units 08/16/2022    2:59 PM  PFT Results  FVC-Pre L 1.71   FVC-Predicted Pre % 78   FVC-Post L 1.70   FVC-Predicted Post % 77  Pre FEV1/FVC % % 89   Post FEV1/FCV % % 94   FEV1-Pre L 1.52   FEV1-Predicted Pre % 92   FEV1-Post L 1.60   DLCO uncorrected ml/min/mmHg 14.65   DLCO UNC% % 96   DLCO corrected ml/min/mmHg 14.39   DLCO COR %Predicted % 95   DLVA Predicted % 108   TLC L 3.48   TLC % Predicted % 86   RV % Predicted % 53   Personally reviewed interpreted normal spirometry, no bronchodilator response, lung volumes within normal limits with exception of severely reduced ERV, DLCO within normal limits  WALK:      No data to display          Imaging: Personally reviewed and as per EMR discussion in this note No results found.  Lab Results: Personally reviewed CBC    Component Value Date/Time   WBC 8.7 08/15/2023 1411   RBC 5.32 (H)  08/15/2023 1411   HGB 15.0 08/15/2023 1411   HGB 14.0 07/07/2022 1205   HCT 45.6 08/15/2023 1411   HCT 40.9 07/07/2022 1205   PLT 221.0 08/15/2023 1411   PLT 251 07/07/2022 1205   MCV 85.8 08/15/2023 1411   MCV 80 07/07/2022 1205   MCH 27.5 07/07/2022 1205   MCH 27.9 01/10/2017 0514   MCHC 32.9 08/15/2023 1411   RDW 15.4 08/15/2023 1411   RDW 14.9 07/07/2022 1205   LYMPHSABS 2.4 08/15/2023 1411   MONOABS 0.6 08/15/2023 1411   EOSABS 0.3 08/15/2023 1411   BASOSABS 0.1 08/15/2023 1411    BMET    Component Value Date/Time   NA 137 08/15/2023 1411   NA 138 07/07/2022 1205   K 3.4 (L) 08/15/2023 1411   CL 98 08/15/2023 1411   CO2 29 08/15/2023 1411   GLUCOSE 93 08/15/2023 1411   BUN 14 08/15/2023 1411   BUN 13 07/07/2022 1205   CREATININE 0.63 08/15/2023 1411   CALCIUM 9.4 08/15/2023 1411   GFRNONAA >60 01/09/2017 1137   GFRAA >60 01/09/2017 1137    BNP No results found for: "BNP"  ProBNP No results found for: "PROBNP"  Specialty Problems       Pulmonary Problems   Diaphragmatic hernia   Qualifier: Diagnosis of  By: Dorian Pod, Elita Quick   IMO SNOMED Dx Update Oct 2024      Chronic cough   Asthma, chronic   Respiratory failure with hypoxia and hypercapnia (HCC)   OSA on CPAP   Nocturnal hypoxemia   Obesity hypoventilation syndrome (HCC)    Allergies  Allergen Reactions   Amlodipine Cough   Aspirin     Other Reaction(s): stopped 2018 due to gi  issues but not sure that stopping it made any real difference.   Bydureon [Exenatide] Itching and Other (See Comments)    KNOTS IN SKIN   Lisinopril Cough   Lunesta [Eszopiclone] Other (See Comments)    Metallic taste in mouth   Zoledronic Acid     Other Reaction(s): suspected ONJ issues 7/17   Sulfa Antibiotics Rash    Immunization History  Administered Date(s) Administered   Influenza Whole 09/17/2012   Influenza,inj,quad, With Preservative 11/21/2017, 11/09/2020   Influenza-Unspecified 12/22/2016    Pneumococcal Polysaccharide-23 09/18/2007    Past Medical History:  Diagnosis Date   Adenomatous colon polyp    Anemia    history of   Arthritis    Asthma    Atypical chest pain    Bacterial ear infection 2018   Cancer (HCC)  melanoma skin cancer 7 years ago   Cataract    Diabetes mellitus without complication (HCC)    history of taking diabetic medication, has lost weight no longer needing medciation at this time   GERD (gastroesophageal reflux disease)    Glaucoma    Heart murmur    slight   Hypertension    Macular degeneration    Osteoporosis    Sleep apnea    cpap   Ulcerative colitis, chronic (HCC)     Tobacco History: Social History   Tobacco Use  Smoking Status Never  Smokeless Tobacco Never   Counseling given: Not Answered   Continue to not smoke  Outpatient Encounter Medications as of 01/21/2024  Medication Sig   albuterol (PROVENTIL HFA;VENTOLIN HFA) 108 (90 BASE) MCG/ACT inhaler Inhale 2 puffs into the lungs every 6 (six) hours as needed for wheezing or shortness of breath.   budesonide-formoterol (SYMBICORT) 160-4.5 MCG/ACT inhaler Inhale 2 puffs into the lungs daily.   dicyclomine (BENTYL) 10 MG capsule TAKE 1 CAPSULE(10 MG) BY MOUTH FOUR TIMES DAILY BEFORE MEALS AND AT BEDTIME   dorzolamide (TRUSOPT) 2 % ophthalmic solution INSTILL 1 DROP INTO BOTH EYES THREE TIMES DAILY   esomeprazole (NEXIUM) 40 MG capsule Take 1 capsule (40 mg total) by mouth 2 (two) times daily.   furosemide (LASIX) 40 MG tablet Take 40 mg by mouth every evening.    HYDROcodone bit-homatropine (HYCODAN) 5-1.5 MG/5ML syrup 5 mL as needed Orally every 6 hours for cough, caution on sedation for 10 days (Patient not taking: Reported on 11/01/2023)   levocetirizine (XYZAL) 5 MG tablet Take 5 mg by mouth every evening.   losartan (COZAAR) 25 MG tablet Take 25 mg by mouth daily.   NEOMYCIN-POLYMYXIN-HYDROCORTISONE (CORTISPORIN) 1 % SOLN OTIC solution Apply 1-2 drops to toe BID after  soaking (Patient not taking: Reported on 11/01/2023)   traMADol (ULTRAM) 50 MG tablet Take 1 tablet (50 mg total) by mouth every 6 (six) hours as needed for moderate pain.   Travoprost, BAK Free, (TRAVATAN) 0.004 % SOLN ophthalmic solution PLACE 1 DROP INTO BOTH EYES NIGHTLY   triamcinolone cream (KENALOG) 0.1 % as needed (rash).   TYMLOS 3120 MCG/1.56ML SOPN Inject into the skin. Every 2 years   Vitamin D, Ergocalciferol, (DRISDOL) 50000 UNITS CAPS Take 50,000 Units by mouth 2 (two) times a week. Sunday & Thursday   [DISCONTINUED] atorvastatin (LIPITOR) 10 MG tablet Take 1 tablet by mouth daily.   [DISCONTINUED] budesonide-formoterol (SYMBICORT) 160-4.5 MCG/ACT inhaler Inhale 2 puffs into the lungs daily.   No facility-administered encounter medications on file as of 01/21/2024.     Review of Systems  Review of Systems  N/a Physical Exam  BP 120/70 (BP Location: Left Arm, Patient Position: Sitting, Cuff Size: Large)   Pulse 95   Ht 4\' 10"  (1.473 m)   Wt 195 lb (88.5 kg)   SpO2 96%   BMI 40.76 kg/m   Wt Readings from Last 5 Encounters:  01/21/24 195 lb (88.5 kg)  11/01/23 183 lb (83 kg)  08/15/23 183 lb (83 kg)  06/29/23 190 lb 6 oz (86.4 kg)  05/07/23 196 lb 3.2 oz (89 kg)    BMI Readings from Last 5 Encounters:  01/21/24 40.76 kg/m  11/01/23 38.25 kg/m  08/15/23 38.25 kg/m  06/29/23 41.20 kg/m  05/07/23 41.01 kg/m     Physical Exam General: Sitting in chair, no acute distress Eyes: EOMI, no icterus Neck: Supple, no JVP appreciated Pulmonary: Clear, normal work of breathing  Cardiovascular warm, no edema Abdomen: Nondistended, bowel sounds present MSK: No synovitis, no joint effusion Neuro: Normal gait, no weakness Psych: Normal mood, full affect   Assessment & Plan:   Dyspnea on exertion: Suspect multifactorial.  Possibly poorly controlled asthma given her recurrent episodes of bronchitis to the year.  Also concern for weight and deconditioning  contributing.  Cardiology evaluation in the past has been reassuring.  PFTs within normal limits 07/2022.  Asthma: Day-to-day seems fairly well controlled but overall think poorly controlled given bouts of recurrent bronchitis at least twice a year.  Often in the winter.  Stressed the importance of maintenance inhaler use.  Still with relatively severe symptoms when she is ill with what sound like viral illnesses.  Trial of Trelegy not any better. Back on high dose Symbicort.  Intermittent exacerbations typically late in the year.  Discussed escalation back to Trelegy in the late summer, fall to get through mid winter then can always de-escalate if doing better.  She expressed understanding.  Will plan to do this at next visit.    Return in about 6 months (around 07/20/2024) for f/u Dr. Judeth Horn.   Karren Burly, MD 01/21/2024

## 2024-01-24 NOTE — Progress Notes (Signed)
 PATIENT: Shawna Gonzalez DOB: November 25, 1951  REASON FOR VISIT: follow up HISTORY FROM: patient PRIMARY NEUROLOGIST: Dr. Albertina Hugger  Chief Complaint  Patient presents with   RM 4    Patient is here alone for cpap follow-up. She states she has no known issues with her CPAP and she states her ESS is 0.      HISTORY OF PRESENT ILLNESS: Today 01/28/24:  Shawna Gonzalez is a 73 y.o. female with a history of OSA on CPAP. Returns today for follow-up.  She reports that the CPAP is doing well.  She denies any new issues.  She states that she gets anywhere from 4 to 7 hours of sleep at night.  Typically does not go to bed till 1 AM and gets up around 7 AM.  Her download is below     02/07/23: Shawna Gonzalez is a 73 y.o. female with a history of OSA on CPAP. Returns today for follow-up.  She reports that the CPAP works well.  She does not always get good sleep.  She states that she has a hard time shutting her brain off. Has tried melatonin but does not feel that it has worked.  She states that she does not always have a regular bedtime routine.  Tends to look at her phone computer or TV at bedtime.  Download is below      02/01/22: Shawna Gonzalez is a 73 year old female with a history of OSA on CPAP. She returns today for follow-up. CPAP is working well. Mask does make a mark on her face but doesn't want to change mask at this time.   REVIEW OF SYSTEMS: Out of a complete 14 system review of symptoms, the patient complains only of the following symptoms, and all other reviewed systems are negative.  ESS 0   ALLERGIES: Allergies  Allergen Reactions   Amlodipine Cough   Aspirin     Other Reaction(s): stopped 2018 due to gi  issues but not sure that stopping it made any real difference.   Bydureon [Exenatide] Itching and Other (See Comments)    KNOTS IN SKIN   Lisinopril Cough   Lunesta [Eszopiclone] Other (See Comments)    Metallic taste in mouth   Zoledronic  Acid     Other  Reaction(s): suspected ONJ issues 7/17   Sulfa Antibiotics Rash    HOME MEDICATIONS: Outpatient Medications Prior to Visit  Medication Sig Dispense Refill   albuterol  (PROVENTIL  HFA;VENTOLIN  HFA) 108 (90 BASE) MCG/ACT inhaler Inhale 2 puffs into the lungs every 6 (six) hours as needed for wheezing or shortness of breath.     budesonide -formoterol  (SYMBICORT ) 160-4.5 MCG/ACT inhaler Inhale 2 puffs into the lungs daily. 1 each 5   dicyclomine  (BENTYL ) 10 MG capsule TAKE 1 CAPSULE(10 MG) BY MOUTH FOUR TIMES DAILY BEFORE MEALS AND AT BEDTIME 90 capsule 6   dorzolamide  (TRUSOPT ) 2 % ophthalmic solution INSTILL 1 DROP INTO BOTH EYES THREE TIMES DAILY 30 mL 0   esomeprazole  (NEXIUM ) 40 MG capsule Take 1 capsule (40 mg total) by mouth 2 (two) times daily. 60 capsule 6   furosemide (LASIX) 40 MG tablet Take 40 mg by mouth every evening. Takes about 3 times per week  4   levocetirizine (XYZAL) 5 MG tablet Take 5 mg by mouth every evening.     losartan  (COZAAR ) 25 MG tablet Take 50 mg by mouth daily.     Multiple Vitamin (MULTIVITAMIN PO) Take by mouth. gummy  NEOMYCIN -POLYMYXIN-HYDROCORTISONE (CORTISPORIN) 1 % SOLN OTIC solution Apply 1-2 drops to toe BID after soaking 10 mL 1   Travoprost, BAK Free, (TRAVATAN) 0.004 % SOLN ophthalmic solution PLACE 1 DROP INTO BOTH EYES NIGHTLY     triamcinolone cream (KENALOG) 0.1 % as needed (rash).     TURMERIC PO Take by mouth. gummy     TYMLOS 3120 MCG/1.56ML SOPN Inject into the skin daily. Every 2 years DEXA     Vitamin D, Ergocalciferol, (DRISDOL) 50000 UNITS CAPS Take 50,000 Units by mouth 2 (two) times a week. Sunday & Thursday, 2-3 times per week     HYDROcodone bit-homatropine (HYCODAN) 5-1.5 MG/5ML syrup 5 mL as needed Orally every 6 hours for cough, caution on sedation for 10 days (Patient not taking: Reported on 01/28/2024)     traMADol  (ULTRAM ) 50 MG tablet Take 1 tablet (50 mg total) by mouth every 6 (six) hours as needed for moderate pain. (Patient  not taking: Reported on 01/28/2024) 30 tablet 0   No facility-administered medications prior to visit.    PAST MEDICAL HISTORY: Past Medical History:  Diagnosis Date   Adenomatous colon polyp    Anemia    history of   Arthritis    Asthma    Atypical chest pain    Bacterial ear infection 2018   Cancer (HCC)    melanoma skin cancer 7 years ago   Cataract    Diabetes mellitus without complication (HCC)    history of taking diabetic medication, has lost weight no longer needing medciation at this time   GERD (gastroesophageal reflux disease)    Glaucoma    Heart murmur    slight   Hypertension    Macular degeneration    Osteoporosis    Sleep apnea    cpap   Ulcerative colitis, chronic (HCC)     PAST SURGICAL HISTORY: Past Surgical History:  Procedure Laterality Date   CARPAL TUNNEL RELEASE Right 12/16/2009   CATARACT EXTRACTION W/ INTRAOCULAR LENS  IMPLANT, BILATERAL Bilateral 05/21/2007(Right); 06/04/2007(Left)   CHOLECYSTECTOMY  2000   COLONOSCOPY WITH PROPOFOL   01/23/2012   DE QUERVAIN'S RELEASE Right 11/30/2005   HYSTERECTOMY ABDOMINAL WITH SALPINGECTOMY Bilateral 01/09/2017   Procedure: HYSTERECTOMY ABDOMINAL WITH SALPINGOOPHORECTOMY;  Surgeon: Thurman Flores, MD;  Location: [facility];  Service: Gynecology;  Laterality: Bilateral;   TONSILLECTOMY     TRIGGER FINGER RELEASE Right    UPPER GI ENDOSCOPY  03/05/2017   MAC    FAMILY HISTORY: Family History  Problem Relation Age of Onset   Colon polyps Mother    Heart disease Mother    COPD Mother        early signs of   Colon polyps Father    Heart disease Father    Colon polyps Brother    Sleep apnea Nephew    Colon cancer Neg Hx    Esophageal cancer Neg Hx    Stomach cancer Neg Hx    Rectal cancer Neg Hx     SOCIAL HISTORY: Social History   Socioeconomic History   Marital status: Single    Spouse name: Not on file   Number of children: 0   Years of education: 16   Highest education level: Not on file   Occupational History   Occupation: customer service    Employer: [employer]  Tobacco Use   Smoking status: Never   Smokeless tobacco: Never  Vaping Use   Vaping status: Never Used  Substance and Sexual Activity   Alcohol use: No  Drug use: No   Sexual activity: Not on file  Other Topics Concern   Not on file  Social History Narrative   Lives alone    Caffeine use: Diet and caffeine free soda (1-2 drinks per day)         Epworth Sleepiness Scale = 7 (as of 01/26/2016)   Social Drivers of Corporate investment banker Strain: Not on file  Food Insecurity: Not on file  Transportation Needs: Not on file  Physical Activity: Not on file  Stress: Not on file  Social Connections: Not on file  Intimate Partner Violence: Not on file      PHYSICAL EXAM  Vitals:   01/28/24 1307  BP: 128/75  Pulse: 92  Weight: 194 lb (88 kg)  Height: 4\' 10"  (1.473 m)    Body mass index is 40.55 kg/m.  Generalized: Well developed, in no acute distress  Chest: Lungs clear to auscultation bilaterally  Neurological examination  Mentation: Alert oriented to time, place, history taking. Follows all commands speech and language fluent Cranial nerve II-XII: Head turning and shoulder shrug  were normal and symmetric. Motor: The motor testing reveals 5 over 5 strength of all 4 extremities. Good symmetric motor tone is noted throughout.  Sensory: Sensory testing is intact to soft touch on all 4 extremities. No evidence of extinction is noted.  Gait and station: Gait is normal.    DIAGNOSTIC DATA (LABS, IMAGING, TESTING) - I reviewed patient records, labs, notes, testing and imaging myself where available.  Lab Results  Component Value Date   WBC 8.7 08/15/2023   HGB 15.0 08/15/2023   HCT 45.6 08/15/2023   MCV 85.8 08/15/2023   PLT 221.0 08/15/2023      Component Value Date/Time   NA 137 08/15/2023 1411   NA 138 07/07/2022 1205   K 3.4 (L) 08/15/2023 1411   CL 98 08/15/2023 1411   CO2  29 08/15/2023 1411   GLUCOSE 93 08/15/2023 1411   BUN 14 08/15/2023 1411   BUN 13 07/07/2022 1205   CREATININE 0.63 08/15/2023 1411   CALCIUM 9.4 08/15/2023 1411   PROT 7.6 08/15/2023 1411   PROT 6.9 07/07/2022 1205   ALBUMIN 4.2 08/15/2023 1411   ALBUMIN 4.2 07/07/2022 1205   AST 18 08/15/2023 1411   ALT 17 08/15/2023 1411   ALKPHOS 129 (H) 08/15/2023 1411   BILITOT 1.0 08/15/2023 1411   BILITOT 0.4 07/07/2022 1205   GFRNONAA >60 01/09/2017 1137   GFRAA >60 01/09/2017 1137    Lab Results  Component Value Date   VITAMINB12 376 01/16/2007       ASSESSMENT AND PLAN 73 y.o. year old female  has a past medical history of Adenomatous colon polyp, Anemia, Arthritis, Asthma, Atypical chest pain, Bacterial ear infection (2018), Cancer (HCC), Cataract, Diabetes mellitus without complication (HCC), GERD (gastroesophageal reflux disease), Glaucoma, Heart murmur, Hypertension, Macular degeneration, Osteoporosis, Sleep apnea, and Ulcerative colitis, chronic (HCC). here with:  OSA on CPAP  - CPAP compliance excellent - Good treatment of AHI  - Encourage patient to use CPAP nightly and > 4 hours each night - F/U in 1 year or sooner if needed    Clem Currier, MSN, NP-C 01/28/2024, 1:22 PM [facility] [address], [address] [place], [place] [address] [phone]

## 2024-01-25 ENCOUNTER — Telehealth: Payer: Self-pay | Admitting: Gastroenterology

## 2024-01-25 NOTE — Telephone Encounter (Signed)
 Inbound call from patient requesting a call from nurse Beth, she states that she has been having " stomach issues" and is wanting to discuss. Please advise.

## 2024-01-28 ENCOUNTER — Encounter: Payer: Self-pay | Admitting: Adult Health

## 2024-01-28 ENCOUNTER — Ambulatory Visit: Payer: Medicare Other | Admitting: Adult Health

## 2024-01-28 ENCOUNTER — Other Ambulatory Visit: Payer: Self-pay

## 2024-01-28 VITALS — BP 128/75 | HR 92 | Ht <= 58 in | Wt 194.0 lb

## 2024-01-28 DIAGNOSIS — G4733 Obstructive sleep apnea (adult) (pediatric): Secondary | ICD-10-CM

## 2024-01-28 MED ORDER — ONDANSETRON 4 MG PO TBDP: 4.0000 mg | ORAL_TABLET | Freq: Every day | ORAL | 0 refills | Status: DC | PRN

## 2024-01-28 MED ORDER — FAMOTIDINE 20 MG PO TABS: 20.0000 mg | ORAL_TABLET | Freq: Every day | ORAL | 3 refills | Status: AC

## 2024-01-28 NOTE — Telephone Encounter (Signed)
 Please send Rx for Zofran  ODT 4mg  daily as needed for severe nausea, Rx 30 tabs with no refills. Add Pepcid  20mg  dailt at bedtime. Thanks

## 2024-01-28 NOTE — Telephone Encounter (Signed)
Called the patient. No answer. Left a message of my call.

## 2024-01-28 NOTE — Telephone Encounter (Signed)
 Patient advised.

## 2024-01-28 NOTE — Patient Instructions (Signed)
 Continue using CPAP nightly and greater than 4 hours each night If your symptoms worsen or you develop new symptoms please let us know.

## 2024-01-28 NOTE — Telephone Encounter (Signed)
 Patient reports a general burning, uncomfortable feeling in her upper epigastric area "like gastritis." She has some mild nausea. She was on antibiotics, prednisone  and cough medications in December. Her GI symptoms seemed to worsen during this time and have lingered on since then. Confirmed she takes Nexium  BID and uses Bentyl  PRN. She has not taken any OTC's for her symptoms.

## 2024-02-22 ENCOUNTER — Ambulatory Visit: Payer: Medicare Other | Attending: Internal Medicine | Admitting: Internal Medicine

## 2024-02-22 VITALS — BP 138/72 | HR 75 | Ht <= 58 in | Wt 194.0 lb

## 2024-02-22 DIAGNOSIS — R7303 Prediabetes: Secondary | ICD-10-CM | POA: Diagnosis not present

## 2024-02-22 DIAGNOSIS — I10 Essential (primary) hypertension: Secondary | ICD-10-CM | POA: Diagnosis not present

## 2024-02-22 DIAGNOSIS — E785 Hyperlipidemia, unspecified: Secondary | ICD-10-CM | POA: Diagnosis not present

## 2024-02-22 NOTE — Patient Instructions (Signed)
 Medication Instructions:  NO CHANGES  Lab Work: LIPID PANEL today    Follow-Up: At [facility], you and your health needs are our priority.  As part of our continuing mission to provide you with exceptional heart care, we have created designated Provider Care Teams.  These Care Teams include your primary Cardiologist (physician) and Advanced Practice Providers (APPs -  Physician Assistants and Nurse Practitioners) who all work together to provide you with the care you need, when you need it.  We recommend signing up for the patient portal called "MyChart".  Sign up information is provided on this After Visit Summary.  MyChart is used to connect with patients for Virtual Visits (Telemedicine).  Patients are able to view lab/test results, encounter notes, upcoming appointments, etc.  Non-urgent messages can be sent to your provider as well.   To learn more about what you can do with MyChart, go to [email or web address].    Your next appointment:   2 years with Dr. Rennis Golden   Other Instructions   1st Floor: - Lobby - Registration  - Pharmacy  - Lab - Cafe  2nd Floor: - PV Lab - Diagnostic Testing (echo, CT, nuclear med)  3rd Floor: - Vacant  4th Floor: - TCTS (cardiothoracic surgery) - AFib Clinic - Structural Heart Clinic - Vascular Surgery  - Vascular Ultrasound  5th Floor: - [facility] (general and EP) - Clinical Pharmacy for coumadin, hypertension, lipid, weight-loss medications, and med management appointments    Valet parking services will be available as well.

## 2024-02-22 NOTE — Progress Notes (Signed)
 OFFICE CONSULT NOTE  Chief Complaint:  Chest pain  Primary Care Physician: Rodrigo Ran, MD  HPI:  Shawna Gonzalez is a 73 y.o. female who is being seen today for the evaluation of chest pain at the request of Rodrigo Ran, MD.  This is a pleasant 73 year old female kindly referred by Dr. Waynard Edwards for evaluation and management of chest pain.  She had seen Dr. Caprice Kluver number of years ago and underwent stress testing in 2013 which was negative for ischemia.  She then saw Dr. Gery Pray in 2017 for what sounded like atypical chest pain.  He did not pursue any further ischemia evaluation.  She does have family history of heart disease including her mother who had had a heart attack in her 66s.  Her father also had some heart problems.  Other risk factors include hypertension and obesity.  She also has a type 2 diabetes which appears to be controlled with diet.  She is describing some recent onset chest discomfort.  More notably she has right anterior chest pain just to the right of midline in the sternum.  Sometimes it goes across her chest or comes up the sternum but is difficult for her to differentiate that between her reflux symptoms.  She says is not always worse with exertion or relieved by rest.  She does get some shortness of breath with exertion.  05/03/2021  [name] returns today for follow-up.  She underwent CT coronary angiography.  No coronary calcium with a normal aortic root diameter and normal right dominant coronary arteries.  There was hepatic steatosis.  Overall very reassuring finding.  I think a lot of her symptoms may be due to the significant stress she is under caring for her mother.  02/22/2024  [name] is seen today in follow-up.  Last saw her back in May 2023.  She had had a coronary CT angiogram which showed no coronary disease and no coronary calcium.  She has some atypical reflux symptoms but no anginal symptoms.  She had flu recently and saw her PCP  yesterday.  She noted that her A1c was up a little at 6.3% from 6.1.  She is working on dietary modification for this.  She has not had any repeat lipids since last summer.  Her target LDL is less than 70.  She is not on any lipid-lowering therapies.  She wants to try to avoid medications if possible.  PMHx:  Past Medical History:  Diagnosis Date   Adenomatous colon polyp    Anemia    history of   Arthritis    Asthma    Atypical chest pain    Bacterial ear infection 2018   Cancer (HCC)    melanoma skin cancer 7 years ago   Cataract    Diabetes mellitus without complication (HCC)    history of taking diabetic medication, has lost weight no longer needing medciation at this time   GERD (gastroesophageal reflux disease)    Glaucoma    Heart murmur    slight   Hypertension    Macular degeneration    Osteoporosis    Sleep apnea    cpap   Ulcerative colitis, chronic (HCC)     Past Surgical History:  Procedure Laterality Date   CARPAL TUNNEL RELEASE Right 12/16/2009   CATARACT EXTRACTION W/ INTRAOCULAR LENS  IMPLANT, BILATERAL Bilateral 05/21/2007(Right); 06/04/2007(Left)   CHOLECYSTECTOMY  2000   COLONOSCOPY WITH PROPOFOL  01/23/2012   DE QUERVAIN'S RELEASE Right 11/30/2005  HYSTERECTOMY ABDOMINAL WITH SALPINGECTOMY Bilateral 01/09/2017   Procedure: HYSTERECTOMY ABDOMINAL WITH SALPINGOOPHORECTOMY;  Surgeon: Marcelle Overlie, MD;  Location: [facility];  Service: Gynecology;  Laterality: Bilateral;   TONSILLECTOMY     TRIGGER FINGER RELEASE Right    UPPER GI ENDOSCOPY  03/05/2017   MAC    FAMHx:  Family History  Problem Relation Age of Onset   Colon polyps Mother    Heart disease Mother    COPD Mother        early signs of   Colon polyps Father    Heart disease Father    Colon polyps Brother    Sleep apnea Nephew    Colon cancer Neg Hx    Esophageal cancer Neg Hx    Stomach cancer Neg Hx    Rectal cancer Neg Hx     SOCHx:   reports that she has never smoked. She has  never used smokeless tobacco. She reports that she does not drink alcohol and does not use drugs.  ALLERGIES:  Allergies  Allergen Reactions   Amlodipine Cough   Aspirin     Other Reaction(s): stopped 2018 due to gi  issues but not sure that stopping it made any real difference.   Bydureon [Exenatide] Itching and Other (See Comments)    KNOTS IN SKIN   Lisinopril Cough   Lunesta [Eszopiclone] Other (See Comments)    Metallic taste in mouth   Zoledronic Acid     Other Reaction(s): suspected ONJ issues 7/17   Sulfa Antibiotics Rash    ROS: Pertinent items noted in HPI and remainder of comprehensive ROS otherwise negative.  HOME MEDS: Current Outpatient Medications on File Prior to Visit  Medication Sig Dispense Refill   albuterol (PROVENTIL HFA;VENTOLIN HFA) 108 (90 BASE) MCG/ACT inhaler Inhale 2 puffs into the lungs every 6 (six) hours as needed for wheezing or shortness of breath.     budesonide-formoterol (SYMBICORT) 160-4.5 MCG/ACT inhaler Inhale 2 puffs into the lungs daily. 1 each 5   dicyclomine (BENTYL) 10 MG capsule TAKE 1 CAPSULE(10 MG) BY MOUTH FOUR TIMES DAILY BEFORE MEALS AND AT BEDTIME 90 capsule 6   dorzolamide (TRUSOPT) 2 % ophthalmic solution INSTILL 1 DROP INTO BOTH EYES THREE TIMES DAILY 30 mL 0   esomeprazole (NEXIUM) 40 MG capsule Take 1 capsule (40 mg total) by mouth 2 (two) times daily. 60 capsule 6   famotidine (PEPCID) 20 MG tablet Take 1 tablet (20 mg total) by mouth at bedtime. 30 tablet 3   furosemide (LASIX) 40 MG tablet Take 40 mg by mouth every evening. Takes about 3 times per week  4   HYDROcodone bit-homatropine (HYCODAN) 5-1.5 MG/5ML syrup      levocetirizine (XYZAL) 5 MG tablet Take 5 mg by mouth every evening.     losartan (COZAAR) 25 MG tablet Take 50 mg by mouth daily.     Multiple Vitamin (MULTIVITAMIN PO) Take by mouth. gummy     NEOMYCIN-POLYMYXIN-HYDROCORTISONE (CORTISPORIN) 1 % SOLN OTIC solution Apply 1-2 drops to toe BID after soaking 10  mL 1   ondansetron (ZOFRAN-ODT) 4 MG disintegrating tablet Take 1 tablet (4 mg total) by mouth daily as needed for nausea or vomiting. 30 tablet 0   traMADol (ULTRAM) 50 MG tablet Take 1 tablet (50 mg total) by mouth every 6 (six) hours as needed for moderate pain. 30 tablet 0   Travoprost, BAK Free, (TRAVATAN) 0.004 % SOLN ophthalmic solution PLACE 1 DROP INTO BOTH EYES NIGHTLY  triamcinolone cream (KENALOG) 0.1 % as needed (rash).     TURMERIC PO Take by mouth. gummy     TYMLOS 3120 MCG/1.56ML SOPN Inject into the skin daily. Every 2 years DEXA     Vitamin D, Ergocalciferol, (DRISDOL) 50000 UNITS CAPS Take 50,000 Units by mouth 2 (two) times a week. Sunday & Thursday, 2-3 times per week     No current facility-administered medications on file prior to visit.    LABS/IMAGING: No results found for this or any previous visit (from the past 48 hours). No results found.  LIPID PANEL: No results found for: "CHOL", "TRIG", "HDL", "CHOLHDL", "VLDL", "LDLCALC", "LDLDIRECT"  LP(a): No results found for: "LIPOA"    WEIGHTS: Wt Readings from Last 3 Encounters:  02/22/24 194 lb (88 kg)  01/28/24 194 lb (88 kg)  01/21/24 195 lb (88.5 kg)    VITALS: BP 138/72 (BP Location: Left Arm, Patient Position: Sitting, Cuff Size: Normal)   Pulse 75   Ht 4\' 10"  (1.473 m)   Wt 194 lb (88 kg)   SpO2 99%   BMI 40.55 kg/m   EXAM: General appearance: alert, no distress, and morbidly obese Neck: no carotid bruit, no JVD, and thyroid not enlarged, symmetric, no tenderness/mass/nodules Lungs: clear to auscultation bilaterally Heart: regular rate and rhythm, S1, S2 normal, no murmur, click, rub or gallop Abdomen: soft, non-tender; bowel sounds normal; no masses,  no organomegaly Extremities: extremities normal, atraumatic, no cyanosis or edema Pulses: 2+ and symmetric Skin: Skin color, texture, turgor normal. No rashes or lesions Neurologic: Grossly normal Pleasant  EKG: EKG  Interpretation Date/Time:  Friday February 22 2024 09:46:30 EST Ventricular Rate:  75 PR Interval:  168 QRS Duration:  82 QT Interval:  392 QTC Calculation: 437 R Axis:   42  Text Interpretation: Normal sinus rhythm Normal ECG When compared with ECG of 16-Dec-2009 09:49, No significant change was found Confirmed by Zoila Shutter [phone]) on 02/22/2024 9:57:15 AM    ASSESSMENT: Atypical chest pain -0 coronary calcium and no detectable coronary disease by CT coronary angiography (03/22/2021) Family history of premature coronary disease Morbid obesity Prediabetes - A1c 6.3% Hypertension Hepatic steatosis  PLAN: 1.   Ms. Talwar has been pretty stable.  She had no coronary disease in 2022.  This is a slow process and unlikely she has developed any calcification.  Guidelines would not recommend reassessment for calcium until 7 to 10 years after the initial study.  Her A1c is trended up a little which she is working on with diet.  Blood pressure is top normal today but better controlled at home and was in the office yesterday.  Cholesterol has not been assessed since last summer.  Will repeat labs today since she is fasting.  She is not on any lipid-lowering therapy.  I would target her LDL to less than 100.  She had discussed lifestyle modification including weight loss and potentially the GLP-1 agonist with her PCP yesterday, but is not planning on starting them and will continue to work on diet and lifestyle.  She can follow-up with [place] in 2 years or sooner as necessary.  Chrystie Nose, MD, [facility], FACP  [place]  [facility]  Medical Director of the [facility] Lipid [facility] &  Cardiovascular Risk Reduction Clinic Diplomate of the American Board of Clinical Lipidology Attending Cardiologist  Direct Dial: [phone]  Fax: [phone]  Website:  [email or web address] 02/22/2024, 9:57 AM

## 2024-02-23 LAB — LIPID PANEL
Chol/HDL Ratio: 2.4 ratio (ref 0.0–4.4)
Cholesterol, Total: 184 mg/dL (ref 100–199)
HDL: 76 mg/dL (ref >39)
LDL Chol Calc (NIH): 93 mg/dL (ref 0–99)
Triglycerides: 85 mg/dL (ref 0–149)
VLDL Cholesterol Cal: 15 mg/dL (ref 5–40)

## 2024-03-04 ENCOUNTER — Telehealth: Payer: Self-pay | Admitting: Neurology

## 2024-03-04 ENCOUNTER — Other Ambulatory Visit: Payer: Self-pay | Admitting: Neurology

## 2024-03-04 DIAGNOSIS — G4733 Obstructive sleep apnea (adult) (pediatric): Secondary | ICD-10-CM

## 2024-03-04 NOTE — Telephone Encounter (Signed)
 Received a notification from [employer] which is who the patient has to get insurance auth through to get supplies filled. They are requesting last ov notes, updated order and SS. I have faxed to the number provided.

## 2024-04-21 ENCOUNTER — Telehealth: Payer: Self-pay | Admitting: Internal Medicine

## 2024-04-21 DIAGNOSIS — R001 Bradycardia, unspecified: Secondary | ICD-10-CM

## 2024-04-21 DIAGNOSIS — R42 Dizziness and giddiness: Secondary | ICD-10-CM

## 2024-04-21 NOTE — Telephone Encounter (Signed)
 Follow Up:     Patient is returning a call from today.

## 2024-04-21 NOTE — Telephone Encounter (Signed)
 Pt called to report that she has had been getting low HR readings on her watch and pulse ox for about 6 weeks... HR ranging during the day 48-55... normal for her 65-70. She has not had any SOB but some light headedness and fatigue on and off.... she will continue to monitor.... she does not know her BP...she is in [place] until next Tuesday.,.. just wanted to let Dr Maximo Spar know. Will forward to him for his review.

## 2024-04-21 NOTE — Telephone Encounter (Signed)
 STAT if HR is under 50 or over 120 (normal HR is 60-100 beats per minute)  What is your heart rate? Resting HR 50-55  Do you have a log of your heart rate readings (document readings)? 48, but normally it's in the range of 50-55 when she is rest.    Do you have any other symptoms? Sometimes she is a little on the dizzy side and fatigued.   Patient called because she is concerned about he resting heart rate.

## 2024-04-21 NOTE — Telephone Encounter (Signed)
 Called pt in regards to HR reports is currently in a restaurant and will call our office back.

## 2024-04-21 NOTE — Telephone Encounter (Signed)
 Left a message for the pt to call back.

## 2024-04-22 NOTE — Telephone Encounter (Signed)
 MyChart message sent to patient.

## 2024-04-22 NOTE — Telephone Encounter (Signed)
  We may need to place a monitor when she returns - she is not on any meds that should lower heartrate.  Dr Marvina Slough

## 2024-04-30 ENCOUNTER — Ambulatory Visit: Attending: Internal Medicine

## 2024-04-30 DIAGNOSIS — R42 Dizziness and giddiness: Secondary | ICD-10-CM

## 2024-04-30 DIAGNOSIS — R001 Bradycardia, unspecified: Secondary | ICD-10-CM

## 2024-04-30 NOTE — Progress Notes (Unsigned)
 Enrolled for [employer] to mail a ZIO XT long term holter monitor to the patients address on file.

## 2024-04-30 NOTE — Telephone Encounter (Signed)
 14 day zio ordered per [facility] MD

## 2024-04-30 NOTE — Telephone Encounter (Signed)
 Spoke with patient. She just returned home last night - has not checked heart rate. Reports intermittent dizzy spells. She is interested in monitor. Will route to MD to determine length of monitoring period and then order will be placed.

## 2024-05-05 ENCOUNTER — Other Ambulatory Visit: Payer: Self-pay | Admitting: Gastroenterology

## 2024-05-26 ENCOUNTER — Telehealth: Payer: Self-pay | Admitting: Adult Health

## 2024-05-26 NOTE — Telephone Encounter (Signed)
 Samuel Crock from [facility] ) called  stating they are waiting for forms to be faxed over for PT CPAP supplies . Forms were faxed out 05-22-24  [facility] he[facility]th  also will need last office note with [facility] faxed as well  [facility]  Phone #[phone] Faxed #[phone]

## 2024-05-26 NOTE — Telephone Encounter (Signed)
 I do not see a fax here at this time. I have reached out to [employer] as they are to be the primary contact for [employer].

## 2024-05-27 NOTE — Telephone Encounter (Signed)
 New, Edd Gong, Jodeen Munch, RN; Larinda Plover; Tucker, Dolanda; Adams, Melissa L; 1 other Received, Thank you!

## 2024-06-02 DIAGNOSIS — R001 Bradycardia, unspecified: Secondary | ICD-10-CM | POA: Diagnosis not present

## 2024-06-02 DIAGNOSIS — R42 Dizziness and giddiness: Secondary | ICD-10-CM | POA: Diagnosis not present

## 2024-06-04 ENCOUNTER — Ambulatory Visit: Payer: Self-pay | Admitting: Internal Medicine

## 2024-07-20 ENCOUNTER — Other Ambulatory Visit: Payer: Self-pay | Admitting: Gastroenterology

## 2024-08-21 ENCOUNTER — Ambulatory Visit: Admitting: Pulmonary Disease

## 2024-08-21 ENCOUNTER — Encounter: Payer: Self-pay | Admitting: Pulmonary Disease

## 2024-08-21 VITALS — BP 151/86 | HR 86 | Temp 98.5°F | Ht <= 58 in | Wt 183.4 lb

## 2024-08-21 DIAGNOSIS — K449 Diaphragmatic hernia without obstruction or gangrene: Secondary | ICD-10-CM

## 2024-08-21 DIAGNOSIS — R0609 Other forms of dyspnea: Secondary | ICD-10-CM

## 2024-08-21 DIAGNOSIS — J45909 Unspecified asthma, uncomplicated: Secondary | ICD-10-CM | POA: Diagnosis not present

## 2024-08-21 DIAGNOSIS — G4734 Idiopathic sleep related nonobstructive alveolar hypoventilation: Secondary | ICD-10-CM

## 2024-08-21 DIAGNOSIS — J4540 Moderate persistent asthma, uncomplicated: Secondary | ICD-10-CM

## 2024-08-21 DIAGNOSIS — G4733 Obstructive sleep apnea (adult) (pediatric): Secondary | ICD-10-CM

## 2024-08-21 NOTE — Progress Notes (Signed)
 @Patient  ID: Shawna Gonzalez, female    DOB: 07/03/1951, 73 y.o.   MRN: [id number]  No chief complaint on file.   Referring provider: Shayne Anes, MD  HPI:   73 y.o. woman with asthma whom we are seeing in follow up for evaluation of cough, recurrent bronchitis.  Most recent PCP note reviewed.  Returns for routine follow-up.  Doing well.  No complaints.  Upcoming trips.  Sounds fine.  Discussed escalating to triple inhaled therapy during the winter months given recurrent exacerbations typically November through December.  Will try with low dose Trelegy to replace Symbicort  during these months.  She expressed understanding.  HPI at initial visit: Overall, doing well.  She states she checks her oxygen at home.  Usually 96 to 98%.  In the past when she had pneumonia is never dropped below 88%.  89% at 1 point time in the hospital.  She does describe dyspnea on exertion.  Comes and goes.  Worse on inclines or stairs.  No time of day when things are better or worse.  No seasonal environmental factors she can identify that make things better or worse.  No position make things better or worse.  Sometimes relieved with albuterol .  Use albuterol  and Symbicort  as needed at this point.  Do think they are a bit beneficial when she is feeling short of breath.  Sensation at times at rest with difficulty getting a full breath and a deep breath in.  She has history of asthma.  [place] seems recurrent bronchitis.  Cough for many weeks.  Was worse when she was working but retired a couple years ago from [employer].  But does tend to have them at least once a year now.  1-2 times yearly prior.  Usually between Thanksgiving and New Year's.  Again, she does not use maintenance inhaler regularly.  Most recent chest imaging CT coronary scan 03/2021 with clear lungs bilaterally on my review interpretation of available lung fields.  PMH: Asthma, GERD, Surgical history: Carpal tunnel surgery, cataract surgery,  cholecystectomy, tonsillectomy Family history: Mother with lung cancer, COPD, CAD, father with CAD Social history: Never smoker, lives in [place] / Pulmonary Flowsheets:   ACT:      No data to display          MMRC:     No data to display          Epworth:      No data to display          Tests:   FENO:  No results found for: NITRICOXIDE  PFT:    Latest Ref Rng & Units 08/16/2022    2:59 PM  PFT Results  FVC-Pre L 1.71   FVC-Predicted Pre % 78   FVC-Post L 1.70   FVC-Predicted Post % 77   Pre FEV1/FVC % % 89   Post FEV1/FCV % % 94   FEV1-Pre L 1.52   FEV1-Predicted Pre % 92   FEV1-Post L 1.60   DLCO uncorrected ml/min/mmHg 14.65   DLCO UNC% % 96   DLCO corrected ml/min/mmHg 14.39   DLCO COR %Predicted % 95   DLVA Predicted % 108   TLC L 3.48   TLC % Predicted % 86   RV % Predicted % 53   Personally reviewed interpreted normal spirometry, no bronchodilator response, lung volumes within normal limits with exception of severely reduced ERV, DLCO within normal limits  WALK:      No data to display  Imaging: Personally reviewed and as per EMR discussion in this note No results found.  Lab Results: Personally reviewed CBC    Component Value Date/Time   WBC 8.7 08/15/2023 1411   RBC 5.32 (H) 08/15/2023 1411   HGB 15.0 08/15/2023 1411   HGB 14.0 07/07/2022 1205   HCT 45.6 08/15/2023 1411   HCT 40.9 07/07/2022 1205   PLT 221.0 08/15/2023 1411   PLT 251 07/07/2022 1205   MCV 85.8 08/15/2023 1411   MCV 80 07/07/2022 1205   MCH 27.5 07/07/2022 1205   MCH 27.9 01/10/2017 0514   MCHC 32.9 08/15/2023 1411   RDW 15.4 08/15/2023 1411   RDW 14.9 07/07/2022 1205   LYMPHSABS 2.4 08/15/2023 1411   MONOABS 0.6 08/15/2023 1411   EOSABS 0.3 08/15/2023 1411   BASOSABS 0.1 08/15/2023 1411    BMET    Component Value Date/Time   NA 137 08/15/2023 1411   NA 138 07/07/2022 1205   K 3.4 (L) 08/15/2023 1411   CL 98  08/15/2023 1411   CO2 29 08/15/2023 1411   GLUCOSE 93 08/15/2023 1411   BUN 14 08/15/2023 1411   BUN 13 07/07/2022 1205   CREATININE 0.63 08/15/2023 1411   CALCIUM 9.4 08/15/2023 1411   GFRNONAA >60 01/09/2017 1137   GFRAA >60 01/09/2017 1137    BNP No results found for: BNP  ProBNP No results found for: PROBNP  Specialty Problems       Pulmonary Problems   Diaphragmatic hernia   Qualifier: Diagnosis of  By: Drucilla BANANA, Pam   IMO SNOMED Dx Update Oct 2024      Chronic cough   Asthma, chronic   Respiratory failure with hypoxia and hypercapnia (HCC)   OSA on CPAP   Nocturnal hypoxemia   Obesity hypoventilation syndrome (HCC)    Allergies  Allergen Reactions   Amlodipine Cough   Aspirin     Other Reaction(s): stopped 2018 due to gi  issues but not sure that stopping it made any real difference.   Bydureon [Exenatide] Itching and Other (See Comments)    KNOTS IN SKIN   Lisinopril Cough   Lunesta [Eszopiclone] Other (See Comments)    Metallic taste in mouth   Zoledronic  Acid     Other Reaction(s): suspected ONJ issues 7/17   Sulfa Antibiotics Rash    Immunization History  Administered Date(s) Administered   Influenza Whole 09/17/2012   Influenza,inj,quad, With Preservative 11/21/2017, 11/09/2020   Influenza-Unspecified 12/22/2016   Pneumococcal Polysaccharide-23 09/18/2007    Past Medical History:  Diagnosis Date   Adenomatous colon polyp    Anemia    history of   Arthritis    Asthma    Atypical chest pain    Bacterial ear infection 2018   Cancer (HCC)    melanoma skin cancer 7 years ago   Cataract    Diabetes mellitus without complication (HCC)    history of taking diabetic medication, has lost weight no longer needing medciation at this time   GERD (gastroesophageal reflux disease)    Glaucoma    Heart murmur    slight   Hypertension    Macular degeneration    Osteoporosis    Sleep apnea    cpap   Ulcerative colitis, chronic (HCC)      Tobacco History: Social History   Tobacco Use  Smoking Status Never  Smokeless Tobacco Never   Counseling given: Not Answered   Continue to not smoke  Outpatient Encounter Medications as of 08/21/2024  Medication Sig   albuterol  (PROVENTIL  HFA;VENTOLIN  HFA) 108 (90 BASE) MCG/ACT inhaler Inhale 2 puffs into the lungs every 6 (six) hours as needed for wheezing or shortness of breath.   budesonide -formoterol  (SYMBICORT ) 160-4.5 MCG/ACT inhaler Inhale 2 puffs into the lungs daily.   dicyclomine  (BENTYL ) 10 MG capsule TAKE 1 CAPSULE(10 MG) BY MOUTH FOUR TIMES DAILY BEFORE MEALS AND AT BEDTIME   dorzolamide  (TRUSOPT ) 2 % ophthalmic solution INSTILL 1 DROP INTO BOTH EYES THREE TIMES DAILY   esomeprazole  (NEXIUM ) 40 MG capsule Take 1 capsule (40 mg total) by mouth 2 (two) times daily.   famotidine  (PEPCID ) 20 MG tablet Take 1 tablet (20 mg total) by mouth at bedtime.   furosemide (LASIX) 40 MG tablet Take 40 mg by mouth every evening. Takes about 3 times per week   HYDROcodone bit-homatropine (HYCODAN) 5-1.5 MG/5ML syrup    levocetirizine (XYZAL) 5 MG tablet Take 5 mg by mouth every evening.   losartan  (COZAAR ) 25 MG tablet Take 50 mg by mouth daily.   Multiple Vitamin (MULTIVITAMIN PO) Take by mouth. gummy   NEOMYCIN -POLYMYXIN-HYDROCORTISONE (CORTISPORIN) 1 % SOLN OTIC solution Apply 1-2 drops to toe BID after soaking   ondansetron  (ZOFRAN -ODT) 4 MG disintegrating tablet DISSOLVE 1 TABLET(4 MG) ON THE TONGUE DAILY AS NEEDED FOR NAUSEA OR VOMITING   traMADol  (ULTRAM ) 50 MG tablet Take 1 tablet (50 mg total) by mouth every 6 (six) hours as needed for moderate pain.   Travoprost, BAK Free, (TRAVATAN) 0.004 % SOLN ophthalmic solution PLACE 1 DROP INTO BOTH EYES NIGHTLY   triamcinolone cream (KENALOG) 0.1 % as needed (rash).   TURMERIC PO Take by mouth. gummy   TYMLOS 3120 MCG/1.56ML SOPN Inject into the skin daily. Every 2 years DEXA   Vitamin D, Ergocalciferol, (DRISDOL) 50000 UNITS CAPS  Take 50,000 Units by mouth 2 (two) times a week. Sunday & Thursday, 2-3 times per week   No facility-administered encounter medications on file as of 08/21/2024.     Review of Systems  Review of Systems  N/a Physical Exam  BP (!) 151/86   Pulse 86   Temp 98.5 F (36.9 C) (Oral)   Ht 4' 9 (1.448 m)   Wt 183 lb 6.4 oz (83.2 kg)   SpO2 98%   BMI 39.69 kg/m   Wt Readings from Last 5 Encounters:  08/21/24 183 lb 6.4 oz (83.2 kg)  02/22/24 194 lb (88 kg)  01/28/24 194 lb (88 kg)  01/21/24 195 lb (88.5 kg)  11/01/23 183 lb (83 kg)    BMI Readings from Last 5 Encounters:  08/21/24 39.69 kg/m  02/22/24 40.55 kg/m  01/28/24 40.55 kg/m  01/21/24 40.76 kg/m  11/01/23 38.25 kg/m     Physical Exam General: Sitting in chair, no acute distress Eyes: EOMI, no icterus Neck: Supple, no JVP appreciated Pulmonary: Clear, normal work of breathing Cardiovascular warm, no edema Abdomen: Nondistended MSK: No synovitis, no joint effusion Neuro: Normal gait, no weakness Psych: Normal mood, full affect   Assessment & Plan:   Dyspnea on exertion: Suspect multifactorial.  Possibly poorly controlled asthma given her recurrent episodes of bronchitis to the year.  Also concern for weight and deconditioning contributing.  Cardiology evaluation in the past has been reassuring.  PFTs within normal limits 07/2022.  Asthma: Day-to-day seems fairly well controlled but overall think poorly controlled given bouts of recurrent bronchitis at least twice a year.  Often in the winter.  Stressed the importance of maintenance inhaler use.  Still with relatively  severe symptoms when she is ill with what sound like viral illnesses.  Trial of Trelegy not any better. Back on high dose Symbicort .  Intermittent exacerbations typically late in the year.  Continue Symbicort  now, we will try but there is Trelegy starting November for 2 months and assess response.   No follow-ups on file.   Shawna JONELLE Beals, MD 08/21/2024

## 2024-08-21 NOTE — Patient Instructions (Signed)
 Nice to see you again  Continue with the Symbicort  for now  In November, start using the Trelegy.  1 puff once a day.  I provided samples, samples will last 2-week each, for samples, 8 weeks total.  Enjoy your trips  Return to clinic in 4 months or sooner as needed with Dr. Annella

## 2024-11-06 ENCOUNTER — Ambulatory Visit (HOSPITAL_BASED_OUTPATIENT_CLINIC_OR_DEPARTMENT_OTHER): Admitting: Physical Therapy

## 2024-11-11 ENCOUNTER — Encounter (HOSPITAL_BASED_OUTPATIENT_CLINIC_OR_DEPARTMENT_OTHER): Payer: Self-pay | Admitting: Physical Therapy

## 2024-11-11 ENCOUNTER — Other Ambulatory Visit: Payer: Self-pay

## 2024-11-11 ENCOUNTER — Ambulatory Visit (HOSPITAL_BASED_OUTPATIENT_CLINIC_OR_DEPARTMENT_OTHER): Attending: Sports Medicine | Admitting: Physical Therapy

## 2024-11-11 DIAGNOSIS — R29898 Other symptoms and signs involving the musculoskeletal system: Secondary | ICD-10-CM | POA: Insufficient documentation

## 2024-11-11 DIAGNOSIS — M6281 Muscle weakness (generalized): Secondary | ICD-10-CM | POA: Diagnosis present

## 2024-11-11 DIAGNOSIS — M25561 Pain in right knee: Secondary | ICD-10-CM | POA: Diagnosis present

## 2024-11-11 DIAGNOSIS — M25662 Stiffness of left knee, not elsewhere classified: Secondary | ICD-10-CM | POA: Diagnosis present

## 2024-11-11 DIAGNOSIS — G8929 Other chronic pain: Secondary | ICD-10-CM | POA: Diagnosis present

## 2024-11-11 NOTE — Therapy (Signed)
 OUTPATIENT PHYSICAL THERAPY LOWER EXTREMITY EVALUATION   Patient Name: Shawna Gonzalez MRN: [id number] DOB:[birth year], 73 y.o., female Today's Date: 11/11/2024  END OF SESSION:  PT End of Session - 11/11/24 1608     Visit Number 1    Number of Visits 17    Date for Recertification  01/06/25    Authorization Type UHC MCR    Authorization Time Period auth TBD    Progress Note Due on Visit 10    PT Start Time 1603    PT Stop Time 1641    PT Time Calculation (min) 38 min    Activity Tolerance Patient tolerated treatment well    Behavior During Therapy WFL for tasks assessed/performed          Past Medical History:  Diagnosis Date   Adenomatous colon polyp    Anemia    history of   Arthritis    Asthma    Atypical chest pain    Bacterial ear infection 2018   Cancer (HCC)    melanoma skin cancer 7 years ago   Cataract    Diabetes mellitus without complication (HCC)    history of taking diabetic medication, has lost weight no longer needing medciation at this time   GERD (gastroesophageal reflux disease)    Glaucoma    Heart murmur    slight   Hypertension    Macular degeneration    Osteoporosis    Sleep apnea    cpap   Ulcerative colitis, chronic (HCC)    Past Surgical History:  Procedure Laterality Date   CARPAL TUNNEL RELEASE Right 12/16/2009   CATARACT EXTRACTION W/ INTRAOCULAR LENS  IMPLANT, BILATERAL Bilateral 05/21/2007(Right); 06/04/2007(Left)   CHOLECYSTECTOMY  2000   COLONOSCOPY WITH PROPOFOL   01/23/2012   DE QUERVAIN'S RELEASE Right 11/30/2005   HYSTERECTOMY ABDOMINAL WITH SALPINGECTOMY Bilateral 01/09/2017   Procedure: HYSTERECTOMY ABDOMINAL WITH SALPINGOOPHORECTOMY;  Surgeon: Rosaline Cobble, MD;  Location: [facility];  Service: Gynecology;  Laterality: Bilateral;   TONSILLECTOMY     TRIGGER FINGER RELEASE Right    UPPER GI ENDOSCOPY  03/05/2017   MAC   Patient Active Problem List   Diagnosis Date Noted   Obesity hypoventilation syndrome (HCC)  02/01/2021   Morbid obesity with BMI of 40.0-44.9, adult (HCC) 02/01/2021   Displacement of lumbar intervertebral disc without myelopathy 05/19/2019   Lumbosacral spondylosis without myelopathy 05/05/2019   Trochanteric bursitis of left hip 04/02/2019   Pain of left hip joint 04/01/2019   Adhesive capsulitis of left shoulder 03/12/2019   Pain of left hand 04/23/2018   Low back pain 02/06/2018   Nocturnal hypoxemia 01/24/2018   Insomnia secondary to chronic pain 01/24/2018   OAG (open angle glaucoma) suspect, high risk, bilateral 01/03/2018   Abdominal pain, epigastric 02/28/2017   S/P TAH-BSO (total abdominal hysterectomy and bilateral salpingo-oophorectomy) 01/09/2017   Bloating 10/12/2016   Generalized abdominal pain 10/12/2016   Nausea without vomiting 10/12/2016   Essential hypertension 01/26/2016   Atypical chest pain 01/26/2016   OSA on CPAP 01/18/2016   Hypoxemia 01/18/2016   Exudative age-related macular degeneration, left eye, with active choroidal neovascularization (HCC) 10/11/2015   Cystoid macular edema of left eye 09/09/2015   Fundus coloboma, bilateral 09/09/2015   Asthma, chronic 08/04/2015   Obesity, morbid (HCC) 08/04/2015   Respiratory failure with hypoxia and hypercapnia (HCC) 08/04/2015   Chronic cough 05/22/2012   ULCERATIVE COLITIS, UNIVERSAL 04/10/2008   ANEMIA, IRON DEFICIENCY, HX OF 04/10/2008   Diaphragmatic hernia 05/16/2007    PCP:  Shayne Anes, MD   REFERRING PROVIDER: Arnaldo Juliene RAMAN, MD  REFERRING DIAG: [phone] (ICD-10-CM) - Pain in right knee  THERAPY DIAG:  Chronic pain of right knee  Stiffness of left knee, not elsewhere classified  Muscle weakness (generalized)  Other symptoms and signs involving the musculoskeletal system  Rationale for Evaluation and Treatment: Rehabilitation  ONSET DATE: chronic   SUBJECTIVE:   SUBJECTIVE STATEMENT: Went to [place] and [place] about 23 days ago (walked 24K steps a day), had shoulder pain  before the trip, knee is not feeling good now and had a fall last night too getting my foot caught on something in the kitchen. Usually end up falling on the knee. Have severe osteoporosis, have been getting injections for awhile.   PERTINENT HISTORY: See above  PAIN:  Are you having pain? Yes: NPRS scale: 1-2/10  Pain location: R knee  Pain description: its there  Aggravating factors: falling on it, moving knee after its been stiff for awhile   Relieving factors: movement   PRECAUTIONS: Fall and Other: legally blind   RED FLAGS: None   WEIGHT BEARING RESTRICTIONS: No  FALLS:  Has patient fallen in last 6 months? Yes. Number of falls 3, FOF (+)  LIVING ENVIRONMENT: Lives with: lives alone Lives in: House/apartment   OCCUPATION: retired  PLOF: Independent, Independent with basic ADLs, Independent with gait, and Independent with transfers  PATIENT GOALS: less knee stiffness, less night pain, stay mobile   NEXT MD VISIT: PRN with referring   OBJECTIVE:  Note: Objective measures were completed at Evaluation unless otherwise noted.   PATIENT SURVEYS:  PSFS: THE PATIENT SPECIFIC FUNCTIONAL SCALE  Place score of 0-10 (0 = unable to perform activity and 10 = able to perform activity at the same level as before injury or problem)  Activity Date: 11/11/24 Eval     Being in one position for a long time  3    2. Steps  5    3.     4.      Total Score 4      Total Score = Sum of activity scores/number of activities  Minimally Detectable Change: 3 points (for single activity); 2 points (for average score)  [facility] (nd). The Patient Specific Functional Scale . Retrieved from [email or web address]   COGNITION: Overall cognitive status: Within functional limits for tasks assessed      MUSCLE LENGTH:  B patellas hypomobile, L>R  HS WNL Piriformis 40% limitation B       LOWER EXTREMITY  ROM:  Active ROM Right eval Left eval  Hip flexion    Hip extension    Hip abduction    Hip adduction    Hip internal rotation    Hip external rotation    Knee flexion -2* 3*  Knee extension 122* 108*  Ankle dorsiflexion    Ankle plantarflexion    Ankle inversion    Ankle eversion     (Blank rows = not tested)  LOWER EXTREMITY MMT:  MMT Right eval Left eval  Hip flexion 3 3  Hip extension    Hip abduction 3 3  Hip adduction    Hip internal rotation    Hip external rotation    Knee flexion 4+ 4+  Knee extension 4 4-  Ankle dorsiflexion    Ankle plantarflexion    Ankle inversion    Ankle eversion     (Blank rows = not tested)  TREATMENT DATE:   11/11/24  Eval POC HEP      PATIENT EDUCATION:  Education details: exam findings, POC, HEP  Person educated: Patient Education method: Explanation, Demonstration, and Handouts Education comprehension: verbalized understanding and returned demonstration  HOME EXERCISE PROGRAM: Access Code: [email or web address] URL: [email or web address] Date: 11/11/2024 Prepared by: Josette Rough  Exercises - Supine Active Straight Leg Raise  - 1 x daily - 5 x weekly - 2 sets - 10 reps - Supine Bridge with Resistance Band  - 1 x daily - 5 x weekly - 2 sets - 5-10 reps - Seated Knee Extension with Anchored Resistance  - 1 x daily - 5 x weekly - 2 sets - 10 reps  ASSESSMENT:  CLINICAL IMPRESSION:  Patient is a 73 y.o. F who was seen today for physical therapy evaluation and treatment for M25.561 (ICD-10-CM) - Pain in right knee. Objectives as above, will make every effort to improve level of function and pain.   OBJECTIVE IMPAIRMENTS: Abnormal gait, decreased activity tolerance, decreased balance, decreased mobility, difficulty walking, decreased ROM, decreased strength, increased fascial  restrictions, obesity, and pain.   ACTIVITY LIMITATIONS: standing, squatting, sleeping, stairs, transfers, and locomotion level  PARTICIPATION LIMITATIONS: meal prep, cleaning, laundry, shopping, community activity, and yard work  PERSONAL FACTORS: Age, Education, Fitness, Past/current experiences, Social background, and Time since onset of injury/illness/exacerbation are also affecting patient's functional outcome.   REHAB POTENTIAL: Good  CLINICAL DECISION MAKING: Stable/uncomplicated  EVALUATION COMPLEXITY: Low   GOALS: Goals reviewed with patient? No  SHORT TERM GOALS: Target date: 12/09/2024    Will be compliant with appropriate progressive HEP Goal status: initial    2. L knee AROM flexion to match that of the R knee without pain  Goal status: initial   3. Sx and pain to have improved by at least 25% Goal status: initial    LONG TERM GOALS: Target date: 01/06/2025    MMT to have improved by one grade all weak groups Goal status: initial   2. Will score at least 20 on the DGI to show reduced fall risk Goal status: initial    3. Will be able to ascend and descend stairs reciprocally with no increase in pain Goal status: initial   4. Will be able to sleep through the night without increased knee pain upon waking Goal status: lniital   5. PSFS  to have improved by at least 3  points to show improved QOL and subjective improvement     PLAN:  PT FREQUENCY: 2x/week  PT DURATION: 8 weeks  PLANNED INTERVENTIONS: 97164- PT Re-evaluation, 97750- Physical Performance Testing, 97110-Therapeutic exercises, 97530- Therapeutic activity, [id number]- Neuromuscular re-education, 97535- Self Care, [id number]- Manual therapy, [id number]- Gait training, and [phone]- Aquatic Therapy  PLAN FOR NEXT SESSION: functional strength and balance, L knee ROM   Josette Rough, PT, DPT 11/11/24 4:45 PM     Date of referral: 09/19/24 Referring provider: Arnaldo Juliene RAMAN, MD Referring  diagnosis? M25.561 (ICD-10-CM) - Pain in right knee Treatment diagnosis? (if different than referring diagnosis)   M25.561, G89.29, M25.662, M62.81, R29.898  What was this (referring dx) caused by? Ongoing Issue  Lysle of Condition: Chronic (continuous duration > 3 months)   Laterality: Rt  Current Functional Measure Score: Patient Specific Functional Scale 4  Objective measurements identify impairments when they are compared to normal values, the uninvolved extremity, and prior level of function.  [x]  Yes  []  No  Objective assessment of functional ability: Minimal functional limitations   Briefly describe  symptoms: ongoing chronic pain in R knee   How did symptoms start: over time   Average pain intensity:  Last 24 hours: 4/10  Past week: 4/10  How often does the pt experience symptoms? Frequently  How much have the symptoms interfered with usual daily activities? A little bit  How has condition changed since care began at this facility? NA - initial visit  In general, how is the patients overall health? Very Good   BACK PAIN (STarT Back Screening Tool) No

## 2024-11-17 ENCOUNTER — Ambulatory Visit (HOSPITAL_BASED_OUTPATIENT_CLINIC_OR_DEPARTMENT_OTHER)

## 2024-11-17 DIAGNOSIS — M6281 Muscle weakness (generalized): Secondary | ICD-10-CM | POA: Diagnosis present

## 2024-11-17 DIAGNOSIS — M25662 Stiffness of left knee, not elsewhere classified: Secondary | ICD-10-CM | POA: Insufficient documentation

## 2024-11-17 DIAGNOSIS — R29898 Other symptoms and signs involving the musculoskeletal system: Secondary | ICD-10-CM | POA: Insufficient documentation

## 2024-11-17 DIAGNOSIS — M25561 Pain in right knee: Secondary | ICD-10-CM | POA: Diagnosis present

## 2024-11-17 DIAGNOSIS — G8929 Other chronic pain: Secondary | ICD-10-CM | POA: Insufficient documentation

## 2024-11-17 NOTE — Therapy (Signed)
 OUTPATIENT PHYSICAL THERAPY LOWER EXTREMITY EVALUATION   Patient Name: Shawna Gonzalez MRN: [id number] DOB:[birth year], 73 y.o., female Today's Date: 11/17/2024  END OF SESSION:  PT End of Session - 11/17/24 1306     Visit Number 2    Number of Visits 17    Date for Recertification  01/06/25    Authorization Type UHC MCR    Authorization Time Period auth TBD    Progress Note Due on Visit 10    PT Start Time 1303    PT Stop Time 1345    PT Time Calculation (min) 42 min    Activity Tolerance Patient tolerated treatment well    Behavior During Therapy WFL for tasks assessed/performed           Past Medical History:  Diagnosis Date   Adenomatous colon polyp    Anemia    history of   Arthritis    Asthma    Atypical chest pain    Bacterial ear infection 2018   Cancer (HCC)    melanoma skin cancer 7 years ago   Cataract    Diabetes mellitus without complication (HCC)    history of taking diabetic medication, has lost weight no longer needing medciation at this time   GERD (gastroesophageal reflux disease)    Glaucoma    Heart murmur    slight   Hypertension    Macular degeneration    Osteoporosis    Sleep apnea    cpap   Ulcerative colitis, chronic (HCC)    Past Surgical History:  Procedure Laterality Date   CARPAL TUNNEL RELEASE Right 12/16/2009   CATARACT EXTRACTION W/ INTRAOCULAR LENS  IMPLANT, BILATERAL Bilateral 05/21/2007(Right); 06/04/2007(Left)   CHOLECYSTECTOMY  2000   COLONOSCOPY WITH PROPOFOL   01/23/2012   DE QUERVAIN'S RELEASE Right 11/30/2005   HYSTERECTOMY ABDOMINAL WITH SALPINGECTOMY Bilateral 01/09/2017   Procedure: HYSTERECTOMY ABDOMINAL WITH SALPINGOOPHORECTOMY;  Surgeon: Rosaline Cobble, MD;  Location: [facility];  Service: Gynecology;  Laterality: Bilateral;   TONSILLECTOMY     TRIGGER FINGER RELEASE Right    UPPER GI ENDOSCOPY  03/05/2017   MAC   Patient Active Problem List   Diagnosis Date Noted   Obesity hypoventilation syndrome (HCC)  02/01/2021   Morbid obesity with BMI of 40.0-44.9, adult (HCC) 02/01/2021   Displacement of lumbar intervertebral disc without myelopathy 05/19/2019   Lumbosacral spondylosis without myelopathy 05/05/2019   Trochanteric bursitis of left hip 04/02/2019   Pain of left hip joint 04/01/2019   Adhesive capsulitis of left shoulder 03/12/2019   Pain of left hand 04/23/2018   Low back pain 02/06/2018   Nocturnal hypoxemia 01/24/2018   Insomnia secondary to chronic pain 01/24/2018   OAG (open angle glaucoma) suspect, high risk, bilateral 01/03/2018   Abdominal pain, epigastric 02/28/2017   S/P TAH-BSO (total abdominal hysterectomy and bilateral salpingo-oophorectomy) 01/09/2017   Bloating 10/12/2016   Generalized abdominal pain 10/12/2016   Nausea without vomiting 10/12/2016   Essential hypertension 01/26/2016   Atypical chest pain 01/26/2016   OSA on CPAP 01/18/2016   Hypoxemia 01/18/2016   Exudative age-related macular degeneration, left eye, with active choroidal neovascularization (HCC) 10/11/2015   Cystoid macular edema of left eye 09/09/2015   Fundus coloboma, bilateral 09/09/2015   Asthma, chronic 08/04/2015   Obesity, morbid (HCC) 08/04/2015   Respiratory failure with hypoxia and hypercapnia (HCC) 08/04/2015   Chronic cough 05/22/2012   ULCERATIVE COLITIS, UNIVERSAL 04/10/2008   ANEMIA, IRON DEFICIENCY, HX OF 04/10/2008   Diaphragmatic hernia 05/16/2007  PCP: Shayne Anes, MD   REFERRING PROVIDER: Arnaldo Juliene RAMAN, MD  REFERRING DIAG: [phone] (ICD-10-CM) - Pain in right knee  THERAPY DIAG:  Chronic pain of right knee  Stiffness of left knee, not elsewhere classified  Muscle weakness (generalized)  Other symptoms and signs involving the musculoskeletal system  Rationale for Evaluation and Treatment: Rehabilitation  ONSET DATE: chronic   SUBJECTIVE:   SUBJECTIVE STATEMENT:  Pt reports 5/10 pain level in R shoulder. Had MRI yesterday. R knee has been doing okay,  only mild pain. The shoulder is more the issue right now.  Eval: Went to [place] and [place] about 23 days ago (walked 24K steps a day), had shoulder pain before the trip, knee is not feeling good now and had a fall last night too getting my foot caught on something in the kitchen. Usually end up falling on the knee. Have severe osteoporosis, have been getting injections for awhile.   PERTINENT HISTORY: See above  PAIN:  Are you having pain? Yes: NPRS scale: 1-2/10  Pain location: R knee  Pain description: its there  Aggravating factors: falling on it, moving knee after its been stiff for awhile   Relieving factors: movement   PRECAUTIONS: Fall and Other: legally blind   RED FLAGS: None   WEIGHT BEARING RESTRICTIONS: No  FALLS:  Has patient fallen in last 6 months? Yes. Number of falls 3, FOF (+)  LIVING ENVIRONMENT: Lives with: lives alone Lives in: House/apartment   OCCUPATION: retired  PLOF: Independent, Independent with basic ADLs, Independent with gait, and Independent with transfers  PATIENT GOALS: less knee stiffness, less night pain, stay mobile   NEXT MD VISIT: PRN with referring   OBJECTIVE:  Note: Objective measures were completed at Evaluation unless otherwise noted.   PATIENT SURVEYS:  PSFS: THE PATIENT SPECIFIC FUNCTIONAL SCALE  Place score of 0-10 (0 = unable to perform activity and 10 = able to perform activity at the same level as before injury or problem)  Activity Date: 11/11/24 Eval     Being in one position for a long time  3    2. Steps  5    3.     4.      Total Score 4      Total Score = Sum of activity scores/number of activities  Minimally Detectable Change: 3 points (for single activity); 2 points (for average score)  [facility] (nd). The Patient Specific Functional Scale . Retrieved from [email or web address].[email or web address]   COGNITION: Overall cognitive status: Within  functional limits for tasks assessed      MUSCLE LENGTH:  B patellas hypomobile, L>R  HS WNL Piriformis 40% limitation B       LOWER EXTREMITY ROM:  Active ROM Right eval Left eval  Hip flexion    Hip extension    Hip abduction    Hip adduction    Hip internal rotation    Hip external rotation    Knee flexion -2* 3*  Knee extension 122* 108*  Ankle dorsiflexion    Ankle plantarflexion    Ankle inversion    Ankle eversion     (Blank rows = not tested)  LOWER EXTREMITY MMT:  MMT Right eval Left eval  Hip flexion 3 3  Hip extension    Hip abduction 3 3  Hip adduction    Hip internal rotation    Hip external rotation    Knee flexion 4+ 4+  Knee extension 4 4-  Ankle dorsiflexion  Ankle plantarflexion    Ankle inversion    Ankle eversion     (Blank rows = not tested)                                                                                                                                  TREATMENT DATE:   12/1  LAQ 2x10R 3# SLR 2x10ea Bridge 2x10 Hip adduction sqz in hooklying 5 2x10 Hooklying clam GTB x30 S/l hip abduction 2x10ea Standing marching 2# 2x10 Standing hip abduction 2# 2x10bil Standing hip extension 2# 2x10bil Partial squats 2x10 at rail   11/11/24  Eval POC HEP      PATIENT EDUCATION:  Education details: exam findings, POC, HEP  Person educated: Patient Education method: Explanation, Demonstration, and Handouts Education comprehension: verbalized understanding and returned demonstration  HOME EXERCISE PROGRAM: Access Code: [email or web address] URL: [email or web address] Date: 11/11/2024 Prepared by: Josette Rough  Exercises - Supine Active Straight Leg Raise  - 1 x daily - 5 x weekly - 2 sets - 10 reps - Supine Bridge with Resistance Band  - 1 x daily - 5 x weekly - 2 sets - 5-10 reps - Seated Knee Extension with Anchored Resistance  - 1 x daily - 5 x weekly - 2 sets - 10 reps  ASSESSMENT:  CLINICAL  IMPRESSION:  Good tolerance for initiation of LE strengthening program. Felt challenged by bridges.  No c/o knee pain with any task. Monitored R shoulder pain throughout session. Pt waiting on MRI results for R shoulder.   Eval: Patient is a 73 y.o. F who was seen today for physical therapy evaluation and treatment for M25.561 (ICD-10-CM) - Pain in right knee. Objectives as above, will make every effort to improve level of function and pain.   OBJECTIVE IMPAIRMENTS: Abnormal gait, decreased activity tolerance, decreased balance, decreased mobility, difficulty walking, decreased ROM, decreased strength, increased fascial restrictions, obesity, and pain.   ACTIVITY LIMITATIONS: standing, squatting, sleeping, stairs, transfers, and locomotion level  PARTICIPATION LIMITATIONS: meal prep, cleaning, laundry, shopping, community activity, and yard work  PERSONAL FACTORS: Age, Education, Fitness, Past/current experiences, Social background, and Time since onset of injury/illness/exacerbation are also affecting patient's functional outcome.   REHAB POTENTIAL: Good  CLINICAL DECISION MAKING: Stable/uncomplicated  EVALUATION COMPLEXITY: Low   GOALS: Goals reviewed with patient? No  SHORT TERM GOALS: Target date: 12/09/2024    Will be compliant with appropriate progressive HEP Goal status: initial    2. L knee AROM flexion to match that of the R knee without pain  Goal status: initial   3. Sx and pain to have improved by at least 25% Goal status: initial    LONG TERM GOALS: Target date: 01/06/2025    MMT to have improved by one grade all weak groups Goal status: initial   2. Will score at least 20 on the DGI to show reduced fall risk Goal status: initial    3. Will be able  to ascend and descend stairs reciprocally with no increase in pain Goal status: initial   4. Will be able to sleep through the night without increased knee pain upon waking Goal status: lniital   5.  PSFS  to have improved by at least 3  points to show improved QOL and subjective improvement     PLAN:  PT FREQUENCY: 2x/week  PT DURATION: 8 weeks  PLANNED INTERVENTIONS: 97164- PT Re-evaluation, 97750- Physical Performance Testing, 97110-Therapeutic exercises, 97530- Therapeutic activity, [id number]- Neuromuscular re-education, 97535- Self Care, 02859- Manual therapy, [id number]- Gait training, and [phone]- Aquatic Therapy  PLAN FOR NEXT SESSION: functional strength and balance, L knee ROM   Asberry Rodes, PTA  11/17/24 2:33 PM     Date of referral: 09/19/24 Referring provider: Arnaldo Juliene RAMAN, MD Referring diagnosis? M25.561 (ICD-10-CM) - Pain in right knee Treatment diagnosis? (if different than referring diagnosis)   M25.561, G89.29, M25.662, M62.81, R29.898  What was this (referring dx) caused by? Ongoing Issue  Lysle of Condition: Chronic (continuous duration > 3 months)   Laterality: Rt  Current Functional Measure Score: Patient Specific Functional Scale 4  Objective measurements identify impairments when they are compared to normal values, the uninvolved extremity, and prior level of function.  [x]  Yes  []  No  Objective assessment of functional ability: Minimal functional limitations   Briefly describe symptoms: ongoing chronic pain in R knee   How did symptoms start: over time   Average pain intensity:  Last 24 hours: 4/10  Past week: 4/10  How often does the pt experience symptoms? Frequently  How much have the symptoms interfered with usual daily activities? A little bit  How has condition changed since care began at this facility? NA - initial visit  In general, how is the patients overall health? Very Good   BACK PAIN (STarT Back Screening Tool) No

## 2024-11-21 ENCOUNTER — Ambulatory Visit (HOSPITAL_BASED_OUTPATIENT_CLINIC_OR_DEPARTMENT_OTHER)

## 2024-11-21 ENCOUNTER — Encounter (HOSPITAL_BASED_OUTPATIENT_CLINIC_OR_DEPARTMENT_OTHER): Payer: Self-pay

## 2024-11-21 DIAGNOSIS — G8929 Other chronic pain: Secondary | ICD-10-CM

## 2024-11-21 DIAGNOSIS — M6281 Muscle weakness (generalized): Secondary | ICD-10-CM

## 2024-11-21 DIAGNOSIS — M25662 Stiffness of left knee, not elsewhere classified: Secondary | ICD-10-CM

## 2024-11-21 DIAGNOSIS — R29898 Other symptoms and signs involving the musculoskeletal system: Secondary | ICD-10-CM

## 2024-11-21 DIAGNOSIS — M25561 Pain in right knee: Secondary | ICD-10-CM | POA: Diagnosis not present

## 2024-11-21 NOTE — Therapy (Signed)
 OUTPATIENT PHYSICAL THERAPY LOWER EXTREMITY TREATMENT   Patient Name: Shawna Gonzalez MRN: [id number] DOB:[birth year], 73 y.o., female Today's Date: 11/21/2024  END OF SESSION:  PT End of Session - 11/21/24 1307     Visit Number 3    Number of Visits 17    Date for Recertification  01/06/25    Authorization Type UHC MCR    Authorization Time Period auth TBD    Progress Note Due on Visit 10    PT Start Time 1300    PT Stop Time 1352    PT Time Calculation (min) 52 min    Activity Tolerance Patient tolerated treatment well    Behavior During Therapy WFL for tasks assessed/performed            Past Medical History:  Diagnosis Date   Adenomatous colon polyp    Anemia    history of   Arthritis    Asthma    Atypical chest pain    Bacterial ear infection 2018   Cancer (HCC)    melanoma skin cancer 7 years ago   Cataract    Diabetes mellitus without complication (HCC)    history of taking diabetic medication, has lost weight no longer needing medciation at this time   GERD (gastroesophageal reflux disease)    Glaucoma    Heart murmur    slight   Hypertension    Macular degeneration    Osteoporosis    Sleep apnea    cpap   Ulcerative colitis, chronic (HCC)    Past Surgical History:  Procedure Laterality Date   CARPAL TUNNEL RELEASE Right 12/16/2009   CATARACT EXTRACTION W/ INTRAOCULAR LENS  IMPLANT, BILATERAL Bilateral 05/21/2007(Right); 06/04/2007(Left)   CHOLECYSTECTOMY  2000   COLONOSCOPY WITH PROPOFOL   01/23/2012   DE QUERVAIN'S RELEASE Right 11/30/2005   HYSTERECTOMY ABDOMINAL WITH SALPINGECTOMY Bilateral 01/09/2017   Procedure: HYSTERECTOMY ABDOMINAL WITH SALPINGOOPHORECTOMY;  Surgeon: Rosaline Cobble, MD;  Location: [facility];  Service: Gynecology;  Laterality: Bilateral;   TONSILLECTOMY     TRIGGER FINGER RELEASE Right    UPPER GI ENDOSCOPY  03/05/2017   MAC   Patient Active Problem List   Diagnosis Date Noted   Obesity hypoventilation syndrome (HCC)  02/01/2021   Morbid obesity with BMI of 40.0-44.9, adult (HCC) 02/01/2021   Displacement of lumbar intervertebral disc without myelopathy 05/19/2019   Lumbosacral spondylosis without myelopathy 05/05/2019   Trochanteric bursitis of left hip 04/02/2019   Pain of left hip joint 04/01/2019   Adhesive capsulitis of left shoulder 03/12/2019   Pain of left hand 04/23/2018   Low back pain 02/06/2018   Nocturnal hypoxemia 01/24/2018   Insomnia secondary to chronic pain 01/24/2018   OAG (open angle glaucoma) suspect, high risk, bilateral 01/03/2018   Abdominal pain, epigastric 02/28/2017   S/P TAH-BSO (total abdominal hysterectomy and bilateral salpingo-oophorectomy) 01/09/2017   Bloating 10/12/2016   Generalized abdominal pain 10/12/2016   Nausea without vomiting 10/12/2016   Essential hypertension 01/26/2016   Atypical chest pain 01/26/2016   OSA on CPAP 01/18/2016   Hypoxemia 01/18/2016   Exudative age-related macular degeneration, left eye, with active choroidal neovascularization (HCC) 10/11/2015   Cystoid macular edema of left eye 09/09/2015   Fundus coloboma, bilateral 09/09/2015   Asthma, chronic 08/04/2015   Obesity, morbid (HCC) 08/04/2015   Respiratory failure with hypoxia and hypercapnia (HCC) 08/04/2015   Chronic cough 05/22/2012   ULCERATIVE COLITIS, UNIVERSAL 04/10/2008   ANEMIA, IRON DEFICIENCY, HX OF 04/10/2008   Diaphragmatic hernia 05/16/2007  PCP: Shayne Anes, MD   REFERRING PROVIDER: Arnaldo Juliene RAMAN, MD  REFERRING DIAG: [phone] (ICD-10-CM) - Pain in right knee  THERAPY DIAG:  Chronic pain of right knee  Stiffness of left knee, not elsewhere classified  Muscle weakness (generalized)  Other symptoms and signs involving the musculoskeletal system  Rationale for Evaluation and Treatment: Rehabilitation  ONSET DATE: chronic   SUBJECTIVE:   SUBJECTIVE STATEMENT:  Pt reports her shoulder is hurting a lot today after lifting some Christmas tote. Received  shoulder MRI results but has not reviewed them with MD. No soreness or pain in LES after last session.  Eval: Went to [place] and [place] about 23 days ago (walked 24K steps a day), had shoulder pain before the trip, knee is not feeling good now and had a fall last night too getting my foot caught on something in the kitchen. Usually end up falling on the knee. Have severe osteoporosis, have been getting injections for awhile.   PERTINENT HISTORY: See above  PAIN:  Are you having pain? Yes: NPRS scale: 1-2/10  Pain location: R knee  Pain description: its there  Aggravating factors: falling on it, moving knee after its been stiff for awhile   Relieving factors: movement   PRECAUTIONS: Fall and Other: legally blind   RED FLAGS: None   WEIGHT BEARING RESTRICTIONS: No  FALLS:  Has patient fallen in last 6 months? Yes. Number of falls 3, FOF (+)  LIVING ENVIRONMENT: Lives with: lives alone Lives in: House/apartment   OCCUPATION: retired  PLOF: Independent, Independent with basic ADLs, Independent with gait, and Independent with transfers  PATIENT GOALS: less knee stiffness, less night pain, stay mobile   NEXT MD VISIT: PRN with referring   OBJECTIVE:  Note: Objective measures were completed at Evaluation unless otherwise noted.   PATIENT SURVEYS:  PSFS: THE PATIENT SPECIFIC FUNCTIONAL SCALE  Place score of 0-10 (0 = unable to perform activity and 10 = able to perform activity at the same level as before injury or problem)  Activity Date: 11/11/24 Eval     Being in one position for a long time  3    2. Steps  5    3.     4.      Total Score 4      Total Score = Sum of activity scores/number of activities  Minimally Detectable Change: 3 points (for single activity); 2 points (for average score)  [facility] (nd). The Patient Specific Functional Scale . Retrieved from [email or web address]    COGNITION: Overall cognitive status: Within functional limits for tasks assessed      MUSCLE LENGTH:  B patellas hypomobile, L>R  HS WNL Piriformis 40% limitation B       LOWER EXTREMITY ROM:  Active ROM Right eval Left eval  Hip flexion    Hip extension    Hip abduction    Hip adduction    Hip internal rotation    Hip external rotation    Knee flexion -2* 3*  Knee extension 122* 108*  Ankle dorsiflexion    Ankle plantarflexion    Ankle inversion    Ankle eversion     (Blank rows = not tested)  LOWER EXTREMITY MMT:  MMT Right eval Left eval  Hip flexion 3 3  Hip extension    Hip abduction 3 3  Hip adduction    Hip internal rotation    Hip external rotation    Knee flexion 4+ 4+  Knee extension  4 4-  Ankle dorsiflexion    Ankle plantarflexion    Ankle inversion    Ankle eversion     (Blank rows = not tested)                                                                                                                                  TREATMENT DATE:    12/5  LAQ 3x10bil 3# SLR 2x10ea Bridge 3x10 Hip adduction sqz in hooklying 5 3x10 Hooklying clam GTB x30 S/l hip abduction 3x10ea Standing marching 2# 2x10 Standing hip abduction 2# 2x10bil Standing hip extension 2# 2x10bil Partial squats 2x10 at rail Gait observation- sway to L side, knee hyperextension Walking marching SLS Education on trendelenberg sign and awareness Recumbent bike seat 0 L3 x73min  12/1  LAQ 2x10R 3# SLR 2x10ea Bridge 2x10 Hip adduction sqz in hooklying 5 2x10 Hooklying clam GTB x30 S/l hip abduction 2x10ea Standing marching 2# 2x10 Standing hip abduction 2# 2x10bil Standing hip extension 2# 2x10bil Partial squats 2x10 at rail   11/11/24  Eval POC HEP      PATIENT EDUCATION:  Education details: exam findings, POC, HEP  Person educated: Patient Education method: Explanation, Demonstration, and Handouts Education comprehension: verbalized  understanding and returned demonstration  HOME EXERCISE PROGRAM: Access Code: [email or web address] URL: [email or web address] Date: 11/11/2024 Prepared by: Josette Rough  Exercises - Supine Active Straight Leg Raise  - 1 x daily - 5 x weekly - 2 sets - 10 reps - Supine Bridge with Resistance Band  - 1 x daily - 5 x weekly - 2 sets - 5-10 reps - Seated Knee Extension with Anchored Resistance  - 1 x daily - 5 x weekly - 2 sets - 10 reps  ASSESSMENT:  CLINICAL IMPRESSION:  Pt with good tolerance for progressions to LE strengthening. Observed gait in clinic today with pt demonstrating a trunk lean to L as well as bil knee hyperextension. With walking marches, pt demonstrates significant balance deficits. Unable to achieve SLS without UE support. When cued to use finger tip support with SLS, she demonstrated trendelenberg drop bilaterally and increased knee hyperextension. Pt will benefit from ongoing postural awareness, LE strengthening, and stability training to improve safety and function.   Eval: Patient is a 73 y.o. F who was seen today for physical therapy evaluation and treatment for M25.561 (ICD-10-CM) - Pain in right knee. Objectives as above, will make every effort to improve level of function and pain.   OBJECTIVE IMPAIRMENTS: Abnormal gait, decreased activity tolerance, decreased balance, decreased mobility, difficulty walking, decreased ROM, decreased strength, increased fascial restrictions, obesity, and pain.   ACTIVITY LIMITATIONS: standing, squatting, sleeping, stairs, transfers, and locomotion level  PARTICIPATION LIMITATIONS: meal prep, cleaning, laundry, shopping, community activity, and yard work  PERSONAL FACTORS: Age, Education, Fitness, Past/current experiences, Social background, and Time since onset of injury/illness/exacerbation are also affecting patient's functional outcome.   REHAB POTENTIAL: Good  CLINICAL DECISION MAKING: Stable/uncomplicated  EVALUATION  COMPLEXITY: Low   GOALS: Goals reviewed with patient? No  SHORT TERM GOALS: Target date: 12/09/2024    Will be compliant with appropriate progressive HEP Goal status: initial    2. L knee AROM flexion to match that of the R knee without pain  Goal status: initial   3. Sx and pain to have improved by at least 25% Goal status: initial    LONG TERM GOALS: Target date: 01/06/2025    MMT to have improved by one grade all weak groups Goal status: initial   2. Will score at least 20 on the DGI to show reduced fall risk Goal status: initial    3. Will be able to ascend and descend stairs reciprocally with no increase in pain Goal status: initial   4. Will be able to sleep through the night without increased knee pain upon waking Goal status: lniital   5. PSFS  to have improved by at least 3  points to show improved QOL and subjective improvement     PLAN:  PT FREQUENCY: 2x/week  PT DURATION: 8 weeks  PLANNED INTERVENTIONS: 97164- PT Re-evaluation, 97750- Physical Performance Testing, 97110-Therapeutic exercises, 97530- Therapeutic activity, [id number]- Neuromuscular re-education, 97535- Self Care, [id number]- Manual therapy, [id number]- Gait training, and [phone]- Aquatic Therapy  PLAN FOR NEXT SESSION: functional strength and balance, L knee ROM   Asberry Rodes, PTA  11/21/24 2:16 PM     Date of referral: 09/19/24 Referring provider: Arnaldo Juliene RAMAN, MD Referring diagnosis? M25.561 (ICD-10-CM) - Pain in right knee Treatment diagnosis? (if different than referring diagnosis)   M25.561, G89.29, M25.662, M62.81, R29.898  What was this (referring dx) caused by? Ongoing Issue  Lysle of Condition: Chronic (continuous duration > 3 months)   Laterality: Rt  Current Functional Measure Score: Patient Specific Functional Scale 4  Objective measurements identify impairments when they are compared to normal values, the uninvolved extremity, and prior level of function.  [x]   Yes  []  No  Objective assessment of functional ability: Minimal functional limitations   Briefly describe symptoms: ongoing chronic pain in R knee   How did symptoms start: over time   Average pain intensity:  Last 24 hours: 4/10  Past week: 4/10  How often does the pt experience symptoms? Frequently  How much have the symptoms interfered with usual daily activities? A little bit  How has condition changed since care began at this facility? NA - initial visit  In general, how is the patients overall health? Very Good   BACK PAIN (STarT Back Screening Tool) No

## 2024-11-25 ENCOUNTER — Encounter (HOSPITAL_BASED_OUTPATIENT_CLINIC_OR_DEPARTMENT_OTHER): Payer: Self-pay

## 2024-11-25 ENCOUNTER — Ambulatory Visit (HOSPITAL_BASED_OUTPATIENT_CLINIC_OR_DEPARTMENT_OTHER)

## 2024-11-25 DIAGNOSIS — M25561 Pain in right knee: Secondary | ICD-10-CM | POA: Diagnosis not present

## 2024-11-25 DIAGNOSIS — R29898 Other symptoms and signs involving the musculoskeletal system: Secondary | ICD-10-CM

## 2024-11-25 DIAGNOSIS — M6281 Muscle weakness (generalized): Secondary | ICD-10-CM

## 2024-11-25 DIAGNOSIS — M25662 Stiffness of left knee, not elsewhere classified: Secondary | ICD-10-CM

## 2024-11-25 DIAGNOSIS — G8929 Other chronic pain: Secondary | ICD-10-CM

## 2024-11-25 NOTE — Therapy (Signed)
 OUTPATIENT PHYSICAL THERAPY LOWER EXTREMITY TREATMENT   Patient Name: Shawna Gonzalez MRN: [id number] DOB:05-Jul-1951, 73 y.o., female Today's Date: 11/25/2024  END OF SESSION:  PT End of Session - 11/25/24 1339     Visit Number 4    Number of Visits 17    Date for Recertification  01/06/25    Authorization Type UHC MCR    Authorization Time Period auth TBD    Progress Note Due on Visit 10    PT Start Time 1342    PT Stop Time 1430    PT Time Calculation (min) 48 min    Activity Tolerance Patient tolerated treatment well    Behavior During Therapy WFL for tasks assessed/performed             Past Medical History:  Diagnosis Date   Adenomatous colon polyp    Anemia    history of   Arthritis    Asthma    Atypical chest pain    Bacterial ear infection 2018   Cancer (HCC)    melanoma skin cancer 7 years ago   Cataract    Diabetes mellitus without complication (HCC)    history of taking diabetic medication, has lost weight no longer needing medciation at this time   GERD (gastroesophageal reflux disease)    Glaucoma    Heart murmur    slight   Hypertension    Macular degeneration    Osteoporosis    Sleep apnea    cpap   Ulcerative colitis, chronic (HCC)    Past Surgical History:  Procedure Laterality Date   CARPAL TUNNEL RELEASE Right 12/16/2009   CATARACT EXTRACTION W/ INTRAOCULAR LENS  IMPLANT, BILATERAL Bilateral 05/21/2007(Right); 06/04/2007(Left)   CHOLECYSTECTOMY  2000   COLONOSCOPY WITH PROPOFOL   01/23/2012   DE QUERVAIN'S RELEASE Right 11/30/2005   HYSTERECTOMY ABDOMINAL WITH SALPINGECTOMY Bilateral 01/09/2017   Procedure: HYSTERECTOMY ABDOMINAL WITH SALPINGOOPHORECTOMY;  Surgeon: Rosaline Cobble, MD;  Location: [facility];  Service: Gynecology;  Laterality: Bilateral;   TONSILLECTOMY     TRIGGER FINGER RELEASE Right    UPPER GI ENDOSCOPY  03/05/2017   MAC   Patient Active Problem List   Diagnosis Date Noted   Obesity hypoventilation syndrome (HCC)  02/01/2021   Morbid obesity with BMI of 40.0-44.9, adult (HCC) 02/01/2021   Displacement of lumbar intervertebral disc without myelopathy 05/19/2019   Lumbosacral spondylosis without myelopathy 05/05/2019   Trochanteric bursitis of left hip 04/02/2019   Pain of left hip joint 04/01/2019   Adhesive capsulitis of left shoulder 03/12/2019   Pain of left hand 04/23/2018   Low back pain 02/06/2018   Nocturnal hypoxemia 01/24/2018   Insomnia secondary to chronic pain 01/24/2018   OAG (open angle glaucoma) suspect, high risk, bilateral 01/03/2018   Abdominal pain, epigastric 02/28/2017   S/P TAH-BSO (total abdominal hysterectomy and bilateral salpingo-oophorectomy) 01/09/2017   Bloating 10/12/2016   Generalized abdominal pain 10/12/2016   Nausea without vomiting 10/12/2016   Essential hypertension 01/26/2016   Atypical chest pain 01/26/2016   OSA on CPAP 01/18/2016   Hypoxemia 01/18/2016   Exudative age-related macular degeneration, left eye, with active choroidal neovascularization (HCC) 10/11/2015   Cystoid macular edema of left eye 09/09/2015   Fundus coloboma, bilateral 09/09/2015   Asthma, chronic 08/04/2015   Obesity, morbid (HCC) 08/04/2015   Respiratory failure with hypoxia and hypercapnia (HCC) 08/04/2015   Chronic cough 05/22/2012   ULCERATIVE COLITIS, UNIVERSAL 04/10/2008   ANEMIA, IRON DEFICIENCY, HX OF 04/10/2008   Diaphragmatic hernia 05/16/2007  PCP: Shayne Anes, MD   REFERRING PROVIDER: Arnaldo Juliene RAMAN, MD  REFERRING DIAG: [phone] (ICD-10-CM) - Pain in right knee  THERAPY DIAG:  Chronic pain of right knee  Stiffness of left knee, not elsewhere classified  Muscle weakness (generalized)  Other symptoms and signs involving the musculoskeletal system  Rationale for Evaluation and Treatment: Rehabilitation  ONSET DATE: chronic   SUBJECTIVE:   SUBJECTIVE STATEMENT:  Pt reports her shoulder is hurting a lot today after lifting some Christmas tote. Received  shoulder MRI results but has not reviewed them with MD. No soreness or pain in LES after last session.  Eval: Went to [place] and [place] about 23 days ago (walked 24K steps a day), had shoulder pain before the trip, knee is not feeling good now and had a fall last night too getting my foot caught on something in the kitchen. Usually end up falling on the knee. Have severe osteoporosis, have been getting injections for awhile.   PERTINENT HISTORY: See above  PAIN:  Are you having pain? Yes: NPRS scale: 1-2/10  Pain location: R knee  Pain description: its there  Aggravating factors: falling on it, moving knee after its been stiff for awhile   Relieving factors: movement   PRECAUTIONS: Fall and Other: legally blind   RED FLAGS: None   WEIGHT BEARING RESTRICTIONS: No  FALLS:  Has patient fallen in last 6 months? Yes. Number of falls 3, FOF (+)  LIVING ENVIRONMENT: Lives with: lives alone Lives in: House/apartment   OCCUPATION: retired  PLOF: Independent, Independent with basic ADLs, Independent with gait, and Independent with transfers  PATIENT GOALS: less knee stiffness, less night pain, stay mobile   NEXT MD VISIT: PRN with referring   OBJECTIVE:  Note: Objective measures were completed at Evaluation unless otherwise noted.   PATIENT SURVEYS:  PSFS: THE PATIENT SPECIFIC FUNCTIONAL SCALE  Place score of 0-10 (0 = unable to perform activity and 10 = able to perform activity at the same level as before injury or problem)  Activity Date: 11/11/24 Eval     Being in one position for a long time  3    2. Steps  5    3.     4.      Total Score 4      Total Score = Sum of activity scores/number of activities  Minimally Detectable Change: 3 points (for single activity); 2 points (for average score)  [employer] [facility] (nd). The Patient Specific Functional Scale . Retrieved from Skateoasis.com.pt    COGNITION: Overall cognitive status: Within functional limits for tasks assessed      MUSCLE LENGTH:  B patellas hypomobile, L>R  HS WNL Piriformis 40% limitation B       LOWER EXTREMITY ROM:  Active ROM Right eval Left eval  Hip flexion    Hip extension    Hip abduction    Hip adduction    Hip internal rotation    Hip external rotation    Knee flexion -2* 3*  Knee extension 122* 108*  Ankle dorsiflexion    Ankle plantarflexion    Ankle inversion    Ankle eversion     (Blank rows = not tested)  LOWER EXTREMITY MMT:  MMT Right eval Left eval  Hip flexion 3 3  Hip extension    Hip abduction 3 3  Hip adduction    Hip internal rotation    Hip external rotation    Knee flexion 4+ 4+  Knee extension  4 4-  Ankle dorsiflexion    Ankle plantarflexion    Ankle inversion    Ankle eversion     (Blank rows = not tested)                                                                                                                                  TREATMENT DATE:   12/9  LAQ 3x10bil 3# SLR 2x10ea Bridge 3x10 Hip adduction sqz in hooklying 5 3x10 Hooklying clam GTB 2x30 S/l hip abduction 4x10ea Hip hikes 4 x20ea Standing hip extension 0# 2x10bil- cues for PPT/core activation Partial squats 2x10 at rail Gait in hall to observe hip drop Standing marching with cues to correct hip drop    12/5  LAQ 3x10bil 3# SLR 3x10ea Bridge 3x10 Hip adduction sqz in hooklying 5 3x10 Hooklying clam GTB x30 S/l hip abduction 3x10ea Standing marching 2# 2x10 Standing hip abduction 2# 2x10bil Standing hip extension 2# 2x10bil Partial squats 2x10 at rail Gait observation- sway to L side, knee hyperextension Walking marching SLS Education on trendelenberg sign and awareness Recumbent bike seat 0 L3 x59min  12/1  LAQ 2x10R 3# SLR 2x10ea Bridge 2x10 Hip adduction sqz in hooklying 5 2x10 Hooklying clam GTB x30 S/l hip abduction 2x10ea Standing marching  2# 2x10 Standing hip abduction 2# 2x10bil Standing hip extension 2# 2x10bil Partial squats 2x10 at rail   11/11/24  Eval POC HEP      PATIENT EDUCATION:  Education details: exam findings, POC, HEP  Person educated: Patient Education method: Explanation, Demonstration, and Handouts Education comprehension: verbalized understanding and returned demonstration  HOME EXERCISE PROGRAM: Access Code: [email or web address] URL: [email or web address] Date: 11/11/2024 Prepared by: Josette Rough  Exercises - Supine Active Straight Leg Raise  - 1 x daily - 5 x weekly - 2 sets - 10 reps - Supine Bridge with Resistance Band  - 1 x daily - 5 x weekly - 2 sets - 5-10 reps - Seated Knee Extension with Anchored Resistance  - 1 x daily - 5 x weekly - 2 sets - 10 reps  ASSESSMENT:  CLINICAL IMPRESSION:    Good tolerance for LE strengthening progressions. Continued to work on hip drop today with good improvement observed. Pt was able to increase her awareness of this and was able to correct this after cuing. Worked on hip hiking off 4in step as well as standing marching with tactile cues to prevent pelvic drop. Observed pt's gait following this which improved greatly, though some dropping is still noted. Will continue to progress as tolerated.   Eval: Patient is a 73 y.o. F who was seen today for physical therapy evaluation and treatment for M25.561 (ICD-10-CM) - Pain in right knee. Objectives as above, will make every effort to improve level of function and pain.   OBJECTIVE IMPAIRMENTS: Abnormal gait, decreased activity tolerance, decreased balance, decreased mobility, difficulty walking, decreased ROM, decreased strength, increased fascial restrictions, obesity, and pain.  ACTIVITY LIMITATIONS: standing, squatting, sleeping, stairs, transfers, and locomotion level  PARTICIPATION LIMITATIONS: meal prep, cleaning, laundry, shopping, community activity, and yard work  PERSONAL FACTORS: Age,  Education, Fitness, Past/current experiences, Social background, and Time since onset of injury/illness/exacerbation are also affecting patient's functional outcome.   REHAB POTENTIAL: Good  CLINICAL DECISION MAKING: Stable/uncomplicated  EVALUATION COMPLEXITY: Low   GOALS: Goals reviewed with patient? No  SHORT TERM GOALS: Target date: 12/09/2024    Will be compliant with appropriate progressive HEP Goal status: MET 12/9   2. L knee AROM flexion to match that of the R knee without pain  Goal status: initial   3. Sx and pain to have improved by at least 25% Goal status: initial    LONG TERM GOALS: Target date: 01/06/2025    MMT to have improved by one grade all weak groups Goal status: initial   2. Will score at least 20 on the DGI to show reduced fall risk Goal status: initial    3. Will be able to ascend and descend stairs reciprocally with no increase in pain Goal status: initial   4. Will be able to sleep through the night without increased knee pain upon waking Goal status: lniital   5. PSFS  to have improved by at least 3  points to show improved QOL and subjective improvement     PLAN:  PT FREQUENCY: 2x/week  PT DURATION: 8 weeks  PLANNED INTERVENTIONS: 97164- PT Re-evaluation, 97750- Physical Performance Testing, 97110-Therapeutic exercises, 97530- Therapeutic activity, [id number]- Neuromuscular re-education, 97535- Self Care, [id number]- Manual therapy, [id number]- Gait training, and [phone]- Aquatic Therapy  PLAN FOR NEXT SESSION: functional strength and balance, L knee ROM   Asberry Rodes, PTA  11/25/24 2:49 PM     Date of referral: 09/19/24 Referring provider: Arnaldo Juliene RAMAN, MD Referring diagnosis? M25.561 (ICD-10-CM) - Pain in right knee Treatment diagnosis? (if different than referring diagnosis)   M25.561, G89.29, M25.662, M62.81, R29.898  What was this (referring dx) caused by? Ongoing Issue  Lysle of Condition: Chronic (continuous duration  > 3 months)   Laterality: Rt  Current Functional Measure Score: Patient Specific Functional Scale 4  Objective measurements identify impairments when they are compared to normal values, the uninvolved extremity, and prior level of function.  [x]  Yes  []  No  Objective assessment of functional ability: Minimal functional limitations   Briefly describe symptoms: ongoing chronic pain in R knee   How did symptoms start: over time   Average pain intensity:  Last 24 hours: 4/10  Past week: 4/10  How often does the pt experience symptoms? Frequently  How much have the symptoms interfered with usual daily activities? A little bit  How has condition changed since care began at this facility? NA - initial visit  In general, how is the patients overall health? Very Good   BACK PAIN (STarT Back Screening Tool) No

## 2024-11-27 ENCOUNTER — Ambulatory Visit (HOSPITAL_BASED_OUTPATIENT_CLINIC_OR_DEPARTMENT_OTHER): Admitting: Physical Therapy

## 2024-11-27 ENCOUNTER — Encounter (HOSPITAL_BASED_OUTPATIENT_CLINIC_OR_DEPARTMENT_OTHER): Payer: Self-pay | Admitting: Physical Therapy

## 2024-11-27 DIAGNOSIS — M25662 Stiffness of left knee, not elsewhere classified: Secondary | ICD-10-CM

## 2024-11-27 DIAGNOSIS — G8929 Other chronic pain: Secondary | ICD-10-CM

## 2024-11-27 DIAGNOSIS — R29898 Other symptoms and signs involving the musculoskeletal system: Secondary | ICD-10-CM

## 2024-11-27 DIAGNOSIS — M6281 Muscle weakness (generalized): Secondary | ICD-10-CM

## 2024-11-27 DIAGNOSIS — M25561 Pain in right knee: Secondary | ICD-10-CM | POA: Diagnosis not present

## 2024-11-27 NOTE — Therapy (Signed)
 OUTPATIENT PHYSICAL THERAPY LOWER EXTREMITY TREATMENT   Patient Name: Shawna Gonzalez MRN: [id number] DOB:07-07-1951, 73 y.o., female Today's Date: 11/27/2024  END OF SESSION:  PT End of Session - 11/27/24 1103     Visit Number 5    Number of Visits 17    Date for Recertification  01/06/25    Authorization Type [employer] MCR    Authorization Time Period 11/11/24 to 01/06/25    Authorization - Visit Number 4    Authorization - Number of Visits 16    Progress Note Due on Visit 10    PT Start Time 1102    PT Stop Time 1144    PT Time Calculation (min) 42 min    Activity Tolerance Patient tolerated treatment well    Behavior During Therapy WFL for tasks assessed/performed              Past Medical History:  Diagnosis Date   Adenomatous colon polyp    Anemia    history of   Arthritis    Asthma    Atypical chest pain    Bacterial ear infection 2018   Cancer (HCC)    melanoma skin cancer 7 years ago   Cataract    Diabetes mellitus without complication (HCC)    history of taking diabetic medication, has lost weight no longer needing medciation at this time   GERD (gastroesophageal reflux disease)    Glaucoma    Heart murmur    slight   Hypertension    Macular degeneration    Osteoporosis    Sleep apnea    cpap   Ulcerative colitis, chronic (HCC)    Past Surgical History:  Procedure Laterality Date   CARPAL TUNNEL RELEASE Right 12/16/2009   CATARACT EXTRACTION W/ INTRAOCULAR LENS  IMPLANT, BILATERAL Bilateral 05/21/2007(Right); 06/04/2007(Left)   CHOLECYSTECTOMY  2000   COLONOSCOPY WITH PROPOFOL   01/23/2012   DE QUERVAIN'S RELEASE Right 11/30/2005   HYSTERECTOMY ABDOMINAL WITH SALPINGECTOMY Bilateral 01/09/2017   Procedure: HYSTERECTOMY ABDOMINAL WITH SALPINGOOPHORECTOMY;  Surgeon: Rosaline Cobble, MD;  Location: [facility];  Service: Gynecology;  Laterality: Bilateral;   TONSILLECTOMY     TRIGGER FINGER RELEASE Right    UPPER GI ENDOSCOPY  03/05/2017   MAC    Patient Active Problem List   Diagnosis Date Noted   Obesity hypoventilation syndrome (HCC) 02/01/2021   Morbid obesity with BMI of 40.0-44.9, adult (HCC) 02/01/2021   Displacement of lumbar intervertebral disc without myelopathy 05/19/2019   Lumbosacral spondylosis without myelopathy 05/05/2019   Trochanteric bursitis of left hip 04/02/2019   Pain of left hip joint 04/01/2019   Adhesive capsulitis of left shoulder 03/12/2019   Pain of left hand 04/23/2018   Low back pain 02/06/2018   Nocturnal hypoxemia 01/24/2018   Insomnia secondary to chronic pain 01/24/2018   OAG (open angle glaucoma) suspect, high risk, bilateral 01/03/2018   Abdominal pain, epigastric 02/28/2017   S/P TAH-BSO (total abdominal hysterectomy and bilateral salpingo-oophorectomy) 01/09/2017   Bloating 10/12/2016   Generalized abdominal pain 10/12/2016   Nausea without vomiting 10/12/2016   Essential hypertension 01/26/2016   Atypical chest pain 01/26/2016   OSA on CPAP 01/18/2016   Hypoxemia 01/18/2016   Exudative age-related macular degeneration, left eye, with active choroidal neovascularization (HCC) 10/11/2015   Cystoid macular edema of left eye 09/09/2015   Fundus coloboma, bilateral 09/09/2015   Asthma, chronic 08/04/2015   Obesity, morbid (HCC) 08/04/2015   Respiratory failure with hypoxia and hypercapnia (HCC) 08/04/2015   Chronic cough 05/22/2012  ULCERATIVE COLITIS, UNIVERSAL 04/10/2008   ANEMIA, IRON DEFICIENCY, HX OF 04/10/2008   Diaphragmatic hernia 05/16/2007    PCP: Shayne Anes, MD   REFERRING PROVIDER: Arnaldo Juliene RAMAN, MD  REFERRING DIAG: [phone] (ICD-10-CM) - Pain in right knee  THERAPY DIAG:  Chronic pain of right knee  Stiffness of left knee, not elsewhere classified  Muscle weakness (generalized)  Other symptoms and signs involving the musculoskeletal system  Rationale for Evaluation and Treatment: Rehabilitation  ONSET DATE: chronic   SUBJECTIVE:   SUBJECTIVE  STATEMENT:  Headed for shoulder surgery, don't have a date just yet. Knee is feeling better than it was before I made the appt. Balance is still bad. Shoulder was really bad last night and today   Eval: Went to [place] and [place] about 23 days ago (walked 24K steps a day), had shoulder pain before the trip, knee is not feeling good now and had a fall last night too getting my foot caught on something in the kitchen. Usually end up falling on the knee. Have severe osteoporosis, have been getting injections for awhile.   PERTINENT HISTORY: See above  PAIN:  Are you having pain? Yes: NPRS scale: 1-2/10  Pain location: R knee  Pain description: its there, can have some odd popping or pain in the middle of the kneecap too  Aggravating factors: falling on it, moving knee after its been stiff for awhile   Relieving factors: movement   PRECAUTIONS: Fall and Other: legally blind   RED FLAGS: None   WEIGHT BEARING RESTRICTIONS: No  FALLS:  Has patient fallen in last 6 months? Yes. Number of falls 3, FOF (+)  LIVING ENVIRONMENT: Lives with: lives alone Lives in: House/apartment   OCCUPATION: retired  PLOF: Independent, Independent with basic ADLs, Independent with gait, and Independent with transfers  PATIENT GOALS: less knee stiffness, less night pain, stay mobile   NEXT MD VISIT: PRN with referring   OBJECTIVE:  Note: Objective measures were completed at Evaluation unless otherwise noted.   PATIENT SURVEYS:  PSFS: THE PATIENT SPECIFIC FUNCTIONAL SCALE  Place score of 0-10 (0 = unable to perform activity and 10 = able to perform activity at the same level as before injury or problem)  Activity Date: 11/11/24 Eval     Being in one position for a long time  3    2. Steps  5    3.     4.      Total Score 4      Total Score = Sum of activity scores/number of activities  Minimally Detectable Change: 3 points (for single activity); 2 points (for average score)  [facility] (nd). The Patient Specific Functional Scale . Retrieved from [email or web address]   COGNITION: Overall cognitive status: Within functional limits for tasks assessed      MUSCLE LENGTH:  B patellas hypomobile, L>R  HS WNL Piriformis 40% limitation B       LOWER EXTREMITY ROM:  Active ROM Right eval Left eval  Hip flexion    Hip extension    Hip abduction    Hip adduction    Hip internal rotation    Hip external rotation    Knee flexion -2* 3*  Knee extension 122* 108*  Ankle dorsiflexion    Ankle plantarflexion    Ankle inversion    Ankle eversion     (Blank rows = not tested)  LOWER EXTREMITY MMT:  MMT Right eval Left eval  Hip flexion 3 3  Hip extension    Hip abduction 3 3  Hip adduction    Hip internal rotation    Hip external rotation    Knee flexion 4+ 4+  Knee extension 4 4-  Ankle dorsiflexion    Ankle plantarflexion    Ankle inversion    Ankle eversion     (Blank rows = not tested)                                                                                                                                  TREATMENT DATE:   11/27/24  Scifit bike L3, seat 1 x8 min   Bridges + ABD into blue TB x12 Sidelying clams blue TB x12  Walking bridges x8 Checked for LLD, SI MET for LLD with no effect L LE about 1/4 inch longer placed shoe lift in R (one layer peeled off)- educated to take one layer off if pain increases, and take out completely if she still has increased pain after that  Hip hikes x12 B Hip hikes + ABD x10 B Hip hikes + swing x10 B    12/9  LAQ 3x10bil 3# SLR 2x10ea Bridge 3x10 Hip adduction sqz in hooklying 5 3x10 Hooklying clam GTB 2x30 S/l hip abduction 4x10ea Hip hikes 4 x20ea Standing hip extension 0# 2x10bil- cues for PPT/core activation Partial squats 2x10 at rail Gait in hall to observe hip drop Standing marching with cues to correct hip  drop    12/5  LAQ 3x10bil 3# SLR 3x10ea Bridge 3x10 Hip adduction sqz in hooklying 5 3x10 Hooklying clam GTB x30 S/l hip abduction 3x10ea Standing marching 2# 2x10 Standing hip abduction 2# 2x10bil Standing hip extension 2# 2x10bil Partial squats 2x10 at rail Gait observation- sway to L side, knee hyperextension Walking marching SLS Education on trendelenberg sign and awareness Recumbent bike seat 0 L3 x53min  12/1  LAQ 2x10R 3# SLR 2x10ea Bridge 2x10 Hip adduction sqz in hooklying 5 2x10 Hooklying clam GTB x30 S/l hip abduction 2x10ea Standing marching 2# 2x10 Standing hip abduction 2# 2x10bil Standing hip extension 2# 2x10bil Partial squats 2x10 at rail   11/11/24  Eval POC HEP      PATIENT EDUCATION:  Education details: exam findings, POC, HEP  Person educated: Patient Education method: Explanation, Demonstration, and Handouts Education comprehension: verbalized understanding and returned demonstration  HOME EXERCISE PROGRAM: Access Code: [email or web address] URL: [email or web address] Date: 11/11/2024 Prepared by: Josette Rough  Exercises - Supine Active Straight Leg Raise  - 1 x daily - 5 x weekly - 2 sets - 10 reps - Supine Bridge with Resistance Band  - 1 x daily - 5 x weekly - 2 sets - 5-10 reps - Seated Knee Extension with Anchored Resistance  - 1 x daily - 5 x weekly - 2 sets - 10 reps  ASSESSMENT:  CLINICAL IMPRESSION:    Continued to progress strengthening based interventions today, also screened for LLD or SI  involvement. SI MET had no effect, so we tried placing a heel lift today in R shoe. She is going to have shoulder surgery but not sure when- she would like to wait to see what happens with this before scheduling more appts for her knee.   Eval: Patient is a 73 y.o. F who was seen today for physical therapy evaluation and treatment for M25.561 (ICD-10-CM) - Pain in right knee. Objectives as above, will make every effort to improve  level of function and pain.   OBJECTIVE IMPAIRMENTS: Abnormal gait, decreased activity tolerance, decreased balance, decreased mobility, difficulty walking, decreased ROM, decreased strength, increased fascial restrictions, obesity, and pain.   ACTIVITY LIMITATIONS: standing, squatting, sleeping, stairs, transfers, and locomotion level  PARTICIPATION LIMITATIONS: meal prep, cleaning, laundry, shopping, community activity, and yard work  PERSONAL FACTORS: Age, Education, Fitness, Past/current experiences, Social background, and Time since onset of injury/illness/exacerbation are also affecting patient's functional outcome.   REHAB POTENTIAL: Good  CLINICAL DECISION MAKING: Stable/uncomplicated  EVALUATION COMPLEXITY: Low   GOALS: Goals reviewed with patient? No  SHORT TERM GOALS: Target date: 12/09/2024    Will be compliant with appropriate progressive HEP Goal status: MET 12/9   2. L knee AROM flexion to match that of the R knee without pain  Goal status: initial   3. Sx and pain to have improved by at least 25% Goal status: initial    LONG TERM GOALS: Target date: 01/06/2025    MMT to have improved by one grade all weak groups Goal status: initial   2. Will score at least 20 on the DGI to show reduced fall risk Goal status: initial    3. Will be able to ascend and descend stairs reciprocally with no increase in pain Goal status: initial   4. Will be able to sleep through the night without increased knee pain upon waking Goal status: lniital   5. PSFS  to have improved by at least 3  points to show improved QOL and subjective improvement     PLAN:  PT FREQUENCY: 2x/week  PT DURATION: 8 weeks  PLANNED INTERVENTIONS: [id number]- PT Re-evaluation, 97750- Physical Performance Testing, 97110-Therapeutic exercises, 97530- Therapeutic activity, [id number]- Neuromuscular re-education, 97535- Self Care, [id number]- Manual therapy, [id number]- Gait training, and [phone]- Aquatic  Therapy  PLAN FOR NEXT SESSION: functional strength and balance, L knee ROM. How does heel lift feel? Any updates about shoulder surgery?   Josette Rough, PT, DPT 11/27/2024 11:44 AM     Date of referral: 09/19/24 Referring provider: Arnaldo Juliene RAMAN, MD Referring diagnosis? M25.561 (ICD-10-CM) - Pain in right knee Treatment diagnosis? (if different than referring diagnosis)   M25.561, G89.29, M25.662, M62.81, R29.898  What was this (referring dx) caused by? Ongoing Issue  Lysle of Condition: Chronic (continuous duration > 3 months)   Laterality: Rt  Current Functional Measure Score: Patient Specific Functional Scale 4  Objective measurements identify impairments when they are compared to normal values, the uninvolved extremity, and prior level of function.  [x]  Yes  []  No  Objective assessment of functional ability: Minimal functional limitations   Briefly describe symptoms: ongoing chronic pain in R knee   How did symptoms start: over time   Average pain intensity:  Last 24 hours: 4/10  Past week: 4/10  How often does the pt experience symptoms? Frequently  How much have the symptoms interfered with usual daily activities? A little bit  How has condition changed since care began at this facility? NA - initial visit  In general, how is the patients overall health? Very Good   BACK PAIN (STarT Back Screening Tool) No

## 2024-12-02 ENCOUNTER — Ambulatory Visit (HOSPITAL_BASED_OUTPATIENT_CLINIC_OR_DEPARTMENT_OTHER)

## 2024-12-02 ENCOUNTER — Encounter (HOSPITAL_BASED_OUTPATIENT_CLINIC_OR_DEPARTMENT_OTHER): Payer: Self-pay

## 2024-12-02 DIAGNOSIS — G8929 Other chronic pain: Secondary | ICD-10-CM

## 2024-12-02 DIAGNOSIS — M25561 Pain in right knee: Secondary | ICD-10-CM | POA: Diagnosis not present

## 2024-12-02 DIAGNOSIS — M25662 Stiffness of left knee, not elsewhere classified: Secondary | ICD-10-CM

## 2024-12-02 DIAGNOSIS — M6281 Muscle weakness (generalized): Secondary | ICD-10-CM

## 2024-12-02 DIAGNOSIS — R29898 Other symptoms and signs involving the musculoskeletal system: Secondary | ICD-10-CM

## 2024-12-02 NOTE — Therapy (Signed)
 OUTPATIENT PHYSICAL THERAPY LOWER EXTREMITY TREATMENT   Patient Name: Shawna Gonzalez MRN: [id number] DOB:[birth year], 73 y.o., female Today's Date: 12/02/2024  END OF SESSION:  PT End of Session - 12/02/24 1355     Visit Number 6    Number of Visits 17    Date for Recertification  01/06/25    Authorization Type [employer] MCR    Authorization Time Period 11/11/24 to 01/06/25    Authorization - Visit Number 5    Authorization - Number of Visits 16    Progress Note Due on Visit 10    PT Start Time 1350    PT Stop Time 1430    PT Time Calculation (min) 40 min    Activity Tolerance Patient tolerated treatment well    Behavior During Therapy WFL for tasks assessed/performed               Past Medical History:  Diagnosis Date   Adenomatous colon polyp    Anemia    history of   Arthritis    Asthma    Atypical chest pain    Bacterial ear infection 2018   Cancer (HCC)    melanoma skin cancer 7 years ago   Cataract    Diabetes mellitus without complication (HCC)    history of taking diabetic medication, has lost weight no longer needing medciation at this time   GERD (gastroesophageal reflux disease)    Glaucoma    Heart murmur    slight   Hypertension    Macular degeneration    Osteoporosis    Sleep apnea    cpap   Ulcerative colitis, chronic (HCC)    Past Surgical History:  Procedure Laterality Date   CARPAL TUNNEL RELEASE Right 12/16/2009   CATARACT EXTRACTION W/ INTRAOCULAR LENS  IMPLANT, BILATERAL Bilateral 05/21/2007(Right); 06/04/2007(Left)   CHOLECYSTECTOMY  2000   COLONOSCOPY WITH PROPOFOL   01/23/2012   DE QUERVAIN'S RELEASE Right 11/30/2005   HYSTERECTOMY ABDOMINAL WITH SALPINGECTOMY Bilateral 01/09/2017   Procedure: HYSTERECTOMY ABDOMINAL WITH SALPINGOOPHORECTOMY;  Surgeon: Rosaline Cobble, MD;  Location: [facility];  Service: Gynecology;  Laterality: Bilateral;   TONSILLECTOMY     TRIGGER FINGER RELEASE Right    UPPER GI ENDOSCOPY  03/05/2017   MAC    Patient Active Problem List   Diagnosis Date Noted   Obesity hypoventilation syndrome (HCC) 02/01/2021   Morbid obesity with BMI of 40.0-44.9, adult (HCC) 02/01/2021   Displacement of lumbar intervertebral disc without myelopathy 05/19/2019   Lumbosacral spondylosis without myelopathy 05/05/2019   Trochanteric bursitis of left hip 04/02/2019   Pain of left hip joint 04/01/2019   Adhesive capsulitis of left shoulder 03/12/2019   Pain of left hand 04/23/2018   Low back pain 02/06/2018   Nocturnal hypoxemia 01/24/2018   Insomnia secondary to chronic pain 01/24/2018   OAG (open angle glaucoma) suspect, high risk, bilateral 01/03/2018   Abdominal pain, epigastric 02/28/2017   S/P TAH-BSO (total abdominal hysterectomy and bilateral salpingo-oophorectomy) 01/09/2017   Bloating 10/12/2016   Generalized abdominal pain 10/12/2016   Nausea without vomiting 10/12/2016   Essential hypertension 01/26/2016   Atypical chest pain 01/26/2016   OSA on CPAP 01/18/2016   Hypoxemia 01/18/2016   Exudative age-related macular degeneration, left eye, with active choroidal neovascularization (HCC) 10/11/2015   Cystoid macular edema of left eye 09/09/2015   Fundus coloboma, bilateral 09/09/2015   Asthma, chronic 08/04/2015   Obesity, morbid (HCC) 08/04/2015   Respiratory failure with hypoxia and hypercapnia (HCC) 08/04/2015   Chronic cough 05/22/2012  ULCERATIVE COLITIS, UNIVERSAL 04/10/2008   ANEMIA, IRON DEFICIENCY, HX OF 04/10/2008   Diaphragmatic hernia 05/16/2007    PCP: Shayne Anes, MD   REFERRING PROVIDER: Arnaldo Juliene RAMAN, MD  REFERRING DIAG: [phone] (ICD-10-CM) - Pain in right knee  THERAPY DIAG:  Other symptoms and signs involving the musculoskeletal system  Stiffness of left knee, not elsewhere classified  Chronic pain of right knee  Muscle weakness (generalized)  Rationale for Evaluation and Treatment: Rehabilitation  ONSET DATE: chronic   SUBJECTIVE:   SUBJECTIVE  STATEMENT:  Pt reports she meets with  MD January 5th to discuss R shoulder.   Eval: Went to [place] and [place] about 23 days ago (walked 24K steps a day), had shoulder pain before the trip, knee is not feeling good now and had a fall last night too getting my foot caught on something in the kitchen. Usually end up falling on the knee. Have severe osteoporosis, have been getting injections for awhile.   PERTINENT HISTORY: See above  PAIN:  Are you having pain? Yes: NPRS scale: 0/10 R  Pain location: R knee  Pain description: its there, can have some odd popping or pain in the middle of the kneecap too  Aggravating factors: falling on it, moving knee after its been stiff for awhile   Relieving factors: movement   PRECAUTIONS: Fall and Other: legally blind   RED FLAGS: None   WEIGHT BEARING RESTRICTIONS: No  FALLS:  Has patient fallen in last 6 months? Yes. Number of falls 3, FOF (+)  LIVING ENVIRONMENT: Lives with: lives alone Lives in: House/apartment   OCCUPATION: retired  PLOF: Independent, Independent with basic ADLs, Independent with gait, and Independent with transfers  PATIENT GOALS: less knee stiffness, less night pain, stay mobile   NEXT MD VISIT: PRN with referring   OBJECTIVE:  Note: Objective measures were completed at Evaluation unless otherwise noted.   PATIENT SURVEYS:  PSFS: THE PATIENT SPECIFIC FUNCTIONAL SCALE  Place score of 0-10 (0 = unable to perform activity and 10 = able to perform activity at the same level as before injury or problem)  Activity Date: 11/11/24 Eval     Being in one position for a long time  3    2. Steps  5    3.     4.      Total Score 4      Total Score = Sum of activity scores/number of activities  Minimally Detectable Change: 3 points (for single activity); 2 points (for average score)  [facility] (nd). The Patient Specific Functional Scale . Retrieved from  [email or web address]   COGNITION: Overall cognitive status: Within functional limits for tasks assessed      MUSCLE LENGTH:  B patellas hypomobile, L>R  HS WNL Piriformis 40% limitation B       LOWER EXTREMITY ROM:  Active ROM Right eval Left eval  Hip flexion    Hip extension    Hip abduction    Hip adduction    Hip internal rotation    Hip external rotation    Knee flexion -2* 3*  Knee extension 122* 108*  Ankle dorsiflexion    Ankle plantarflexion    Ankle inversion    Ankle eversion     (Blank rows = not tested)  LOWER EXTREMITY MMT:  MMT Right eval Left eval  Hip flexion 3 3  Hip extension    Hip abduction 3 3  Hip adduction    Hip internal rotation  Hip external rotation    Knee flexion 4+ 4+  Knee extension 4 4-  Ankle dorsiflexion    Ankle plantarflexion    Ankle inversion    Ankle eversion     (Blank rows = not tested)                                                                                                                                  TREATMENT DATE:   12/16 Sci fit bike L3 x37min Bridge with abd GTB 2x10 S/l abduction 2x15ea Hip hikes off 2 step 2x10ea Gait in hall x132ft Hip hike with abduction 4x10ea of 2 step Standing marching with cues for decreasing hip drop 2x10 Step up 6 with cues for [facility] of glutes 2x10ea       11/27/24  Scifit bike L3, seat 1 x8 min   Bridges + ABD into blue TB x12 Sidelying clams blue TB x12  Walking bridges x8 Checked for LLD, SI MET for LLD with no effect L LE about 1/4 inch longer placed shoe lift in R (one layer peeled off)- educated to take one layer off if pain increases, and take out completely if she still has increased pain after that  Hip hikes x12 B Hip hikes + ABD x10 B Hip hikes + swing x10 B    12/9  LAQ 3x10bil 3# SLR 2x10ea Bridge 3x10 Hip adduction sqz in hooklying 5 3x10 Hooklying clam GTB 2x30 S/l  hip abduction 4x10ea Hip hikes 4 x20ea Standing hip extension 0# 2x10bil- cues for PPT/core activation Partial squats 2x10 at rail Gait in hall to observe hip drop Standing marching with cues to correct hip drop    12/5  LAQ 3x10bil 3# SLR 3x10ea Bridge 3x10 Hip adduction sqz in hooklying 5 3x10 Hooklying clam GTB x30 S/l hip abduction 3x10ea Standing marching 2# 2x10 Standing hip abduction 2# 2x10bil Standing hip extension 2# 2x10bil Partial squats 2x10 at rail Gait observation- sway to L side, knee hyperextension Walking marching SLS Education on trendelenberg sign and awareness Recumbent bike seat 0 L3 x30min  12/1  LAQ 2x10R 3# SLR 2x10ea Bridge 2x10 Hip adduction sqz in hooklying 5 2x10 Hooklying clam GTB x30 S/l hip abduction 2x10ea Standing marching 2# 2x10 Standing hip abduction 2# 2x10bil Standing hip extension 2# 2x10bil Partial squats 2x10 at rail   11/11/24  Eval POC HEP      PATIENT EDUCATION:  Education details: exam findings, POC, HEP  Person educated: Patient Education method: Explanation, Demonstration, and Handouts Education comprehension: verbalized understanding and returned demonstration  HOME EXERCISE PROGRAM: Access Code: [email or web address] URL: [email or web address] Date: 11/11/2024 Prepared by: Josette Rough  Exercises - Supine Active Straight Leg Raise  - 1 x daily - 5 x weekly - 2 sets - 10 reps - Supine Bridge with Resistance Band  - 1 x daily - 5 x weekly - 2 sets - 5-10 reps - Seated Knee Extension with Anchored Resistance  -  1 x daily - 5 x weekly - 2 sets - 10 reps  ASSESSMENT:  CLINICAL IMPRESSION:   Significant improvement in gait quality since addition of heel lift lst session. Continued to work on psychiatrist and [facility]. Pt responded well to all interventions. Cuing required for proper technique and positioning with exercises. Will progress towards independent HEP.   Eval: Patient is a 73 y.o.  F who was seen today for physical therapy evaluation and treatment for M25.561 (ICD-10-CM) - Pain in right knee. Objectives as above, will make every effort to improve level of function and pain.   OBJECTIVE IMPAIRMENTS: Abnormal gait, decreased activity tolerance, decreased balance, decreased mobility, difficulty walking, decreased ROM, decreased strength, increased fascial restrictions, obesity, and pain.   ACTIVITY LIMITATIONS: standing, squatting, sleeping, stairs, transfers, and locomotion level  PARTICIPATION LIMITATIONS: meal prep, cleaning, laundry, shopping, community activity, and yard work  PERSONAL FACTORS: Age, Education, Fitness, Past/current experiences, Social background, and Time since onset of injury/illness/exacerbation are also affecting patient's functional outcome.   REHAB POTENTIAL: Good  CLINICAL DECISION MAKING: Stable/uncomplicated  EVALUATION COMPLEXITY: Low   GOALS: Goals reviewed with patient? No  SHORT TERM GOALS: Target date: 12/09/2024    Will be compliant with appropriate progressive HEP Goal status: MET 12/9   2. L knee AROM flexion to match that of the R knee without pain  Goal status: initial   3. Sx and pain to have improved by at least 25% Goal status: initial    LONG TERM GOALS: Target date: 01/06/2025    MMT to have improved by one grade all weak groups Goal status: initial   2. Will score at least 20 on the DGI to show reduced fall risk Goal status: initial    3. Will be able to ascend and descend stairs reciprocally with no increase in pain Goal status: initial   4. Will be able to sleep through the night without increased knee pain upon waking Goal status: lniital   5. PSFS  to have improved by at least 3  points to show improved QOL and subjective improvement     PLAN:  PT FREQUENCY: 2x/week  PT DURATION: 8 weeks  PLANNED INTERVENTIONS: 97164- PT Re-evaluation, 97750- Physical Performance Testing,  97110-Therapeutic exercises, 97530- Therapeutic activity, [id number]- Neuromuscular re-education, 97535- Self Care, [id number]- Manual therapy, [id number]- Gait training, and [phone]- Aquatic Therapy  PLAN FOR NEXT SESSION: functional strength and balance, L knee ROM. How does heel lift feel? Any updates about shoulder surgery?   Asberry Rodes, PTA  12/02/2024 3:07 PM     Date of referral: 09/19/24 Referring provider: Arnaldo Juliene RAMAN, MD Referring diagnosis? M25.561 (ICD-10-CM) - Pain in right knee Treatment diagnosis? (if different than referring diagnosis)   M25.561, G89.29, M25.662, M62.81, R29.898  What was this (referring dx) caused by? Ongoing Issue  Lysle of Condition: Chronic (continuous duration > 3 months)   Laterality: Rt  Current Functional Measure Score: Patient Specific Functional Scale 4  Objective measurements identify impairments when they are compared to normal values, the uninvolved extremity, and prior level of function.  [x]  Yes  []  No  Objective assessment of functional ability: Minimal functional limitations   Briefly describe symptoms: ongoing chronic pain in R knee   How did symptoms start: over time   Average pain intensity:  Last 24 hours: 4/10  Past week: 4/10  How often does the pt experience symptoms? Frequently  How much have the symptoms interfered with usual daily activities? A little bit  How has condition changed since care began at this facility? NA - initial visit  In general, how is the patients overall health? Very Good   BACK PAIN (STarT Back Screening Tool) No

## 2024-12-05 ENCOUNTER — Encounter (HOSPITAL_BASED_OUTPATIENT_CLINIC_OR_DEPARTMENT_OTHER): Payer: Self-pay

## 2024-12-05 ENCOUNTER — Ambulatory Visit (HOSPITAL_BASED_OUTPATIENT_CLINIC_OR_DEPARTMENT_OTHER)

## 2024-12-05 DIAGNOSIS — M6281 Muscle weakness (generalized): Secondary | ICD-10-CM

## 2024-12-05 DIAGNOSIS — R29898 Other symptoms and signs involving the musculoskeletal system: Secondary | ICD-10-CM

## 2024-12-05 DIAGNOSIS — M25561 Pain in right knee: Secondary | ICD-10-CM | POA: Diagnosis not present

## 2024-12-05 DIAGNOSIS — M25662 Stiffness of left knee, not elsewhere classified: Secondary | ICD-10-CM

## 2024-12-05 DIAGNOSIS — G8929 Other chronic pain: Secondary | ICD-10-CM

## 2024-12-05 NOTE — Therapy (Signed)
 " OUTPATIENT PHYSICAL THERAPY LOWER EXTREMITY TREATMENT   Patient Name: Shawna Gonzalez MRN: [id number] DOB:May 02, 1951, 73 y.o., female Today's Date: 12/05/2024  END OF SESSION:  PT End of Session - 12/05/24 0927     Visit Number 7    Number of Visits 17    Date for Recertification  01/06/25    Authorization Type [employer] MCR    Authorization Time Period 11/11/24 to 01/06/25    Authorization - Visit Number 6    Authorization - Number of Visits 16    Progress Note Due on Visit 10    PT Start Time 0927    PT Stop Time 1014    PT Time Calculation (min) 47 min    Activity Tolerance Patient tolerated treatment well    Behavior During Therapy [facility] for tasks assessed/performed                Past Medical History:  Diagnosis Date   Adenomatous colon polyp    Anemia    history of   Arthritis    Asthma    Atypical chest pain    Bacterial ear infection 2018   Cancer (HCC)    melanoma skin cancer 7 years ago   Cataract    Diabetes mellitus without complication (HCC)    history of taking diabetic medication, has lost weight no longer needing medciation at this time   GERD (gastroesophageal reflux disease)    Glaucoma    Heart murmur    slight   Hypertension    Macular degeneration    Osteoporosis    Sleep apnea    cpap   Ulcerative colitis, chronic (HCC)    Past Surgical History:  Procedure Laterality Date   CARPAL TUNNEL RELEASE Right 12/16/2009   CATARACT EXTRACTION W/ INTRAOCULAR LENS  IMPLANT, BILATERAL Bilateral 05/21/2007(Right); 06/04/2007(Left)   CHOLECYSTECTOMY  2000   COLONOSCOPY WITH PROPOFOL   01/23/2012   DE QUERVAIN'S RELEASE Right 11/30/2005   HYSTERECTOMY ABDOMINAL WITH SALPINGECTOMY Bilateral 01/09/2017   Procedure: HYSTERECTOMY ABDOMINAL WITH SALPINGOOPHORECTOMY;  Surgeon: Rosaline Cobble, MD;  Location: [facility];  Service: Gynecology;  Laterality: Bilateral;   TONSILLECTOMY     TRIGGER FINGER RELEASE Right    UPPER GI ENDOSCOPY  03/05/2017   MAC    Patient Active Problem List   Diagnosis Date Noted   Obesity hypoventilation syndrome (HCC) 02/01/2021   Morbid obesity with BMI of 40.0-44.9, adult (HCC) 02/01/2021   Displacement of lumbar intervertebral disc without myelopathy 05/19/2019   Lumbosacral spondylosis without myelopathy 05/05/2019   Trochanteric bursitis of left hip 04/02/2019   Pain of left hip joint 04/01/2019   Adhesive capsulitis of left shoulder 03/12/2019   Pain of left hand 04/23/2018   Low back pain 02/06/2018   Nocturnal hypoxemia 01/24/2018   Insomnia secondary to chronic pain 01/24/2018   OAG (open angle glaucoma) suspect, high risk, bilateral 01/03/2018   Abdominal pain, epigastric 02/28/2017   S/P TAH-BSO (total abdominal hysterectomy and bilateral salpingo-oophorectomy) 01/09/2017   Bloating 10/12/2016   Generalized abdominal pain 10/12/2016   Nausea without vomiting 10/12/2016   Essential hypertension 01/26/2016   Atypical chest pain 01/26/2016   OSA on CPAP 01/18/2016   Hypoxemia 01/18/2016   Exudative age-related macular degeneration, left eye, with active choroidal neovascularization (HCC) 10/11/2015   Cystoid macular edema of left eye 09/09/2015   Fundus coloboma, bilateral 09/09/2015   Asthma, chronic 08/04/2015   Obesity, morbid (HCC) 08/04/2015   Respiratory failure with hypoxia and hypercapnia (HCC) 08/04/2015   Chronic  cough 05/22/2012   ULCERATIVE COLITIS, UNIVERSAL 04/10/2008   ANEMIA, IRON DEFICIENCY, HX OF 04/10/2008   Diaphragmatic hernia 05/16/2007    PCP: Shayne Anes, MD   REFERRING PROVIDER: Arnaldo Juliene RAMAN, MD  REFERRING DIAG: [id number] (ICD-10-CM) - Pain in right knee  THERAPY DIAG:  Other symptoms and signs involving the musculoskeletal system  Stiffness of left knee, not elsewhere classified  Chronic pain of right knee  Muscle weakness (generalized)  Rationale for Evaluation and Treatment: Rehabilitation  ONSET DATE: chronic   SUBJECTIVE:   SUBJECTIVE  STATEMENT:  Pt reports she meets with  MD January 5th to discuss R shoulder. Can't sleep due to shoulder pain.  Knee does pretty good but has it's moments.   Eval: Went to [place] and [place] about 23 days ago (walked 24K steps a day), had shoulder pain before the trip, knee is not feeling good now and had a fall last night too getting my foot caught on something in the kitchen. Usually end up falling on the knee. Have severe osteoporosis, have been getting injections for awhile.   PERTINENT HISTORY: See above  PAIN:  Are you having pain? Yes: NPRS scale: 0/10 R  Pain location: R knee  Pain description: its there, can have some odd popping or pain in the middle of the kneecap too  Aggravating factors: falling on it, moving knee after its been stiff for awhile   Relieving factors: movement   PRECAUTIONS: Fall and Other: legally blind   RED FLAGS: None   WEIGHT BEARING RESTRICTIONS: No  FALLS:  Has patient fallen in last 6 months? Yes. Number of falls 3, FOF (+)  LIVING ENVIRONMENT: Lives with: lives alone Lives in: House/apartment   OCCUPATION: retired  PLOF: Independent, Independent with basic ADLs, Independent with gait, and Independent with transfers  PATIENT GOALS: less knee stiffness, less night pain, stay mobile   NEXT MD VISIT: PRN with referring   OBJECTIVE:  Note: Objective measures were completed at Evaluation unless otherwise noted.   PATIENT SURVEYS:  PSFS: THE PATIENT SPECIFIC FUNCTIONAL SCALE  Place score of 0-10 (0 = unable to perform activity and 10 = able to perform activity at the same level as before injury or problem)  Activity Date: 11/11/24 Eval     Being in one position for a long time  3    2. Steps  5    3.     4.      Total Score 4      Total Score = Sum of activity scores/number of activities  Minimally Detectable Change: 3 points (for single activity); 2 points (for average score)  [facility] (nd). The Patient  Specific Functional Scale . Retrieved from [email or web address]   COGNITION: Overall cognitive status: Within functional limits for tasks assessed      MUSCLE LENGTH:  B patellas hypomobile, L>R  HS WNL Piriformis 40% limitation B       LOWER EXTREMITY ROM:  Active ROM Right eval Left eval R 12/19 L 12/19  Hip flexion      Hip extension      Hip abduction      Hip adduction      Hip internal rotation      Hip external rotation      Knee flexion -2* 3* 129 123  Knee extension 122* 108* 3deg hyperextension 3deg hyperextension  Ankle dorsiflexion      Ankle plantarflexion      Ankle inversion  Ankle eversion       (Blank rows = not tested)  LOWER EXTREMITY MMT:  MMT Right eval Left eval  Hip flexion 3 3  Hip extension    Hip abduction 3 3  Hip adduction    Hip internal rotation    Hip external rotation    Knee flexion 4+ 4+  Knee extension 4 4-  Ankle dorsiflexion    Ankle plantarflexion    Ankle inversion    Ankle eversion     (Blank rows = not tested)                                                                                                                                  TREATMENT DATE:   12/19 Sci fit bike L3 x18min Bridge with abd GTB 2x15 Updated ROM S/l abduction 2x15ea Hip hikes off 2 step 2x10ea Gait in hall x136ft Hip hike with abduction 4x10ea of 2 step Standing marching with cues for decreasing hip drop 2x10 Step up 6 with cues for [facility] of glutes 2x10ea Stair climbing full flight reciprocal 1 hand rail STS lowered plinht x30   12/16 Sci fit bike L3 x93min Bridge with abd GTB 2x10 S/l abduction 2x15ea Hip hikes off 2 step 2x10ea Gait in hall x115ft Hip hike with abduction 4x10ea of 2 step Standing marching with cues for decreasing hip drop 2x10 Step up 6 with cues for [facility] of glutes 2x10ea       11/27/24  Scifit bike L3, seat 1 x8 min   Bridges + ABD  into blue TB x12 Sidelying clams blue TB x12  Walking bridges x8 Checked for LLD, SI MET for LLD with no effect L LE about 1/4 inch longer placed shoe lift in R (one layer peeled off)- educated to take one layer off if pain increases, and take out completely if she still has increased pain after that  Hip hikes x12 B Hip hikes + ABD x10 B Hip hikes + swing x10 B    12/9  LAQ 3x10bil 3# SLR 2x10ea Bridge 3x10 Hip adduction sqz in hooklying 5 3x10 Hooklying clam GTB 2x30 S/l hip abduction 4x10ea Hip hikes 4 x20ea Standing hip extension 0# 2x10bil- cues for PPT/core activation Partial squats 2x10 at rail Gait in hall to observe hip drop Standing marching with cues to correct hip drop    12/5  LAQ 3x10bil 3# SLR 3x10ea Bridge 3x10 Hip adduction sqz in hooklying 5 3x10 Hooklying clam GTB x30 S/l hip abduction 3x10ea Standing marching 2# 2x10 Standing hip abduction 2# 2x10bil Standing hip extension 2# 2x10bil Partial squats 2x10 at rail Gait observation- sway to L side, knee hyperextension Walking marching SLS Education on trendelenberg sign and awareness Recumbent bike seat 0 L3 x3min  12/1  LAQ 2x10R 3# SLR 2x10ea Bridge 2x10 Hip adduction sqz in hooklying 5 2x10 Hooklying clam GTB x30 S/l hip abduction 2x10ea Standing marching 2# 2x10 Standing hip  abduction 2# 2x10bil Standing hip extension 2# 2x10bil Partial squats 2x10 at rail   11/11/24  Eval POC HEP      PATIENT EDUCATION:  Education details: exam findings, POC, HEP  Person educated: Patient Education method: Explanation, Demonstration, and Handouts Education comprehension: verbalized understanding and returned demonstration  HOME EXERCISE PROGRAM: Access Code: [email or web address] URL: [email or web address] Date: 11/11/2024 Prepared by: Josette Rough  Exercises - Supine Active Straight Leg Raise  - 1 x daily - 5 x weekly - 2 sets - 10 reps - Supine Bridge with Resistance Band  - 1  x daily - 5 x weekly - 2 sets - 5-10 reps - Seated Knee Extension with Anchored Resistance  - 1 x daily - 5 x weekly - 2 sets - 10 reps  ASSESSMENT:  CLINICAL IMPRESSION:  Continued to progress hip strengthening to improve functional strength with walking, stair climbing, and squatting. Updated bil knee ROM reflects significant improvement since IE. Pt able to climb flight of stairs in clinic with reciprocal pattern and single UE support. With gait and standing marches, pt demonstrates significant decrease in pelvic obliquity due to heel lift. Pt making excellent progress towards goals.   Eval: Patient is a 73 y.o. F who was seen today for physical therapy evaluation and treatment for M25.561 (ICD-10-CM) - Pain in right knee. Objectives as above, will make every effort to improve level of function and pain.   OBJECTIVE IMPAIRMENTS: Abnormal gait, decreased activity tolerance, decreased balance, decreased mobility, difficulty walking, decreased ROM, decreased strength, increased fascial restrictions, obesity, and pain.   ACTIVITY LIMITATIONS: standing, squatting, sleeping, stairs, transfers, and locomotion level  PARTICIPATION LIMITATIONS: meal prep, cleaning, laundry, shopping, community activity, and yard work  PERSONAL FACTORS: Age, Education, Fitness, Past/current experiences, Social background, and Time since onset of injury/illness/exacerbation are also affecting patient's functional outcome.   REHAB POTENTIAL: Good  CLINICAL DECISION MAKING: Stable/uncomplicated  EVALUATION COMPLEXITY: Low   GOALS: Goals reviewed with patient? No  SHORT TERM GOALS: Target date: 12/09/2024    Will be compliant with appropriate progressive HEP Goal status: MET 12/9   2. L knee AROM flexion to match that of the R knee without pain  Goal status: IN PROGRESS 12/19   3. Sx and pain to have improved by at least 25% Goal status: IN PROGRESS 12/19   LONG TERM GOALS: Target date: 01/06/2025     MMT to have improved by one grade all weak groups Goal status: initial   2. Will score at least 20 on the DGI to show reduced fall risk Goal status: initial    3. Will be able to ascend and descend stairs reciprocally with no increase in pain Goal status: initial   4. Will be able to sleep through the night without increased knee pain upon waking Goal status: IN PROGRESS 12/19   5. PSFS  to have improved by at least 3  points to show improved QOL and subjective improvement     PLAN:  PT FREQUENCY: 2x/week  PT DURATION: 8 weeks  PLANNED INTERVENTIONS: [id number]- PT Re-evaluation, 97750- Physical Performance Testing, 97110-Therapeutic exercises, 97530- Therapeutic activity, [id number]- Neuromuscular re-education, 97535- Self Care, [id number]- Manual therapy, [id number]- Gait training, and [phone]- Aquatic Therapy  PLAN FOR NEXT SESSION: functional strength and balance, L knee ROM. How does heel lift feel? Any updates about shoulder surgery?   Asberry Rodes, PTA  12/05/2024 10:19 AM     Date of referral: 09/19/24 Referring provider: Arnaldo Juliene RAMAN, MD Referring diagnosis? M25.561 (ICD-10-CM) -  Pain in right knee Treatment diagnosis? (if different than referring diagnosis)   M25.561, G89.29, M25.662, M62.81, R29.898  What was this (referring dx) caused by? Ongoing Issue  Lysle of Condition: Chronic (continuous duration > 3 months)   Laterality: Rt  Current Functional Measure Score: Patient Specific Functional Scale 4  Objective measurements identify impairments when they are compared to normal values, the uninvolved extremity, and prior level of function.  [x]  Yes  []  No  Objective assessment of functional ability: Minimal functional limitations   Briefly describe symptoms: ongoing chronic pain in R knee   How did symptoms start: over time   Average pain intensity:  Last 24 hours: 4/10  Past week: 4/10  How often does the pt experience symptoms? Frequently  How much  have the symptoms interfered with usual daily activities? A little bit  How has condition changed since care began at this facility? NA - initial visit  In general, how is the patients overall health? Very Good   BACK PAIN (STarT Back Screening Tool) No  "

## 2024-12-08 ENCOUNTER — Encounter (HOSPITAL_BASED_OUTPATIENT_CLINIC_OR_DEPARTMENT_OTHER): Payer: Self-pay | Admitting: Physical Therapy

## 2024-12-08 ENCOUNTER — Ambulatory Visit (HOSPITAL_BASED_OUTPATIENT_CLINIC_OR_DEPARTMENT_OTHER): Admitting: Physical Therapy

## 2024-12-08 DIAGNOSIS — M6281 Muscle weakness (generalized): Secondary | ICD-10-CM

## 2024-12-08 DIAGNOSIS — M25561 Pain in right knee: Secondary | ICD-10-CM | POA: Diagnosis not present

## 2024-12-08 DIAGNOSIS — M25662 Stiffness of left knee, not elsewhere classified: Secondary | ICD-10-CM

## 2024-12-08 DIAGNOSIS — G8929 Other chronic pain: Secondary | ICD-10-CM

## 2024-12-08 DIAGNOSIS — R29898 Other symptoms and signs involving the musculoskeletal system: Secondary | ICD-10-CM

## 2024-12-08 NOTE — Therapy (Signed)
 " OUTPATIENT PHYSICAL THERAPY LOWER EXTREMITY PROGRESS NOTE/RECERT    Patient Name: Shawna Gonzalez MRN: [id number] DOB:June 04, 1951, 73 y.o., female Today's Date: 12/08/2024  Progress Note Reporting Period 11/11/24 to 12/08/24  See note below for Objective Data and Assessment of Progress/Goals.         END OF SESSION:  PT End of Session - 12/08/24 1548     Visit Number 8    Number of Visits 17    Date for Recertification  01/19/25    Authorization Type [employer] MCR    Authorization Time Period 11/11/24 to 01/06/25    Authorization - Visit Number 7    Authorization - Number of Visits 16    Progress Note Due on Visit 18   done visit 8   PT Start Time 1517    PT Stop Time 1556    PT Time Calculation (min) 39 min    Activity Tolerance Patient tolerated treatment well    Behavior During Therapy WFL for tasks assessed/performed                 Past Medical History:  Diagnosis Date   Adenomatous colon polyp    Anemia    history of   Arthritis    Asthma    Atypical chest pain    Bacterial ear infection 2018   Cancer (HCC)    melanoma skin cancer 7 years ago   Cataract    Diabetes mellitus without complication (HCC)    history of taking diabetic medication, has lost weight no longer needing medciation at this time   GERD (gastroesophageal reflux disease)    Glaucoma    Heart murmur    slight   Hypertension    Macular degeneration    Osteoporosis    Sleep apnea    cpap   Ulcerative colitis, chronic (HCC)    Past Surgical History:  Procedure Laterality Date   CARPAL TUNNEL RELEASE Right 12/16/2009   CATARACT EXTRACTION W/ INTRAOCULAR LENS  IMPLANT, BILATERAL Bilateral 05/21/2007(Right); 06/04/2007(Left)   CHOLECYSTECTOMY  2000   COLONOSCOPY WITH PROPOFOL   01/23/2012   DE QUERVAIN'S RELEASE Right 11/30/2005   HYSTERECTOMY ABDOMINAL WITH SALPINGECTOMY Bilateral 01/09/2017   Procedure: HYSTERECTOMY ABDOMINAL WITH SALPINGOOPHORECTOMY;  Surgeon: Rosaline Cobble, MD;  Location: [facility];  Service: Gynecology;  Laterality: Bilateral;   TONSILLECTOMY     TRIGGER FINGER RELEASE Right    UPPER GI ENDOSCOPY  03/05/2017   MAC   Patient Active Problem List   Diagnosis Date Noted   Obesity hypoventilation syndrome (HCC) 02/01/2021   Morbid obesity with BMI of 40.0-44.9, adult (HCC) 02/01/2021   Displacement of lumbar intervertebral disc without myelopathy 05/19/2019   Lumbosacral spondylosis without myelopathy 05/05/2019   Trochanteric bursitis of left hip 04/02/2019   Pain of left hip joint 04/01/2019   Adhesive capsulitis of left shoulder 03/12/2019   Pain of left hand 04/23/2018   Low back pain 02/06/2018   Nocturnal hypoxemia 01/24/2018   Insomnia secondary to chronic pain 01/24/2018   OAG (open angle glaucoma) suspect, high risk, bilateral 01/03/2018   Abdominal pain, epigastric 02/28/2017   S/P TAH-BSO (total abdominal hysterectomy and bilateral salpingo-oophorectomy) 01/09/2017   Bloating 10/12/2016   Generalized abdominal pain 10/12/2016   Nausea without vomiting 10/12/2016   Essential hypertension 01/26/2016   Atypical chest pain 01/26/2016   OSA on CPAP 01/18/2016   Hypoxemia 01/18/2016   Exudative age-related macular degeneration, left eye, with active choroidal neovascularization (HCC) 10/11/2015   Cystoid macular edema of  left eye 09/09/2015   Fundus coloboma, bilateral 09/09/2015   Asthma, chronic 08/04/2015   Obesity, morbid (HCC) 08/04/2015   Respiratory failure with hypoxia and hypercapnia (HCC) 08/04/2015   Chronic cough 05/22/2012   ULCERATIVE COLITIS, UNIVERSAL 04/10/2008   ANEMIA, IRON DEFICIENCY, HX OF 04/10/2008   Diaphragmatic hernia 05/16/2007    PCP: Shayne Anes, MD   REFERRING PROVIDER: Arnaldo Juliene RAMAN, MD  REFERRING DIAG: [id number] (ICD-10-CM) - Pain in right knee  THERAPY DIAG:  Other symptoms and signs involving the musculoskeletal system  Stiffness of left knee, not elsewhere classified  Chronic  pain of right knee  Muscle weakness (generalized)  Rationale for Evaluation and Treatment: Rehabilitation  ONSET DATE: chronic   SUBJECTIVE:   SUBJECTIVE STATEMENT:  Knee is a little more sore, I accidentally hit it the other day in the kitchen getting down to the ground- went down a little harder than I meant to. Shoulder is getting to the point where its the worst its been, I've read that if you do nothing it can get worse and it can get worse, not sure what to do. Usually when I get an injection it helps, but it didn't help the shoulder this time.   Eval: Went to [place] and [place] about 23 days ago (walked 24K steps a day), had shoulder pain before the trip, knee is not feeling good now and had a fall last night too getting my foot caught on something in the kitchen. Usually end up falling on the knee. Have severe osteoporosis, have been getting injections for awhile.   PERTINENT HISTORY: See above  PAIN:  Are you having pain? Yes: NPRS scale: 2/10 R knee, 7/10 R shoulder  Pain location: above  Pain description: R knee general discomfort and some popping like kneecap does not move with my leg and they're not aligned; R shoulder is constant and like a toothache  Aggravating factors: knee: falling on it, moving knee after its been stiff for awhile; shoulder overdoing   Relieving factors: movement   PRECAUTIONS: Fall and Other: legally blind   RED FLAGS: None   WEIGHT BEARING RESTRICTIONS: No  FALLS:  Has patient fallen in last 6 months? Yes. Number of falls 3, FOF (+)  LIVING ENVIRONMENT: Lives with: lives alone Lives in: House/apartment   OCCUPATION: retired  PLOF: Independent, Independent with basic ADLs, Independent with gait, and Independent with transfers  PATIENT GOALS: less knee stiffness, less night pain, stay mobile   NEXT MD VISIT: PRN with referring   OBJECTIVE:  Note: Objective measures were completed at Evaluation unless otherwise noted.   PATIENT  SURVEYS:  PSFS: THE PATIENT SPECIFIC FUNCTIONAL SCALE  Place score of 0-10 (0 = unable to perform activity and 10 = able to perform activity at the same level as before injury or problem)  Activity Date: 11/11/24 Eval  12/08/24   Being in one position for a long time  3 3   2. Steps  5 5   3.     4.      Total Score 4 4     Total Score = Sum of activity scores/number of activities  Minimally Detectable Change: 3 points (for single activity); 2 points (for average score)  [facility] (nd). The Patient Specific Functional Scale . Retrieved from [email or web address]   COGNITION: Overall cognitive status: Within functional limits for tasks assessed      MUSCLE LENGTH:  B patellas hypomobile, L>R  HS WNL Piriformis 40% limitation B  12/08/24  Ongoing patellar hypomobility    LOWER EXTREMITY ROM:  Active ROM Right eval Left eval R 12/19 L 12/19  Hip flexion      Hip extension      Hip abduction      Hip adduction      Hip internal rotation      Hip external rotation      Knee flexion -2* 3* 129 123  Knee extension 122* 108* 3deg hyperextension 3deg hyperextension  Ankle dorsiflexion      Ankle plantarflexion      Ankle inversion      Ankle eversion       (Blank rows = not tested)  LOWER EXTREMITY MMT:  MMT Right eval Left eval Right 12/08/24 Left 12/08/24  Hip flexion 3 3 4+ 4+  Hip extension      Hip abduction 3 3 4- 4-  Hip adduction      Hip internal rotation      Hip external rotation      Knee flexion 4+ 4+ 5 5  Knee extension 4 4- 4+ 4+  Ankle dorsiflexion      Ankle plantarflexion      Ankle inversion      Ankle eversion       (Blank rows = not tested)                                                                                                                                  TREATMENT DATE:   12/08/24  Scifit seat 1 L4x6 minutes for w/u  Shuttle LE press 75# BLEs  x12 cues to not hyperextend knees  Shuttle LE press 37# x10 B Wide tandem blue foam pad 4x30 seconds alternating   PSFS, MMT, goals, care planning   Standing hip ABD x10 3# B Standing marches x10 B 3#  Hip hikes 3# x10 B LAQs 3# x12 B        PATIENT EDUCATION:  Education details: exam findings, POC, HEP  Person educated: Patient Education method: Programmer, Multimedia, Demonstration, and Handouts Education comprehension: verbalized understanding and returned demonstration  HOME EXERCISE PROGRAM: Access Code: [email or web address] URL: [email or web address] Date: 11/11/2024 Prepared by: Josette Rough  Exercises - Supine Active Straight Leg Raise  - 1 x daily - 5 x weekly - 2 sets - 10 reps - Supine Bridge with Resistance Band  - 1 x daily - 5 x weekly - 2 sets - 5-10 reps - Seated Knee Extension with Anchored Resistance  - 1 x daily - 5 x weekly - 2 sets - 10 reps  ASSESSMENT:  CLINICAL IMPRESSION:  Updated PSFS, MMT, and goals for progress note- she is making forward progress overall, although she does report good and bad days. Still has quite a bit of patella hypomobility which I think is contributing to sx, would benefit from ongoing work here. She sees the MD January 5th for POC for her shoulder, but would  benefit from extended PT care to keep addressing LE concerns. Extended POC/cert to accommodate and give us  some extra flexibility pending any potential shoulder surgeries/POC changes.   Eval: Patient is a 73 y.o. F who was seen today for physical therapy evaluation and treatment for M25.561 (ICD-10-CM) - Pain in right knee. Objectives as above, will make every effort to improve level of function and pain.   OBJECTIVE IMPAIRMENTS: Abnormal gait, decreased activity tolerance, decreased balance, decreased mobility, difficulty walking, decreased ROM, decreased strength, increased fascial restrictions, obesity, and pain.   ACTIVITY LIMITATIONS: standing, squatting, sleeping,  stairs, transfers, and locomotion level  PARTICIPATION LIMITATIONS: meal prep, cleaning, laundry, shopping, community activity, and yard work  PERSONAL FACTORS: Age, Education, Fitness, Past/current experiences, Social background, and Time since onset of injury/illness/exacerbation are also affecting patient's functional outcome.   REHAB POTENTIAL: Good  CLINICAL DECISION MAKING: Stable/uncomplicated  EVALUATION COMPLEXITY: Low   GOALS: Goals reviewed with patient? No  SHORT TERM GOALS: Target date: 12/29/2024      Will be compliant with appropriate progressive HEP Goal status: MET 12/9   2. L knee AROM flexion to match that of the R knee without pain  Goal status: IN PROGRESS 12/22   3. Sx and pain to have improved by at least 25% Goal status: IN PROGRESS 12/22   LONG TERM GOALS: Target date: 01/19/2025      MMT to have improved by one grade all weak groups Goal status: PARTIALLY MET 12/22   2. Will score at least 20 on the DGI to show reduced fall risk Goal status: ONGOING 12/22   3. Will be able to ascend and descend stairs reciprocally with no increase in pain Goal status: ONGOING 12/22   4. Will be able to sleep through the night without increased knee pain upon waking Goal status: IN PROGRESS 12/22   5. PSFS  to have improved by at least 3  points to show improved QOL and subjective improvement  Goal status: ONGOING 12/22   PLAN:  PT FREQUENCY: 2x/week  PT DURATION: 6 weeks  PLANNED INTERVENTIONS: 97164- PT Re-evaluation, 97750- Physical Performance Testing, 97110-Therapeutic exercises, 97530- Therapeutic activity, [id number]- Neuromuscular re-education, 97535- Self Care, [id number]- Manual therapy, [id number]- Gait training, and [phone]- Aquatic Therapy  PLAN FOR NEXT SESSION: functional strength and balance, L knee ROM.  Any updates about shoulder surgery? Teach self patella mobs   Josette Rough, PT, DPT 12/08/2024 4:01 PM      Date of referral:  09/19/24 Referring provider: Arnaldo Juliene RAMAN, MD Referring diagnosis? M25.561 (ICD-10-CM) - Pain in right knee Treatment diagnosis? (if different than referring diagnosis)   M25.561, G89.29, M25.662, M62.81, R29.898  What was this (referring dx) caused by? Ongoing Issue  Lysle of Condition: Chronic (continuous duration > 3 months)   Laterality: Rt  Current Functional Measure Score: Patient Specific Functional Scale 4  Objective measurements identify impairments when they are compared to normal values, the uninvolved extremity, and prior level of function.  [x]  Yes  []  No  Objective assessment of functional ability: Minimal functional limitations   Briefly describe symptoms: ongoing chronic pain in R knee   How did symptoms start: over time   Average pain intensity:  Last 24 hours: 4/10  Past week: 4/10  How often does the pt experience symptoms? Frequently  How much have the symptoms interfered with usual daily activities? A little bit  How has condition changed since care began at this facility? NA - initial visit  In general, how is  the patients overall health? Very Good   BACK PAIN (STarT Back Screening Tool) No  "

## 2024-12-10 ENCOUNTER — Encounter (HOSPITAL_BASED_OUTPATIENT_CLINIC_OR_DEPARTMENT_OTHER): Admitting: Physical Therapy

## 2024-12-15 ENCOUNTER — Ambulatory Visit (HOSPITAL_BASED_OUTPATIENT_CLINIC_OR_DEPARTMENT_OTHER): Admitting: Physical Therapy

## 2024-12-15 ENCOUNTER — Encounter (HOSPITAL_BASED_OUTPATIENT_CLINIC_OR_DEPARTMENT_OTHER): Payer: Self-pay | Admitting: Physical Therapy

## 2024-12-15 DIAGNOSIS — M6281 Muscle weakness (generalized): Secondary | ICD-10-CM

## 2024-12-15 DIAGNOSIS — G8929 Other chronic pain: Secondary | ICD-10-CM

## 2024-12-15 DIAGNOSIS — M25561 Pain in right knee: Secondary | ICD-10-CM | POA: Diagnosis not present

## 2024-12-15 DIAGNOSIS — R29898 Other symptoms and signs involving the musculoskeletal system: Secondary | ICD-10-CM

## 2024-12-15 DIAGNOSIS — M25662 Stiffness of left knee, not elsewhere classified: Secondary | ICD-10-CM

## 2024-12-15 NOTE — Therapy (Signed)
 " OUTPATIENT PHYSICAL THERAPY LOWER EXTREMITY TREATMENT   Patient Name: Shawna Gonzalez MRN: [id number] DOB:Apr 28, 1951, 73 y.o., female Today's Date: 12/15/2024         END OF SESSION:  PT End of Session - 12/15/24 1442     Visit Number 9    Number of Visits 17    Date for Recertification  01/19/25    Authorization Type [employer] MCR    Authorization Time Period 11/11/24 to 01/06/25    Authorization - Visit Number 8    Authorization - Number of Visits 16    Progress Note Due on Visit 18   progress note done early visit 8   PT Start Time 1433    PT Stop Time 1511    PT Time Calculation (min) 38 min    Activity Tolerance Patient tolerated treatment well    Behavior During Therapy WFL for tasks assessed/performed                  Past Medical History:  Diagnosis Date   Adenomatous colon polyp    Anemia    history of   Arthritis    Asthma    Atypical chest pain    Bacterial ear infection 2018   Cancer (HCC)    melanoma skin cancer 7 years ago   Cataract    Diabetes mellitus without complication (HCC)    history of taking diabetic medication, has lost weight no longer needing medciation at this time   GERD (gastroesophageal reflux disease)    Glaucoma    Heart murmur    slight   Hypertension    Macular degeneration    Osteoporosis    Sleep apnea    cpap   Ulcerative colitis, chronic (HCC)    Past Surgical History:  Procedure Laterality Date   CARPAL TUNNEL RELEASE Right 12/16/2009   CATARACT EXTRACTION W/ INTRAOCULAR LENS  IMPLANT, BILATERAL Bilateral 05/21/2007(Right); 06/04/2007(Left)   CHOLECYSTECTOMY  2000   COLONOSCOPY WITH PROPOFOL   01/23/2012   DE QUERVAIN'S RELEASE Right 11/30/2005   HYSTERECTOMY ABDOMINAL WITH SALPINGECTOMY Bilateral 01/09/2017   Procedure: HYSTERECTOMY ABDOMINAL WITH SALPINGOOPHORECTOMY;  Surgeon: Rosaline Cobble, MD;  Location: [facility];  Service: Gynecology;  Laterality: Bilateral;   TONSILLECTOMY     TRIGGER FINGER RELEASE  Right    UPPER GI ENDOSCOPY  03/05/2017   MAC   Patient Active Problem List   Diagnosis Date Noted   Obesity hypoventilation syndrome (HCC) 02/01/2021   Morbid obesity with BMI of 40.0-44.9, adult (HCC) 02/01/2021   Displacement of lumbar intervertebral disc without myelopathy 05/19/2019   Lumbosacral spondylosis without myelopathy 05/05/2019   Trochanteric bursitis of left hip 04/02/2019   Pain of left hip joint 04/01/2019   Adhesive capsulitis of left shoulder 03/12/2019   Pain of left hand 04/23/2018   Low back pain 02/06/2018   Nocturnal hypoxemia 01/24/2018   Insomnia secondary to chronic pain 01/24/2018   OAG (open angle glaucoma) suspect, high risk, bilateral 01/03/2018   Abdominal pain, epigastric 02/28/2017   S/P TAH-BSO (total abdominal hysterectomy and bilateral salpingo-oophorectomy) 01/09/2017   Bloating 10/12/2016   Generalized abdominal pain 10/12/2016   Nausea without vomiting 10/12/2016   Essential hypertension 01/26/2016   Atypical chest pain 01/26/2016   OSA on CPAP 01/18/2016   Hypoxemia 01/18/2016   Exudative age-related macular degeneration, left eye, with active choroidal neovascularization (HCC) 10/11/2015   Cystoid macular edema of left eye 09/09/2015   Fundus coloboma, bilateral 09/09/2015   Asthma, chronic 08/04/2015   Obesity,  morbid (HCC) 08/04/2015   Respiratory failure with hypoxia and hypercapnia (HCC) 08/04/2015   Chronic cough 05/22/2012   ULCERATIVE COLITIS, UNIVERSAL 04/10/2008   ANEMIA, IRON DEFICIENCY, HX OF 04/10/2008   Diaphragmatic hernia 05/16/2007    PCP: Shayne Anes, MD   REFERRING PROVIDER: Arnaldo Juliene RAMAN, MD  REFERRING DIAG: [phone] (ICD-10-CM) - Pain in right knee  THERAPY DIAG:  Other symptoms and signs involving the musculoskeletal system  Stiffness of left knee, not elsewhere classified  Chronic pain of right knee  Muscle weakness (generalized)  Rationale for Evaluation and Treatment: Rehabilitation  ONSET  DATE: chronic   SUBJECTIVE:   SUBJECTIVE STATEMENT:  My knee and my shoulder are both really hurting today. Knee was hurting since last time, so I massaged it and it popped and felt better for maybe 3-4 minutes then hurt again. The more I walk on it, the more it hurts. I was pulling myself up on a step and that's when the knee started getting worse.   Eval: Went to [place] and [place] about 23 days ago (walked 24K steps a day), had shoulder pain before the trip, knee is not feeling good now and had a fall last night too getting my foot caught on something in the kitchen. Usually end up falling on the knee. Have severe osteoporosis, have been getting injections for awhile.   PERTINENT HISTORY: See above  PAIN:  Are you having pain? Yes: NPRS scale: 4-5/10 R knee, 6/10 R shoulder  Pain location: above  Pain description: R knee general discomfort and some popping like kneecap does not move with my leg and they're not aligned; R shoulder is throbbing  Aggravating factors: knee: falling on it, moving knee after its been stiff for awhile; shoulder overdoing   Relieving factors: movement   PRECAUTIONS: Fall and Other: legally blind   RED FLAGS: None   WEIGHT BEARING RESTRICTIONS: No  FALLS:  Has patient fallen in last 6 months? Yes. Number of falls 3, FOF (+)  LIVING ENVIRONMENT: Lives with: lives alone Lives in: House/apartment   OCCUPATION: retired  PLOF: Independent, Independent with basic ADLs, Independent with gait, and Independent with transfers  PATIENT GOALS: less knee stiffness, less night pain, stay mobile   NEXT MD VISIT: PRN with referring   OBJECTIVE:  Note: Objective measures were completed at Evaluation unless otherwise noted.   PATIENT SURVEYS:  PSFS: THE PATIENT SPECIFIC FUNCTIONAL SCALE  Place score of 0-10 (0 = unable to perform activity and 10 = able to perform activity at the same level as before injury or problem)  Activity Date: 11/11/24 Eval   12/08/24   Being in one position for a long time  3 3   2. Steps  5 5   3.     4.      Total Score 4 4     Total Score = Sum of activity scores/number of activities  Minimally Detectable Change: 3 points (for single activity); 2 points (for average score)  [facility] (nd). The Patient Specific Functional Scale . Retrieved from [email or web address]   COGNITION: Overall cognitive status: Within functional limits for tasks assessed      MUSCLE LENGTH:  B patellas hypomobile, L>R  HS WNL Piriformis 40% limitation B    12/08/24  Ongoing patellar hypomobility    LOWER EXTREMITY ROM:  Active ROM Right eval Left eval R 12/19 L 12/19  Hip flexion      Hip extension      Hip  abduction      Hip adduction      Hip internal rotation      Hip external rotation      Knee flexion -2* 3* 129 123  Knee extension 122* 108* 3deg hyperextension 3deg hyperextension  Ankle dorsiflexion      Ankle plantarflexion      Ankle inversion      Ankle eversion       (Blank rows = not tested)  LOWER EXTREMITY MMT:  MMT Right eval Left eval Right 12/08/24 Left 12/08/24  Hip flexion 3 3 4+ 4+  Hip extension      Hip abduction 3 3 4- 4-  Hip adduction      Hip internal rotation      Hip external rotation      Knee flexion 4+ 4+ 5 5  Knee extension 4 4- 4+ 4+  Ankle dorsiflexion      Ankle plantarflexion      Ankle inversion      Ankle eversion       (Blank rows = not tested)                                                                                                                                  TREATMENT DATE:    12/15/24  Scifit seat 1 L3x6 min for w/u (reduced time on machine and resistance due to acute knee pain)  Patella mobs, tennis ball STM medial quads to tolerance   Hip flexor stretch R 2x30 seconds Hip flexor + quad stretch R 2x30 seconds  SLRs x15 0# SLRs + ER x15 0# STS staggered  stance x5 B from mat table      12/08/24  Scifit seat 1 L4x6 minutes for w/u  Shuttle LE press 75# BLEs x12 cues to not hyperextend knees  Shuttle LE press 37# x10 B Wide tandem blue foam pad 4x30 seconds alternating   PSFS, MMT, goals, care planning   Standing hip ABD x10 3# B Standing marches x10 B 3#  Hip hikes 3# x10 B LAQs 3# x12 B        PATIENT EDUCATION:  Education details: exam findings, POC, HEP  Person educated: Patient Education method: Programmer, Multimedia, Demonstration, and Handouts Education comprehension: verbalized understanding and returned demonstration  HOME EXERCISE PROGRAM: Access Code: [email or web address] URL: [email or web address] Date: 11/11/2024 Prepared by: Josette Rough  Exercises - Supine Active Straight Leg Raise  - 1 x daily - 5 x weekly - 2 sets - 10 reps - Supine Bridge with Resistance Band  - 1 x daily - 5 x weekly - 2 sets - 5-10 reps - Seated Knee Extension with Anchored Resistance  - 1 x daily - 5 x weekly - 2 sets - 10 reps  ASSESSMENT:  CLINICAL IMPRESSION:   Arrives today doing OK, unfortunately her knee has been bothering her more since she pulled herself up a step recently. We warmed up on  the Nustep, then tried some manual to her medial quad before continuing with light LE exercises. Modified session today due to irritability and sensitivity of knee pain this visit. Continue to await MD POC regarding her shoulder.    Eval: Patient is a 73 y.o. F who was seen today for physical therapy evaluation and treatment for M25.561 (ICD-10-CM) - Pain in right knee. Objectives as above, will make every effort to improve level of function and pain.   OBJECTIVE IMPAIRMENTS: Abnormal gait, decreased activity tolerance, decreased balance, decreased mobility, difficulty walking, decreased ROM, decreased strength, increased fascial restrictions, obesity, and pain.   ACTIVITY LIMITATIONS: standing, squatting, sleeping, stairs, transfers, and  locomotion level  PARTICIPATION LIMITATIONS: meal prep, cleaning, laundry, shopping, community activity, and yard work  PERSONAL FACTORS: Age, Education, Fitness, Past/current experiences, Social background, and Time since onset of injury/illness/exacerbation are also affecting patient's functional outcome.   REHAB POTENTIAL: Good  CLINICAL DECISION MAKING: Stable/uncomplicated  EVALUATION COMPLEXITY: Low   GOALS: Goals reviewed with patient? No  SHORT TERM GOALS: Target date: 12/29/2024      Will be compliant with appropriate progressive HEP Goal status: MET 12/9   2. L knee AROM flexion to match that of the R knee without pain  Goal status: IN PROGRESS 12/22   3. Sx and pain to have improved by at least 25% Goal status: IN PROGRESS 12/22   LONG TERM GOALS: Target date: 01/19/2025      MMT to have improved by one grade all weak groups Goal status: PARTIALLY MET 12/22   2. Will score at least 20 on the DGI to show reduced fall risk Goal status: ONGOING 12/22   3. Will be able to ascend and descend stairs reciprocally with no increase in pain Goal status: ONGOING 12/22   4. Will be able to sleep through the night without increased knee pain upon waking Goal status: IN PROGRESS 12/22   5. PSFS  to have improved by at least 3  points to show improved QOL and subjective improvement  Goal status: ONGOING 12/22   PLAN:  PT FREQUENCY: 2x/week  PT DURATION: 6 weeks  PLANNED INTERVENTIONS: [id number]- PT Re-evaluation, 97750- Physical Performance Testing, 97110-Therapeutic exercises, 97530- Therapeutic activity, [id number]- Neuromuscular re-education, 97535- Self Care, [id number]- Manual therapy, [id number]- Gait training, and [phone]- Aquatic Therapy  PLAN FOR NEXT SESSION: functional strength and balance, L knee ROM.  Any updates about shoulder surgery? Need to teach self patella mobs   Josette Rough, PT, DPT 12/15/2024 3:11 PM      Date of referral: 09/19/24 Referring  provider: Arnaldo Juliene RAMAN, MD Referring diagnosis? M25.561 (ICD-10-CM) - Pain in right knee Treatment diagnosis? (if different than referring diagnosis)   M25.561, G89.29, M25.662, M62.81, R29.898  What was this (referring dx) caused by? Ongoing Issue  Lysle of Condition: Chronic (continuous duration > 3 months)   Laterality: Rt  Current Functional Measure Score: Patient Specific Functional Scale 4  Objective measurements identify impairments when they are compared to normal values, the uninvolved extremity, and prior level of function.  [x]  Yes  []  No  Objective assessment of functional ability: Minimal functional limitations   Briefly describe symptoms: ongoing chronic pain in R knee   How did symptoms start: over time   Average pain intensity:  Last 24 hours: 4/10  Past week: 4/10  How often does the pt experience symptoms? Frequently  How much have the symptoms interfered with usual daily activities? A little bit  How has condition changed since  care began at this facility? NA - initial visit  In general, how is the patients overall health? Very Good   BACK PAIN (STarT Back Screening Tool) No  "

## 2024-12-25 ENCOUNTER — Ambulatory Visit (HOSPITAL_BASED_OUTPATIENT_CLINIC_OR_DEPARTMENT_OTHER)

## 2025-01-14 ENCOUNTER — Ambulatory Visit: Payer: Medicare Other | Admitting: Adult Health

## 2025-01-15 ENCOUNTER — Other Ambulatory Visit: Payer: Self-pay | Admitting: Gastroenterology

## 2025-01-18 ENCOUNTER — Telehealth: Payer: Self-pay | Admitting: Adult Health

## 2025-01-18 NOTE — Telephone Encounter (Signed)
 Office will open late 2/3 due to weather.

## 2025-01-20 ENCOUNTER — Ambulatory Visit: Admitting: Adult Health

## 2025-01-21 NOTE — Progress Notes (Unsigned)
" °  Setup date 11/25/2015  "

## 2025-01-22 ENCOUNTER — Encounter: Payer: Self-pay | Admitting: Adult Health

## 2025-01-22 ENCOUNTER — Ambulatory Visit: Admitting: Adult Health

## 2025-01-22 VITALS — BP 139/74 | HR 85 | Ht <= 58 in | Wt 197.4 lb

## 2025-01-22 DIAGNOSIS — G4733 Obstructive sleep apnea (adult) (pediatric): Secondary | ICD-10-CM | POA: Diagnosis not present

## 2025-01-22 NOTE — Progress Notes (Signed)
 "   PATIENT: Shawna Gonzalez DOB: 06/07/1951  REASON FOR VISIT: follow up HISTORY FROM: patient PRIMARY NEUROLOGIST: Dr. Chalice  Chief Complaint  Patient presents with   Follow-up    Patient in room 18 alone.  Patient is here for cpap follow up. Patient is currently stable with machine.  ESS score 1 FSS score 24     HISTORY OF PRESENT ILLNESS: Today 01/22/25:  ELSIE Gonzalez is a 74 y.o. female with a history of obstructive sleep apnea on CPAP. Returns today for follow-up.  She reports that the CPAP continues to work well for her.  She wears a fullface mask.  Her last sleep study was in September 2016 and showed severe sleep apnea.  She states that she does wake up frequently at night.  Reports that her primary care gave her sleep medicine although she does not recall the name of the medicine.  She reports that she has never tried it.  Her download is below.     01/28/24: Shawna Gonzalez is a 74 y.o. female with a history of OSA on CPAP. Returns today for follow-up.  She reports that the CPAP is doing well.  She denies any new issues.  She states that she gets anywhere from 4 to 7 hours of sleep at night.  Typically does not go to bed till 1 AM and gets up around 7 AM.  Her download is below     02/07/23: Shawna Gonzalez is a 74 y.o. female with a history of OSA on CPAP. Returns today for follow-up.  She reports that the CPAP works well.  She does not always get good sleep.  She states that she has a hard time shutting her brain off. Has tried melatonin but does not feel that it has worked.  She states that she does not always have a regular bedtime routine.  Tends to look at her phone computer or TV at bedtime.  Download is below      02/01/22: Shawna Gonzalez is a 74 year old female with a history of OSA on CPAP. She returns today for follow-up. CPAP is working well. Mask does make a mark on her face but doesn't want to change mask at this time.   REVIEW OF  SYSTEMS: Out of a complete 14 system review of symptoms, the patient complains only of the following symptoms, and all other reviewed systems are negative.  ESS 0   ALLERGIES: Allergies  Allergen Reactions   Amlodipine Cough   Aspirin     Other Reaction(s): stopped 2018 due to gi  issues but not sure that stopping it made any real difference.   Bydureon [Exenatide] Itching and Other (See Comments)    KNOTS IN SKIN   Lisinopril Cough   Lunesta [Eszopiclone] Other (See Comments)    Metallic taste in mouth   Zoledronic  Acid     Other Reaction(s): suspected ONJ issues 7/17   Sulfa Antibiotics Rash    HOME MEDICATIONS: Outpatient Medications Prior to Visit  Medication Sig Dispense Refill   albuterol  (PROVENTIL  HFA;VENTOLIN  HFA) 108 (90 BASE) MCG/ACT inhaler Inhale 2 puffs into the lungs every 6 (six) hours as needed for wheezing or shortness of breath.     budesonide -formoterol  (SYMBICORT ) 160-4.5 MCG/ACT inhaler Inhale 2 puffs into the lungs daily. 1 each 5   dicyclomine  (BENTYL ) 10 MG capsule TAKE 1 CAPSULE(10 MG) BY MOUTH FOUR TIMES DAILY BEFORE MEALS AND AT BEDTIME 90 capsule 6   dorzolamide  (TRUSOPT ) 2 %  ophthalmic solution INSTILL 1 DROP INTO BOTH EYES THREE TIMES DAILY 30 mL 0   esomeprazole  (NEXIUM ) 40 MG capsule Take 1 capsule (40 mg total) by mouth 2 (two) times daily. 60 capsule 6   famotidine  (PEPCID ) 20 MG tablet Take 1 tablet (20 mg total) by mouth at bedtime. 30 tablet 3   furosemide (LASIX) 40 MG tablet Take 40 mg by mouth every evening. Takes about 3 times per week  4   HYDROcodone bit-homatropine (HYCODAN) 5-1.5 MG/5ML syrup      levocetirizine (XYZAL) 5 MG tablet Take 5 mg by mouth every evening.     losartan  (COZAAR ) 25 MG tablet Take 50 mg by mouth daily.     Multiple Vitamin (MULTIVITAMIN PO) Take by mouth. gummy     NEOMYCIN -POLYMYXIN-HYDROCORTISONE (CORTISPORIN) 1 % SOLN OTIC solution Apply 1-2 drops to toe BID after soaking 10 mL 1   ondansetron  (ZOFRAN -ODT) 4  MG disintegrating tablet DISSOLVE 1 TABLET(4 MG) ON THE TONGUE DAILY AS NEEDED FOR NAUSEA OR VOMITING 30 tablet 0   traMADol  (ULTRAM ) 50 MG tablet Take 1 tablet (50 mg total) by mouth every 6 (six) hours as needed for moderate pain. 30 tablet 0   Travoprost, BAK Free, (TRAVATAN) 0.004 % SOLN ophthalmic solution PLACE 1 DROP INTO BOTH EYES NIGHTLY     triamcinolone cream (KENALOG) 0.1 % as needed (rash).     TURMERIC PO Take by mouth. gummy     TYMLOS 3120 MCG/1.56ML SOPN Inject into the skin daily. Every 2 years DEXA     Vitamin D, Ergocalciferol, (DRISDOL) 50000 UNITS CAPS Take 50,000 Units by mouth 2 (two) times a week. Sunday & Thursday, 2-3 times per week     No facility-administered medications prior to visit.    PAST MEDICAL HISTORY: Past Medical History:  Diagnosis Date   Adenomatous colon polyp    Anemia    history of   Arthritis    Asthma    Atypical chest pain    Bacterial ear infection 2018   Cancer (HCC)    melanoma skin cancer 7 years ago   Cataract    Diabetes mellitus without complication (HCC)    history of taking diabetic medication, has lost weight no longer needing medciation at this time   GERD (gastroesophageal reflux disease)    Glaucoma    Heart murmur    slight   Hypertension    Macular degeneration    Osteoporosis    Sleep apnea    cpap   Ulcerative colitis, chronic (HCC)     PAST SURGICAL HISTORY: Past Surgical History:  Procedure Laterality Date   CARPAL TUNNEL RELEASE Right 12/16/2009   CATARACT EXTRACTION W/ INTRAOCULAR LENS  IMPLANT, BILATERAL Bilateral 05/21/2007(Right); 06/04/2007(Left)   CHOLECYSTECTOMY  2000   COLONOSCOPY WITH PROPOFOL   01/23/2012   DE QUERVAIN'S RELEASE Right 11/30/2005   HYSTERECTOMY ABDOMINAL WITH SALPINGECTOMY Bilateral 01/09/2017   Procedure: HYSTERECTOMY ABDOMINAL WITH SALPINGOOPHORECTOMY;  Surgeon: Rosaline Cobble, MD;  Location: [facility];  Service: Gynecology;  Laterality: Bilateral;   TONSILLECTOMY     TRIGGER  FINGER RELEASE Right    UPPER GI ENDOSCOPY  03/05/2017   MAC    FAMILY HISTORY: Family History  Problem Relation Age of Onset   Colon polyps Mother    Heart disease Mother    COPD Mother        early signs of   Colon polyps Father    Heart disease Father    Colon polyps Brother    Sleep  apnea Nephew    Colon cancer Neg Hx    Esophageal cancer Neg Hx    Stomach cancer Neg Hx    Rectal cancer Neg Hx     SOCIAL HISTORY: Social History   Socioeconomic History   Marital status: Single    Spouse name: Not on file   Number of children: 0   Years of education: 16   Highest education level: Not on file  Occupational History   Occupation: customer service    Employer: [employer]  Tobacco Use   Smoking status: Never   Smokeless tobacco: Never  Vaping Use   Vaping status: Never Used  Substance and Sexual Activity   Alcohol use: No   Drug use: No   Sexual activity: Not on file  Other Topics Concern   Not on file  Social History Narrative   Lives alone    Caffeine use: Diet and caffeine free soda (1-2 drinks per day)   Patient is currently retired.    Epworth Sleepiness Scale = 7 (as of 01/26/2016)   Social Drivers of Health   Tobacco Use: Low Risk (01/22/2025)   Patient History    Smoking Tobacco Use: Never    Smokeless Tobacco Use: Never    Passive Exposure: Not on file  Financial Resource Strain: Not on file  Food Insecurity: Not on file  Transportation Needs: Not on file  Physical Activity: Not on file  Stress: Not on file  Social Connections: Not on file  Intimate Partner Violence: Not on file  Depression (EYV7-0): Not on file  Alcohol Screen: Not on file  Housing: Unknown (03/06/2024)   Received from [facility]   Epic    Unable to Pay for Housing in the Last Year: Not on file    Number of Times Moved in the Last Year: Not on file    At any time in the past 12 months, were you homeless or living in a shelter (including now)?: No  Utilities:  Not on file  Health Literacy: Not on file      PHYSICAL EXAM  Vitals:   01/22/25 0835  BP: 139/74  Pulse: 85  Weight: 197 lb 6.4 oz (89.5 kg)  Height: 4' 10 (1.473 m)    Body mass index is 41.26 kg/m.  Generalized: Well developed, in no acute distress  Chest: Lungs clear to auscultation bilaterally  Neurological examination  Mentation: Alert oriented to time, place, history taking. Follows all commands speech and language fluent Cranial nerve II-XII: Head turning and shoulder shrug  were normal and symmetric. Motor: The motor testing reveals 5 over 5 strength of all 4 extremities. Good symmetric motor tone is noted throughout.  Sensory: Sensory testing is intact to soft touch on all 4 extremities. No evidence of extinction is noted.  Gait and station: Gait is normal.    DIAGNOSTIC DATA (LABS, IMAGING, TESTING) - I reviewed patient records, labs, notes, testing and imaging myself where available.  Lab Results  Component Value Date   WBC 8.7 08/15/2023   HGB 15.0 08/15/2023   HCT 45.6 08/15/2023   MCV 85.8 08/15/2023   PLT 221.0 08/15/2023      Component Value Date/Time   NA 137 08/15/2023 1411   NA 138 07/07/2022 1205   K 3.4 (L) 08/15/2023 1411   CL 98 08/15/2023 1411   CO2 29 08/15/2023 1411   GLUCOSE 93 08/15/2023 1411   BUN 14 08/15/2023 1411   BUN 13 07/07/2022 1205   CREATININE  0.63 08/15/2023 1411   CALCIUM 9.4 08/15/2023 1411   PROT 7.6 08/15/2023 1411   PROT 6.9 07/07/2022 1205   ALBUMIN 4.2 08/15/2023 1411   ALBUMIN 4.2 07/07/2022 1205   AST 18 08/15/2023 1411   ALT 17 08/15/2023 1411   ALKPHOS 129 (H) 08/15/2023 1411   BILITOT 1.0 08/15/2023 1411   BILITOT 0.4 07/07/2022 1205   GFRNONAA >60 01/09/2017 1137   GFRAA >60 01/09/2017 1137    Lab Results  Component Value Date   VITAMINB12 376 01/16/2007       ASSESSMENT AND PLAN 75 y.o. year old female  has a past medical history of Adenomatous colon polyp, Anemia, Arthritis, Asthma,  Atypical chest pain, Bacterial ear infection (2018), Cancer (HCC), Cataract, Diabetes mellitus without complication (HCC), GERD (gastroesophageal reflux disease), Glaucoma, Heart murmur, Hypertension, Macular degeneration, Osteoporosis, Sleep apnea, and Ulcerative colitis, chronic (HCC). here with:  OSA on CPAP  - CPAP compliance excellent - Good treatment of AHI  - Encourage patient to use CPAP nightly and > 4 hours each night - F/U in 1 year or sooner if needed    Duwaine Russell, MSN, NP-C 01/22/2025, 8:53 AM [facility] [address], [address] [place], [place] [address] [phone]     "

## 2025-01-22 NOTE — Patient Instructions (Signed)
 Continue using CPAP nightly and greater than 4 hours each night If your symptoms worsen or you develop new symptoms please let us  know.

## 2026-01-25 ENCOUNTER — Ambulatory Visit: Admitting: Adult Health
